# Patient Record
Sex: Female | Born: 1945
Health system: Southern US, Community
[De-identification: ages and names within clinical notes are randomized; demographics above are authoritative.]

## PROBLEM LIST (undated history)

## (undated) DIAGNOSIS — M1990 Unspecified osteoarthritis, unspecified site: Secondary | ICD-10-CM

## (undated) DIAGNOSIS — J439 Emphysema, unspecified: Secondary | ICD-10-CM

## (undated) DIAGNOSIS — T7840XA Allergy, unspecified, initial encounter: Secondary | ICD-10-CM

## (undated) DIAGNOSIS — K219 Gastro-esophageal reflux disease without esophagitis: Secondary | ICD-10-CM

## (undated) DIAGNOSIS — C801 Malignant (primary) neoplasm, unspecified: Secondary | ICD-10-CM

## (undated) DIAGNOSIS — D649 Anemia, unspecified: Secondary | ICD-10-CM

## (undated) DIAGNOSIS — E785 Hyperlipidemia, unspecified: Secondary | ICD-10-CM

## (undated) DIAGNOSIS — I10 Essential (primary) hypertension: Secondary | ICD-10-CM

## (undated) HISTORY — DX: Emphysema, unspecified: J43.9

## (undated) HISTORY — DX: Allergy, unspecified, initial encounter: T78.40XA

## (undated) HISTORY — DX: Essential (primary) hypertension: I10

## (undated) HISTORY — DX: Hyperlipidemia, unspecified: E78.5

## (undated) HISTORY — PX: COLONOSCOPY: SHX174

## (undated) HISTORY — DX: Gastro-esophageal reflux disease without esophagitis: K21.9

## (undated) HISTORY — PX: OTHER SURGICAL HISTORY: SHX169

---

## 2005-10-19 ENCOUNTER — Other Ambulatory Visit: Payer: Self-pay

## 2005-10-19 ENCOUNTER — Emergency Department: Payer: Self-pay | Admitting: Emergency Medicine

## 2005-10-30 ENCOUNTER — Ambulatory Visit: Payer: Self-pay | Admitting: Family Medicine

## 2005-11-25 ENCOUNTER — Ambulatory Visit: Payer: Self-pay

## 2005-12-10 ENCOUNTER — Ambulatory Visit: Payer: Self-pay | Admitting: Family Medicine

## 2005-12-26 ENCOUNTER — Ambulatory Visit: Payer: Self-pay | Admitting: Family Medicine

## 2006-01-24 ENCOUNTER — Ambulatory Visit: Payer: Self-pay | Admitting: Unknown Physician Specialty

## 2006-06-06 ENCOUNTER — Ambulatory Visit: Payer: Self-pay | Admitting: Unknown Physician Specialty

## 2006-06-26 ENCOUNTER — Ambulatory Visit: Payer: Self-pay | Admitting: Unknown Physician Specialty

## 2007-06-28 ENCOUNTER — Ambulatory Visit: Payer: Self-pay | Admitting: Family Medicine

## 2007-09-06 ENCOUNTER — Emergency Department: Payer: Self-pay | Admitting: Emergency Medicine

## 2007-09-06 ENCOUNTER — Other Ambulatory Visit: Payer: Self-pay

## 2011-12-22 LAB — HM COLONOSCOPY: HM COLON: NORMAL

## 2012-02-03 ENCOUNTER — Ambulatory Visit: Payer: Self-pay | Admitting: Family Medicine

## 2012-03-22 LAB — HM PAP SMEAR: HM PAP: NORMAL

## 2012-03-22 LAB — HM MAMMOGRAPHY: HM MAMMO: NORMAL

## 2012-03-24 ENCOUNTER — Ambulatory Visit: Payer: Self-pay | Admitting: Unknown Physician Specialty

## 2013-05-31 ENCOUNTER — Ambulatory Visit: Payer: Self-pay | Admitting: Otolaryngology

## 2013-12-13 ENCOUNTER — Ambulatory Visit: Payer: Self-pay | Admitting: Family Medicine

## 2014-10-18 ENCOUNTER — Other Ambulatory Visit: Payer: Self-pay | Admitting: Family Medicine

## 2014-11-16 ENCOUNTER — Other Ambulatory Visit: Payer: Self-pay | Admitting: Family Medicine

## 2014-11-21 ENCOUNTER — Other Ambulatory Visit: Payer: Self-pay | Admitting: Family Medicine

## 2015-03-08 ENCOUNTER — Telehealth: Payer: Self-pay | Admitting: Family Medicine

## 2015-03-08 NOTE — Telephone Encounter (Signed)
Pt called need a referral to  [facility]   Dr  Portfolio   Appt. Wednesday   23rd at  8:00

## 2015-03-08 NOTE — Telephone Encounter (Signed)
2 referral entered 1 for today [facility]) and 1 for [place] on 03/15/15 auth # [id number]

## 2015-03-08 NOTE — Telephone Encounter (Signed)
Pt states she need a referral for today for  [employer]  @ 1:45  Today. Pt call back # is  [phone]

## 2015-03-08 NOTE — Telephone Encounter (Signed)
Referral done

## 2015-04-28 ENCOUNTER — Encounter: Payer: Self-pay | Admitting: Family Medicine

## 2015-04-28 ENCOUNTER — Ambulatory Visit (INDEPENDENT_AMBULATORY_CARE_PROVIDER_SITE_OTHER): Payer: Self-pay | Admitting: Family Medicine

## 2015-04-28 VITALS — BP 138/74 | HR 80 | Temp 97.6°F | Resp 16 | Ht 64.0 in | Wt 132.0 lb

## 2015-04-28 DIAGNOSIS — K219 Gastro-esophageal reflux disease without esophagitis: Secondary | ICD-10-CM | POA: Insufficient documentation

## 2015-04-28 DIAGNOSIS — R1011 Right upper quadrant pain: Secondary | ICD-10-CM

## 2015-04-28 DIAGNOSIS — R1013 Epigastric pain: Secondary | ICD-10-CM

## 2015-04-28 MED ORDER — DICYCLOMINE HCL 20 MG PO TABS: 20.0000 mg | ORAL_TABLET | Freq: Three times a day (TID) | ORAL | Status: DC

## 2015-04-28 MED ORDER — RANITIDINE HCL 150 MG PO CAPS: 150.0000 mg | ORAL_CAPSULE | Freq: Two times a day (BID) | ORAL | Status: DC

## 2015-04-28 NOTE — Patient Instructions (Signed)
I think your stomach cramps might be IBS related. Let's try Bentyl for your pain. Start with 1 at bedtime. It might make you sleepy.   Let's also try increasing your acid reflux medication. Take Zantac twice daily.   If you have severe abdominal pain, nausea, vomiting, or chest pain.

## 2015-04-28 NOTE — Assessment & Plan Note (Signed)
Likely IBS. R/o Gallbladder or other issues. Bentyl PRN. CMET, CBC, amylase, lipase.  Alarm symptoms reviewed. RTC 3-4 weeks.
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
## 2015-04-28 NOTE — Assessment & Plan Note (Signed)
GERD vs. IBS related pain. R/o cardiac with EKG. Add Zantac BID for GERD coverage. Check CMET, CBC.

## 2015-04-29 LAB — CBC WITH DIFFERENTIAL/PLATELET
BASOS ABS: 0.1 10*3/uL (ref 0.0–0.2)
Basos: 1 %
EOS (ABSOLUTE): 0.1 10*3/uL (ref 0.0–0.4)
Eos: 1 %
Hematocrit: 42.8 % (ref 34.0–46.6)
Hemoglobin: 14.1 g/dL (ref 11.1–15.9)
IMMATURE GRANULOCYTES: 0 %
Immature Grans (Abs): 0 10*3/uL (ref 0.0–0.1)
Lymphocytes Absolute: 2.5 10*3/uL (ref 0.7–3.1)
Lymphs: 22 %
MCH: 30.3 pg (ref 26.6–33.0)
MCHC: 32.9 g/dL (ref 31.5–35.7)
MCV: 92 fL (ref 79–97)
MONOCYTES: 6 %
MONOS ABS: 0.7 10*3/uL (ref 0.1–0.9)
Neutrophils Absolute: 8.1 10*3/uL — ABNORMAL HIGH (ref 1.4–7.0)
Neutrophils: 70 %
Platelets: 649 10*3/uL — ABNORMAL HIGH (ref 150–379)
RBC: 4.65 x10E6/uL (ref 3.77–5.28)
RDW: 14.2 % (ref 12.3–15.4)
WBC: 11.4 10*3/uL — ABNORMAL HIGH (ref 3.4–10.8)

## 2015-04-29 LAB — COMPREHENSIVE METABOLIC PANEL
ALBUMIN: 4.4 g/dL (ref 3.6–4.8)
ALT: 15 IU/L (ref 0–32)
AST: 22 IU/L (ref 0–40)
Albumin/Globulin Ratio: 1.8 (ref 1.1–2.5)
Alkaline Phosphatase: 138 IU/L — ABNORMAL HIGH (ref 39–117)
BUN / CREAT RATIO: 11 (ref 11–26)
BUN: 9 mg/dL (ref 8–27)
Bilirubin Total: 0.6 mg/dL (ref 0.0–1.2)
CALCIUM: 9.3 mg/dL (ref 8.7–10.3)
CO2: 25 mmol/L (ref 18–29)
CREATININE: 0.82 mg/dL (ref 0.57–1.00)
Chloride: 102 mmol/L (ref 96–106)
GFR, EST AFRICAN AMERICAN: 85 mL/min/{1.73_m2} (ref >59)
GFR, EST NON AFRICAN AMERICAN: 74 mL/min/{1.73_m2} (ref >59)
GLUCOSE: 84 mg/dL (ref 65–99)
Globulin, Total: 2.4 g/dL (ref 1.5–4.5)
Potassium: 5.1 mmol/L (ref 3.5–5.2)
Sodium: 146 mmol/L — ABNORMAL HIGH (ref 134–144)
TOTAL PROTEIN: 6.8 g/dL (ref 6.0–8.5)

## 2015-04-29 LAB — AMYLASE: AMYLASE: 73 U/L (ref 31–124)

## 2015-04-29 LAB — LIPASE: Lipase: 25 U/L (ref 0–59)

## 2015-05-02 ENCOUNTER — Telehealth: Payer: Self-pay | Admitting: Family Medicine

## 2015-05-02 DIAGNOSIS — R748 Abnormal levels of other serum enzymes: Secondary | ICD-10-CM
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
## 2015-05-05 LAB — SPECIMEN STATUS REPORT

## 2015-05-05 LAB — GAMMA GT: GGT: 12 IU/L (ref 0–60)

## 2015-05-15 ENCOUNTER — Telehealth: Payer: Self-pay | Admitting: Family Medicine

## 2015-05-15 DIAGNOSIS — R109 Unspecified abdominal pain: Secondary | ICD-10-CM

## 2015-05-15 DIAGNOSIS — R197 Diarrhea, unspecified: Secondary | ICD-10-CM

## 2015-05-15 NOTE — Telephone Encounter (Signed)
Spoke with patient. We will refer her to [facility] gastro. Bentyl showed mild improvement in stomach pains but she has noticed an increase over past few days and stools have become loose. Due to husband's recent death of colon cancer, she would like to get it checked out.

## 2015-05-15 NOTE — Telephone Encounter (Signed)
Pt.called states that the medications that was given to her at her last appt. For stomach pain was not working, she wanted to know if you would do a referral to a GI Dr., pt also states that she have had an upset stomach for 3-4 days. Pt call back # is  [phone]

## 2015-05-23 ENCOUNTER — Ambulatory Visit (INDEPENDENT_AMBULATORY_CARE_PROVIDER_SITE_OTHER): Payer: Commercial Managed Care - HMO | Admitting: Family Medicine

## 2015-05-23 VITALS — BP 137/86 | HR 85 | Temp 98.7°F | Resp 16 | Ht 64.0 in | Wt 131.0 lb

## 2015-05-23 DIAGNOSIS — D72829 Elevated white blood cell count, unspecified: Secondary | ICD-10-CM

## 2015-05-23 DIAGNOSIS — J0110 Acute frontal sinusitis, unspecified: Secondary | ICD-10-CM

## 2015-05-23 DIAGNOSIS — J069 Acute upper respiratory infection, unspecified: Secondary | ICD-10-CM

## 2015-05-23 DIAGNOSIS — R7401 Elevation of levels of liver transaminase levels: Secondary | ICD-10-CM

## 2015-05-23 DIAGNOSIS — R740 Nonspecific elevation of levels of transaminase and lactic acid dehydrogenase [LDH]: Secondary | ICD-10-CM

## 2015-05-23 MED ORDER — DM-GUAIFENESIN ER 30-600 MG PO TB12: 1.0000 | ORAL_TABLET | Freq: Two times a day (BID) | ORAL | Status: DC

## 2015-05-23 MED ORDER — BENZONATATE 100 MG PO CAPS: 100.0000 mg | ORAL_CAPSULE | Freq: Three times a day (TID) | ORAL | Status: DC | PRN

## 2015-05-23 MED ORDER — OXYMETAZOLINE HCL 0.05 % NA SOLN: 1.0000 | Freq: Two times a day (BID) | NASAL | Status: DC

## 2015-05-23 MED ORDER — SALINE SPRAY 0.65 % NA SOLN: 1.0000 | NASAL | Status: DC | PRN

## 2015-05-23 MED ORDER — AMOXICILLIN-POT CLAVULANATE 875-125 MG PO TABS: 1.0000 | ORAL_TABLET | Freq: Two times a day (BID) | ORAL | Status: DC

## 2015-05-23 NOTE — Patient Instructions (Addendum)
Your symptoms are consistent with a viral upper respiratory infection. At this time there is no need for antibiotics.  If your symptoms persist for > 10 days or get better and than worsen please let me know. You may have a secondary bacterial infection. Please fill prescription for Augmentin on Saturday if your symptoms do not improve or if severe symptoms continue through Thursday.   You can use supportive care at home to help with your symptoms. I have sent Mucinex DM to your pharmacy to help break up the congestion and soothe your cough. You can takes this twice daily.  I have also sent tesslon perles to your pharmacy to help with the cough- you can take these 3 times daily as needed. Honey is a natural cough suppressant- so add it to your tea in the morning.  If you have a humidifer, set that up in your bedroom at night.
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
## 2015-05-29 ENCOUNTER — Ambulatory Visit: Payer: Self-pay | Admitting: Family Medicine

## 2015-05-30 LAB — HEPATIC FUNCTION PANEL
ALT: 13 IU/L (ref 0–32)
AST: 17 IU/L (ref 0–40)
Albumin: 3.7 g/dL (ref 3.6–4.8)
Alkaline Phosphatase: 133 IU/L — ABNORMAL HIGH (ref 39–117)
Bilirubin Total: <0.2 mg/dL (ref 0.0–1.2)
Bilirubin, Direct: 0.08 mg/dL (ref 0.00–0.40)
Total Protein: 6.4 g/dL (ref 6.0–8.5)

## 2015-05-30 LAB — CBC WITH DIFFERENTIAL/PLATELET
Basophils Absolute: 0.1 10*3/uL (ref 0.0–0.2)
Basos: 1 %
EOS (ABSOLUTE): 0.3 10*3/uL (ref 0.0–0.4)
EOS: 3 %
HEMATOCRIT: 39 % (ref 34.0–46.6)
Hemoglobin: 13 g/dL (ref 11.1–15.9)
IMMATURE GRANULOCYTES: 0 %
Immature Grans (Abs): 0 10*3/uL (ref 0.0–0.1)
LYMPHS: 37 %
Lymphocytes Absolute: 3.5 10*3/uL — ABNORMAL HIGH (ref 0.7–3.1)
MCH: 30 pg (ref 26.6–33.0)
MCHC: 33.3 g/dL (ref 31.5–35.7)
MCV: 90 fL (ref 79–97)
MONOS ABS: 0.7 10*3/uL (ref 0.1–0.9)
Monocytes: 7 %
NEUTROS PCT: 52 %
Neutrophils Absolute: 5.1 10*3/uL (ref 1.4–7.0)
Platelets: 684 10*3/uL — ABNORMAL HIGH (ref 150–379)
RBC: 4.34 x10E6/uL (ref 3.77–5.28)
RDW: 13.6 % (ref 12.3–15.4)
WBC: 9.6 10*3/uL (ref 3.4–10.8)

## 2015-06-01 ENCOUNTER — Telehealth: Payer: Self-pay | Admitting: Family Medicine

## 2015-06-01 NOTE — Telephone Encounter (Signed)
Add-on iron studies for elevated platelet count. Dx code D47.3 for thrombocytosis.

## 2015-06-07 LAB — IRON AND TIBC
Iron Saturation: 25 % (ref 15–55)
Iron: 60 ug/dL (ref 27–139)
TIBC: 237 ug/dL — AB (ref 250–450)
UIBC: 177 ug/dL (ref 118–369)

## 2015-06-07 LAB — FERRITIN: FERRITIN: 136 ng/mL (ref 15–150)

## 2015-06-07 LAB — RETICULOCYTES: Retic Ct Pct: 1.5 % (ref 0.6–2.6)

## 2015-06-07 LAB — SPECIMEN STATUS REPORT

## 2015-06-11 ENCOUNTER — Other Ambulatory Visit: Payer: Self-pay | Admitting: Family Medicine

## 2015-06-15 ENCOUNTER — Ambulatory Visit (INDEPENDENT_AMBULATORY_CARE_PROVIDER_SITE_OTHER): Payer: Commercial Managed Care - HMO | Admitting: Gastroenterology

## 2015-06-15 ENCOUNTER — Encounter: Payer: Self-pay | Admitting: Gastroenterology

## 2015-06-15 ENCOUNTER — Encounter (INDEPENDENT_AMBULATORY_CARE_PROVIDER_SITE_OTHER): Payer: Self-pay

## 2015-06-15 VITALS — BP 121/74 | HR 72 | Temp 98.3°F | Ht 64.0 in | Wt 136.0 lb

## 2015-06-15 DIAGNOSIS — K580 Irritable bowel syndrome with diarrhea: Secondary | ICD-10-CM | POA: Diagnosis not present

## 2015-06-15 NOTE — Progress Notes (Signed)
Gastroenterology Consultation  Referring Provider:     Luciana Axe, NP Primary Care Physician:  Leata Mouse, NP Primary Gastroenterologist:  Dr. Allen Norris     Reason for Consultation:     Abdominal pain        HPI:   Tiffany Richardson is a 70 y.o. y/o female referred for consultation & management of  Abdominal pain by Dr. Leata Mouse, NP.   This patient comes in today with a history of abdominal pain. She reports the pain to be on both sides and cramping pain. The patient also reports that she has diarrhea and excessive gas. She reports this is more common with certain foods she eats. The patient states that it has gotten much worse since she lost her husband to colon cancer. The patient readily cries in my office when discussing her stress level and her recent loss of her husband. She states that her abdominal pains usually occur when she is thinking about her husband or under stress. The pain typically goes away when she is preoccupied and not having thoughts about her husband and stress. She also reports that she has a daughter who is suffering from non-Hodgkin's lymphoma. There is no report of any black stools or bloody stools. The patient has had a history of colon polyps in the past but her most recent colonoscopy was three years ago and was normal. She denies any nausea or vomiting.  Past Medical History  Diagnosis Date  . Hypertension   . Emphysema of lung ([place])   . Allergy     Past Surgical History  Procedure Laterality Date  . Basal cell removed      Rt eye    Prior to Admission medications   Medication Sig Start Date End Date Taking? Authorizing Provider  dicyclomine (BENTYL) 20 MG tablet Take 1 tablet (20 mg total) by mouth 3 (three) times daily before meals. 04/28/15  Yes Amy Overton Mam, NP  fluticasone (FLONASE) 50 MCG/ACT nasal spray USE 2 SPRAYS IN EACH NOSTRIL EVERY DAY AS NEEDED (NEED A FOLLOW UP APPOINTMENT)  06/12/15  Yes Arlis Porta., MD  metoprolol succinate  (TOPROL-XL) 50 MG 24 hr tablet TAKE 1 TABLET EVERY DAY 06/12/15  Yes Arlis Porta., MD  omeprazole (PRILOSEC) 20 MG capsule TAKE 1 CAPSULE TWICE DAILY 10/18/14  Yes Arlis Porta., MD  oxymetazoline [facility] NASAL SPRAY) 0.05 % nasal spray Place 1 spray into both nostrils 2 (two) times daily. Use for 3 days only. 05/23/15  Yes Amy Overton Mam, NP  ranitidine (ZANTAC) 150 MG capsule Take 1 capsule (150 mg total) by mouth 2 (two) times daily. 04/28/15  Yes Amy Overton Mam, NP  sodium chloride (OCEAN) 0.65 % SOLN nasal spray Place 1 spray into both nostrils as needed for congestion. 05/23/15  Yes Amy Overton Mam, NP  amoxicillin-clavulanate (AUGMENTIN) 875-125 MG tablet Take 1 tablet by mouth 2 (two) times daily. Patient not taking: Reported on 06/15/2015 05/23/15   Amy Overton Mam, NP  benzonatate (TESSALON) 100 MG capsule Take 1 capsule (100 mg total) by mouth 3 (three) times daily as needed. Patient not taking: Reported on 06/15/2015 05/23/15   Amy Overton Mam, NP  dextromethorphan-guaiFENesin (MUCINEX DM) 30-600 MG 12hr tablet Take 1 tablet by mouth 2 (two) times daily. Patient not taking: Reported on 06/15/2015 05/23/15   Amy Overton Mam, NP    History reviewed. No pertinent family history.   Social History  Substance Use  Topics  . Smoking status: Never Smoker   . Smokeless tobacco: Never Used  . Alcohol Use: No    Allergies as of 06/15/2015 - Review Complete 06/15/2015  Allergen Reaction Noted  . Codeine  04/28/2015    Review of Systems:    All systems reviewed and negative except where noted in HPI.   Physical Exam:  BP 121/74 mmHg  Pulse 72  Temp(Src) 98.3 F (36.8 C) (Oral)  Ht 5\' 4"  (1.626 m)  Wt 136 lb (61.689 kg)  BMI 23.33 kg/m2 No LMP recorded. Psych:  Alert and cooperative. Normal mood and affect. General:   Alert,  Well-developed, well-nourished, pleasant and cooperative in NAD Head:  Normocephalic and atraumatic. Eyes:  Sclera clear, no icterus.    Conjunctiva pink. Ears:  Normal auditory acuity. Nose:  No deformity, discharge, or lesions. Mouth:  No deformity or lesions,oropharynx pink & moist. Neck:  Supple; no masses or thyromegaly. Lungs:  Respirations even and unlabored.  Clear throughout to auscultation.   No wheezes, crackles, or rhonchi. No acute distress. Heart:  Regular rate and rhythm; no murmurs, clicks, rubs, or gallops. Abdomen:  Normal bowel sounds.  No bruits.  Soft, non-tender and non-distended without masses, hepatosplenomegaly or hernias noted.  No guarding or rebound tenderness.  Negative Carnett sign.   Rectal:  Deferred.  Msk:  Symmetrical without gross deformities.  Good, equal movement & strength bilaterally. Pulses:  Normal pulses noted. Extremities:  No clubbing or edema.  No cyanosis. Neurologic:  Alert and oriented x3;  grossly normal neurologically. Skin:  Intact without significant lesions or rashes.  No jaundice. Lymph Nodes:  No significant cervical adenopathy. Psych:  Alert and cooperative. Normal mood and affect.  Imaging Studies: No results found.  Assessment and Plan:   Tiffany Richardson is a 70 y.o. y/o female who comes in today with symptoms of irritable bowel syndrome. The patient has no worry symptoms suggestive of any other pathology. The patient reports her symptoms to be related to her stress level and I believe the patient may benefit from and anxiolytic. The patient has been told to contact her primary care physician discussed this with them. The patient has also been told that she can take her dicyclomine up to four times a day. The patient will contact me if her symptoms do not improve or get worse. The patient has been explained the plan and agrees with it.   Note: This dictation was prepared with Dragon dictation along with smaller phrase technology. Any transcriptional errors that result from this process are unintentional.

## 2015-07-17 ENCOUNTER — Other Ambulatory Visit: Payer: Self-pay | Admitting: Family Medicine

## 2015-07-17 DIAGNOSIS — Z1231 Encounter for screening mammogram for malignant neoplasm of breast: Secondary | ICD-10-CM

## 2015-07-25 ENCOUNTER — Ambulatory Visit
Admission: RE | Admit: 2015-07-25 | Discharge: 2015-07-25 | Disposition: A | Discharge status: Home / Self Care | Payer: Commercial Managed Care - HMO | Source: Ambulatory Visit | Attending: Family Medicine | Admitting: Family Medicine

## 2015-07-25 DIAGNOSIS — Z1231 Encounter for screening mammogram for malignant neoplasm of breast: Secondary | ICD-10-CM

## 2015-07-25 HISTORY — DX: Malignant (primary) neoplasm, unspecified: C80.1

## 2015-08-24 ENCOUNTER — Telehealth: Payer: Self-pay | Admitting: Family Medicine

## 2015-08-24 NOTE — Telephone Encounter (Signed)
Tiffany Richardson with [facility] needs a [employer] referral to see Dr. Ishmael Holter today for ICD 10 code [phone].  Please fax it to [phone].  Her call back number is [phone]

## 2015-08-24 NOTE — Telephone Encounter (Signed)
Done and faxed

## 2015-08-28 ENCOUNTER — Other Ambulatory Visit: Payer: Self-pay | Admitting: Family Medicine

## 2015-10-30 ENCOUNTER — Other Ambulatory Visit: Payer: Self-pay | Admitting: Family Medicine

## 2015-10-30 NOTE — Telephone Encounter (Signed)
Send this note to Amy for her approval-jh

## 2015-10-30 NOTE — Telephone Encounter (Signed)
Pt called requesting a refill on Toprol 50mg -Xl  ----  [facility]  [employer]. Call back  # is  [phone]

## 2015-10-31 ENCOUNTER — Other Ambulatory Visit: Payer: Self-pay | Admitting: Family Medicine

## 2015-10-31 MED ORDER — METOPROLOL SUCCINATE ER 50 MG PO TB24: 50.0000 mg | ORAL_TABLET | Freq: Every day | ORAL | Status: DC

## 2015-10-31 NOTE — Telephone Encounter (Signed)
Patient aware.Tiffany Richardson 

## 2015-10-31 NOTE — Telephone Encounter (Signed)
Ms. Sholar is a Luan Pulling patient who I have seen for 2 acute visits only. I am not sure why this was sent to me. I am happy to approve the refill if [facility] is unable to do so. Thanks! AK

## 2015-10-31 NOTE — Telephone Encounter (Signed)
Done for 1 month.  Needs f/u appt.-jh

## 2015-12-06 ENCOUNTER — Telehealth: Payer: Self-pay | Admitting: Family Medicine

## 2015-12-06 NOTE — Telephone Encounter (Signed)
Pt needs a [employer] referral to be seen at [facility] to see Dr. Joana Reamer for follow up for basal cell removal.  Her call back number is [phone]

## 2015-12-07 NOTE — Telephone Encounter (Signed)
Referral will be entered. Appt 12/12/15 NPI [id number] DX: Z01.00

## 2015-12-08 ENCOUNTER — Telehealth: Payer: Self-pay | Admitting: *Deleted

## 2015-12-08 NOTE — Telephone Encounter (Signed)
[place] [id number] Valid 12/12/15-06/09/16 NPI: [id number] DX: Z01.00

## 2015-12-11 ENCOUNTER — Encounter: Payer: Self-pay | Admitting: Family Medicine

## 2015-12-11 ENCOUNTER — Other Ambulatory Visit: Payer: Self-pay | Admitting: Family Medicine

## 2015-12-11 ENCOUNTER — Ambulatory Visit (INDEPENDENT_AMBULATORY_CARE_PROVIDER_SITE_OTHER): Payer: Commercial Managed Care - HMO | Admitting: Family Medicine

## 2015-12-11 VITALS — BP 127/66 | HR 69 | Temp 98.1°F | Resp 16 | Ht 64.0 in | Wt 139.0 lb

## 2015-12-11 DIAGNOSIS — D473 Essential (hemorrhagic) thrombocythemia: Secondary | ICD-10-CM | POA: Diagnosis not present

## 2015-12-11 DIAGNOSIS — I10 Essential (primary) hypertension: Secondary | ICD-10-CM | POA: Diagnosis not present

## 2015-12-11 DIAGNOSIS — K589 Irritable bowel syndrome without diarrhea: Secondary | ICD-10-CM | POA: Insufficient documentation

## 2015-12-11 DIAGNOSIS — K219 Gastro-esophageal reflux disease without esophagitis: Secondary | ICD-10-CM | POA: Diagnosis not present

## 2015-12-11 DIAGNOSIS — R7989 Other specified abnormal findings of blood chemistry: Secondary | ICD-10-CM

## 2015-12-11 DIAGNOSIS — R1011 Right upper quadrant pain: Secondary | ICD-10-CM | POA: Diagnosis not present

## 2015-12-11 LAB — COMPLETE METABOLIC PANEL WITH GFR
ALT: 9 U/L (ref 6–29)
AST: 18 U/L (ref 10–35)
Albumin: 4 g/dL (ref 3.6–5.1)
Alkaline Phosphatase: 114 U/L (ref 33–130)
BUN: 13 mg/dL (ref 7–25)
CALCIUM: 9 mg/dL (ref 8.6–10.4)
CHLORIDE: 104 mmol/L (ref 98–110)
CO2: 28 mmol/L (ref 20–31)
Creat: 0.92 mg/dL (ref 0.50–0.99)
GFR, EST AFRICAN AMERICAN: 74 mL/min (ref >=60)
GFR, Est Non African American: 64 mL/min (ref >=60)
Glucose, Bld: 77 mg/dL (ref 65–99)
POTASSIUM: 5.1 mmol/L (ref 3.5–5.3)
Sodium: 140 mmol/L (ref 135–146)
Total Bilirubin: 0.3 mg/dL (ref 0.2–1.2)
Total Protein: 6.4 g/dL (ref 6.1–8.1)

## 2015-12-11 LAB — CBC WITH DIFFERENTIAL/PLATELET
BASOS PCT: 1 %
Basophils Absolute: 88 cells/uL (ref 0–200)
EOS ABS: 264 {cells}/uL (ref 15–500)
Eosinophils Relative: 3 %
HEMATOCRIT: 40.6 % (ref 35.0–45.0)
HEMOGLOBIN: 13.1 g/dL (ref 11.7–15.5)
LYMPHS ABS: 2992 {cells}/uL (ref 850–3900)
Lymphocytes Relative: 34 %
MCH: 29.2 pg (ref 27.0–33.0)
MCHC: 32.3 g/dL (ref 32.0–36.0)
MCV: 90.6 fL (ref 80.0–100.0)
MONO ABS: 704 {cells}/uL (ref 200–950)
MPV: 9.9 fL (ref 7.5–12.5)
Monocytes Relative: 8 %
Neutro Abs: 4752 cells/uL (ref 1500–7800)
Neutrophils Relative %: 54 %
Platelets: 560 10*3/uL — ABNORMAL HIGH (ref 140–400)
RBC: 4.48 MIL/uL (ref 3.80–5.10)
RDW: 14.6 % (ref 11.0–15.0)
WBC: 8.8 10*3/uL (ref 3.8–10.8)

## 2015-12-11 MED ORDER — METOPROLOL SUCCINATE ER 50 MG PO TB24: 50.0000 mg | ORAL_TABLET | Freq: Every day | ORAL | 3 refills | Status: DC

## 2015-12-11 MED ORDER — CULTURELLE DIGESTIVE HEALTH PO CAPS: 1.0000 | ORAL_CAPSULE | Freq: Every day | ORAL | Status: DC

## 2015-12-11 MED ORDER — DICYCLOMINE HCL 20 MG PO TABS: 20.0000 mg | ORAL_TABLET | Freq: Three times a day (TID) | ORAL | 3 refills | Status: DC

## 2015-12-11 MED ORDER — RANITIDINE HCL 150 MG PO CAPS: 150.0000 mg | ORAL_CAPSULE | Freq: Two times a day (BID) | ORAL | 3 refills | Status: DC
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
## 2015-12-11 NOTE — Assessment & Plan Note (Signed)
Trial of eliminating fodmaps, restarting bentyl, and adding pro-biotic to help with symptoms. Consider adding effexor to help control symptoms if not successful. Follow-up with GI PRN.

## 2015-12-11 NOTE — Assessment & Plan Note (Signed)
Renew metoprolol today. BP well controlled. Continue current regimen.
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
## 2015-12-14 LAB — FERRITIN: FERRITIN: 63 ng/mL (ref 20–288)

## 2015-12-15 LAB — IRON AND TIBC
%SAT: 24 % (ref 11–50)
IRON: 64 ug/dL (ref 45–160)
TIBC: 272 ug/dL (ref 250–450)
UIBC: 208 ug/dL (ref 125–400)

## 2016-01-08 ENCOUNTER — Ambulatory Visit (INDEPENDENT_AMBULATORY_CARE_PROVIDER_SITE_OTHER): Payer: Commercial Managed Care - HMO | Admitting: Family Medicine

## 2016-01-08 ENCOUNTER — Encounter: Payer: Self-pay | Admitting: Family Medicine

## 2016-01-08 VITALS — BP 126/64 | HR 73 | Temp 98.4°F | Resp 16 | Ht 64.0 in | Wt 138.0 lb

## 2016-01-08 DIAGNOSIS — K589 Irritable bowel syndrome without diarrhea: Secondary | ICD-10-CM

## 2016-01-08 DIAGNOSIS — D473 Essential (hemorrhagic) thrombocythemia: Secondary | ICD-10-CM | POA: Diagnosis not present

## 2016-01-08 DIAGNOSIS — H269 Unspecified cataract: Secondary | ICD-10-CM | POA: Diagnosis not present

## 2016-01-08 DIAGNOSIS — J0111 Acute recurrent frontal sinusitis: Secondary | ICD-10-CM | POA: Diagnosis not present

## 2016-01-08 DIAGNOSIS — R7989 Other specified abnormal findings of blood chemistry: Secondary | ICD-10-CM

## 2016-01-08 DIAGNOSIS — D75839 Thrombocytosis, unspecified: Secondary | ICD-10-CM | POA: Insufficient documentation

## 2016-01-08 MED ORDER — AMOXICILLIN-POT CLAVULANATE 875-125 MG PO TABS: 1.0000 | ORAL_TABLET | Freq: Two times a day (BID) | ORAL | 0 refills | Status: DC

## 2016-01-08 MED ORDER — DM-GUAIFENESIN ER 30-600 MG PO TB12: 1.0000 | ORAL_TABLET | Freq: Two times a day (BID) | ORAL | 0 refills | Status: DC

## 2016-01-08 NOTE — Progress Notes (Signed)
Subjective:    Patient ID: Tiffany Richardson, female    DOB: 14-Nov-1945, 70 y.o.   MRN: [id number]  HPI: Tiffany Richardson is a 70 y.o. female presenting on 01/08/2016 for Sinusitis (onset week HA runny nose no fever or chills no sore throat)   HPI  Pt presents for follow-up of IBS symptoms. They are doing better if she watches what she eats. Taking Bentyl as needed. Sinus symptoms started 1.5 weeks ago. Sinus headaches. Ear clogged R side. Frontal sinus pain. No trouble breathing. No chest tightness. Clear congestion with blood streaks. Mild relief with mucinex at home.  Declines flu shot today.   Past Medical History:  Diagnosis Date  . Allergy   . Cancer ([place])    basal cell  . Emphysema of lung ([place])   . Hypertension     Current Outpatient Prescriptions on File Prior to Visit  Medication Sig  . aspirin EC 81 MG tablet Take 81 mg by mouth.  . dicyclomine (BENTYL) 20 MG tablet Take 1 tablet (20 mg total) by mouth 3 (three) times daily before meals.  . fluticasone (FLONASE) 50 MCG/ACT nasal spray USE 2 SPRAYS IN EACH NOSTRIL EVERY DAY AS NEEDED (NEED A FOLLOW UP APPOINTMENT)   . Lactobacillus-Inulin ([place]) CAPS Take 1 capsule by mouth daily.  . metoprolol succinate (TOPROL-XL) 50 MG 24 hr tablet Take 1 tablet (50 mg total) by mouth daily. Take with or immediately following a meal.  . omeprazole (PRILOSEC) 20 MG capsule TAKE 1 CAPSULE TWICE DAILY  . ranitidine (ZANTAC) 150 MG capsule Take 1 capsule (150 mg total) by mouth 2 (two) times daily.   No current facility-administered medications on file prior to visit.     Review of Systems  Constitutional: Negative for chills and fever.  HENT: Positive for congestion, postnasal drip and sinus pressure. Negative for ear pain, sneezing and sore throat.   Respiratory: Negative for cough, chest tightness and wheezing.   Cardiovascular: Negative for chest pain and palpitations.  Gastrointestinal: Negative.  Negative for  diarrhea, nausea and vomiting.  Musculoskeletal: Positive for neck pain.  Neurological: Positive for headaches.   Per HPI unless specifically indicated above     Objective:    BP 126/64   Pulse 73   Temp 98.4 F (36.9 C) (Oral)   Resp 16   Ht 5\' 4"  (1.626 m)   Wt 138 lb (62.6 kg)   SpO2 100%   BMI 23.69 kg/m   Wt Readings from Last 3 Encounters:  01/08/16 138 lb (62.6 kg)  12/11/15 139 lb (63 kg)  06/15/15 136 lb (61.7 kg)    Physical Exam  Constitutional: She appears well-developed and well-nourished. No distress.  HENT:  Head: Normocephalic and atraumatic.  Right Ear: Hearing and tympanic membrane normal. Tympanic membrane is not erythematous and not bulging.  Left Ear: Hearing and tympanic membrane normal. Tympanic membrane is not erythematous and not bulging.  Nose: Mucosal edema and rhinorrhea present. No sinus tenderness. Right sinus exhibits frontal sinus tenderness. Right sinus exhibits no maxillary sinus tenderness. Left sinus exhibits no maxillary sinus tenderness and no frontal sinus tenderness.  Mouth/Throat: Uvula is midline and mucous membranes are normal. No uvula swelling. Posterior oropharyngeal erythema present. No posterior oropharyngeal edema.  Neck: Neck supple. No Brudzinski's sign and no Kernig's sign noted.  Cardiovascular: Normal rate, regular rhythm and normal heart sounds.   Pulmonary/Chest: Breath sounds normal. No accessory muscle usage. No tachypnea. No respiratory distress.  Abdominal: Soft.  Bowel sounds are normal. She exhibits no distension and no mass. There is no tenderness. There is no guarding.  Lymphadenopathy:    She has no cervical adenopathy.   Results for orders placed or performed in visit on 12/11/15  Iron and TIBC  Result Value Ref Range   Iron 64 45 - 160 ug/dL   UIBC 208 125 - 400 ug/dL   TIBC 272 250 - 450 ug/dL   %SAT 24 11 - 50 %  Ferritin  Result Value Ref Range   Ferritin 63 20 - 288 ng/mL      Assessment & Plan:     Problem List Items Addressed This Visit      Digestive   IBS (irritable bowel syndrome)    Pt symptoms are stable. She declined to try venlafaxine. Will continue to diet changes. Continue probiotic. PRN bentyl.         Hematopoietic and Hemostatic   Elevated platelet count [facility])    Discussed hematology referral for elevated platelets. Pt has declined at this time. Recommended pt take daily aspirin- she is amenable. Plan to check CBC at next visit- if still elevated >500- pt is amenable to seeing hematology at that time.        Other Visit Diagnoses    Acute recurrent frontal sinusitis    -  Primary   Treat for sinusitis. Augmentin BID. Supportive care at home. Alarm symptoms reviewed. Return if not improving.    Relevant Medications   dextromethorphan-guaiFENesin (MUCINEX DM) 30-600 MG 12hr tablet   amoxicillin-clavulanate (AUGMENTIN) 875-125 MG tablet   Cataract, right eye       Referral placed for [employer] portal.    Relevant Orders   Ambulatory referral to Ophthalmology      Meds ordered this encounter  Medications  . dextromethorphan-guaiFENesin (MUCINEX DM) 30-600 MG 12hr tablet    Sig: Take 1 tablet by mouth 2 (two) times daily.    Dispense:  20 tablet    Refill:  0    Order Specific Question:   Supervising Provider    Answer:   Arlis Porta [phone]  . amoxicillin-clavulanate (AUGMENTIN) 875-125 MG tablet    Sig: Take 1 tablet by mouth 2 (two) times daily.    Dispense:  14 tablet    Refill:  0    Order Specific Question:   Supervising Provider    Answer:   Arlis Porta [id number]      Follow up plan: Return in about 3 months (around 04/08/2016), or if symptoms worsen or fail to improve.

## 2016-01-08 NOTE — Patient Instructions (Addendum)

## 2016-01-08 NOTE — Assessment & Plan Note (Signed)
Pt symptoms are stable. She declined to try venlafaxine. Will continue to diet changes. Continue probiotic. PRN bentyl.

## 2016-01-08 NOTE — Assessment & Plan Note (Signed)
Discussed hematology referral for elevated platelets. Pt has declined at this time. Recommended pt take daily aspirin- she is amenable. Plan to check CBC at next visit- if still elevated >500- pt is amenable to seeing hematology at that time.

## 2016-09-23 DIAGNOSIS — R69 Illness, unspecified: Secondary | ICD-10-CM | POA: Diagnosis not present

## 2016-10-08 DIAGNOSIS — C44321 Squamous cell carcinoma of skin of nose: Secondary | ICD-10-CM | POA: Diagnosis not present

## 2016-10-08 DIAGNOSIS — L570 Actinic keratosis: Secondary | ICD-10-CM | POA: Diagnosis not present

## 2016-10-08 DIAGNOSIS — D485 Neoplasm of uncertain behavior of skin: Secondary | ICD-10-CM | POA: Diagnosis not present

## 2016-10-08 DIAGNOSIS — L821 Other seborrheic keratosis: Secondary | ICD-10-CM | POA: Diagnosis not present

## 2016-10-08 DIAGNOSIS — C44129 Squamous cell carcinoma of skin of left eyelid, including canthus: Secondary | ICD-10-CM | POA: Diagnosis not present

## 2016-10-10 ENCOUNTER — Ambulatory Visit (INDEPENDENT_AMBULATORY_CARE_PROVIDER_SITE_OTHER): Payer: Medicare HMO | Admitting: Nurse Practitioner

## 2016-10-10 ENCOUNTER — Encounter: Payer: Self-pay | Admitting: Nurse Practitioner

## 2016-10-10 VITALS — BP 122/74 | HR 72 | Temp 98.2°F | Resp 16 | Ht 64.0 in | Wt 145.0 lb

## 2016-10-10 DIAGNOSIS — K219 Gastro-esophageal reflux disease without esophagitis: Secondary | ICD-10-CM

## 2016-10-10 DIAGNOSIS — K582 Mixed irritable bowel syndrome: Secondary | ICD-10-CM | POA: Diagnosis not present

## 2016-10-10 DIAGNOSIS — I10 Essential (primary) hypertension: Secondary | ICD-10-CM

## 2016-10-10 MED ORDER — RIFAXIMIN 550 MG PO TABS: 550.0000 mg | ORAL_TABLET | Freq: Three times a day (TID) | ORAL | 0 refills | Status: AC

## 2016-10-10 NOTE — Progress Notes (Signed)
Subjective:    Patient ID: Tiffany Richardson, female    DOB: [birth year], 71 y.o.   MRN: [id number]  Tiffany Richardson is a 71 y.o. female presenting on 10/10/2016 for Hypertension  HPI Hypertension   Pt does check BP at home usually less than 130/80. Pt denies headache, lightheadedness, dizziness, changes in vision, chest tightness/pressure, palpitations, leg swelling, sudden loss of speech or loss of consciousness.  Current medications: Toprol - XL 50 mg once daily, tolerating well without side effects.  Skin Cancer History Squamous cell and basal cell in past> New squamous cell on face to be removed Monday.    Constipation Irritable bowel cramping before bm occasionally, bloating constant,  Intermittent pain, but not predominant.  Takes Citrucel about every other day and helps w/ cramping and constipation.   Social History  Substance Use Topics  . Smoking status: Never Smoker  . Smokeless tobacco: Never Used  . Alcohol use No    Review of Systems Per HPI unless specifically indicated above     Objective:    BP 122/74   Pulse 72   Temp 98.2 F (36.8 C) (Oral)   Resp 16   Ht 5\' 4"  (1.626 m)   Wt 145 lb (65.8 kg)   BMI 24.89 kg/m    Wt Readings from Last 3 Encounters:  10/10/16 145 lb (65.8 kg)  01/08/16 138 lb (62.6 kg)  12/11/15 139 lb (63 kg)    Physical Exam  General - healthy, well-appearing, NAD HEENT - Normocephalic, atraumatic, PERRL, EOMI, patent nares w/o congestion, oropharynx clear, MMM Neck - supple, non-tender, no LAD, no thyromegaly Heart - RRR, bradycardia, no murmurs heard Lungs - Clear throughout all lobes, no wheezing, crackles, or rhonchi. Normal work of breathing. Abdomen - soft, NTND, no masses, no hepatosplenomegaly, active bowel sounds Extremeties - non-tender, no edema, cap refill < 2 seconds, peripheral pulses intact +2 bilaterally Skin - warm, dry, no rashes, squamous cell lesion on left cheek near nose, multiple nevi Neuro - awake, alert,  oriented, CN II-X intact, intact muscle strength 5/5 bilaterally, intact distal sensation to light touch, normal coordination, normal gait Psych - Normal mood and affect, normal behavior   Results for orders placed or performed in visit on 12/11/15  Iron and TIBC  Result Value Ref Range   Iron 64 45 - 160 ug/dL   UIBC 208 125 - 400 ug/dL   TIBC 272 250 - 450 ug/dL   %SAT 24 11 - 50 %  Ferritin  Result Value Ref Range   Ferritin 63 20 - 288 ng/mL      Assessment & Plan:   Problem List Items Addressed This Visit      Cardiovascular and Mediastinum   Hypertension - Primary    Stable.  BP at goal.    Plan: 1. Continue Toprol XL 50 mg once daily.      Relevant Medications   metoprolol succinate (TOPROL-XL) 50 MG 24 hr tablet   Other Relevant Orders   Lipid panel (Completed)   Comprehensive metabolic panel (Completed)     Digestive   Gastroesophageal reflux disease without esophagitis    Pt taking prilosec 20 mg bid.  W/ adequate symptom control of acid reflux.  Plan: 1. Continue Prilosec 20 mg bid.  Side effects of long term use discussed.  Pt declined changing therapy. 2. Check CMP. 3. Follow up 1 year.      Relevant Medications   omeprazole (PRILOSEC) 20 MG capsule   IBS (  irritable bowel syndrome)    Pt has moderately controlled symptoms.  Pt content w/ current level.  States bentyl does not help and has not been taking it.  Plan: 1. Continue Citrucal for bowel regularity. 2. Pt willing to try xifaxan.  Take 1 tablet tid for 3 weeks.  If reduction of symptoms, and future return of symptoms, may repeat. 3. Consider linzess or amitiza in future. 4. Follow up as needed and in 1 year.      Relevant Medications   rifaximin (XIFAXAN) 550 MG TABS tablet   omeprazole (PRILOSEC) 20 MG capsule      Meds ordered this encounter  Medications  . rifaximin (XIFAXAN) 550 MG TABS tablet    Sig: Take 1 tablet (550 mg total) by mouth 3 (three) times daily.    Dispense:  63  tablet    Refill:  0  . metoprolol succinate (TOPROL-XL) 50 MG 24 hr tablet    Sig: Take 1 tablet (50 mg total) by mouth daily. Take with or immediately following a meal.    Dispense:  90 tablet    Refill:  3  . omeprazole (PRILOSEC) 20 MG capsule    Sig: Take 1 capsule (20 mg total) by mouth 2 (two) times daily.    Dispense:  180 capsule    Refill:  3  . fluticasone (FLONASE) 50 MCG/ACT nasal spray    Sig: USE 2 SPRAYS IN EACH NOSTRIL EVERY DAY AS NEEDED    Dispense:  16 g    Refill:  2     Follow up plan: Return in about 1 year (around 10/10/2017) for hypertension or sooner for Irritable Bowel.   [name], DNP, AGPCNP-BC Adult Gerontology Primary Care Nurse Practitioner [facility] 10/15/2016, 10:04 AM

## 2016-10-10 NOTE — Patient Instructions (Addendum)
Tiffany Richardson,  Thank you for coming in to clinic today.  1. Schedule a medicare wellness.  2. For your blood pressure: - CONTINUE your Toprol - XL 50 mg once daily. - Check your BP once weekly.  GOAL is less than 130/80.    3. For your irritable bowel: - Continue your Citrucel once daily to every other day. - Consider taking a probiotic.  Antibiotics kill good and bad bacteria.  A probiotic helps to replace your good bacteria. Probiotic pills can be found over the counter.  One brand is Florastor, but you can use any brand you prefer.  You can also get good bacteria from foods.  These foods are yogurt, kefir, kombucha (fermented tea), and fresh, refrigerated and uncooked sauerkraut or kimchi. Yogurt is usually preferred and is a great option. - START xifaxan 500 mg bid  Please schedule a follow-up appointment with [name], AGNP to Return in about 1 year (around 10/10/2017) for hypertension or sooner for Irritable Bowel.   If you have any other questions or concerns, please feel free to call the clinic or send a message through [place]. You may also schedule an earlier appointment if necessary.  [name], DNP, AGNP-BC Adult Gerontology Nurse Practitioner [place]

## 2016-10-11 ENCOUNTER — Other Ambulatory Visit: Payer: Self-pay

## 2016-10-11 DIAGNOSIS — I10 Essential (primary) hypertension: Secondary | ICD-10-CM

## 2016-10-11 DIAGNOSIS — R748 Abnormal levels of other serum enzymes: Secondary | ICD-10-CM

## 2016-10-11 DIAGNOSIS — E782 Mixed hyperlipidemia: Secondary | ICD-10-CM

## 2016-10-11 DIAGNOSIS — Z13820 Encounter for screening for osteoporosis: Secondary | ICD-10-CM

## 2016-10-11 MED ORDER — OMEPRAZOLE 20 MG PO CPDR: 20.0000 mg | DELAYED_RELEASE_CAPSULE | Freq: Two times a day (BID) | ORAL | 3 refills | Status: DC

## 2016-10-11 MED ORDER — METOPROLOL SUCCINATE ER 50 MG PO TB24: 50.0000 mg | ORAL_TABLET | Freq: Every day | ORAL | 3 refills | Status: DC

## 2016-10-11 MED ORDER — FLUTICASONE PROPIONATE 50 MCG/ACT NA SUSP: NASAL | 2 refills | Status: DC

## 2016-10-12 LAB — COMPREHENSIVE METABOLIC PANEL
ALT: 14 IU/L (ref 0–32)
AST: 23 IU/L (ref 0–40)
Albumin/Globulin Ratio: 1.5 (ref 1.2–2.2)
Albumin: 4.1 g/dL (ref 3.6–4.8)
Alkaline Phosphatase: 146 IU/L — ABNORMAL HIGH (ref 39–117)
BUN/Creatinine Ratio: 14 (ref 12–28)
BUN: 12 mg/dL (ref 8–27)
Bilirubin Total: 0.6 mg/dL (ref 0.0–1.2)
CO2: 25 mmol/L (ref 20–29)
Calcium: 9.2 mg/dL (ref 8.7–10.3)
Chloride: 101 mmol/L (ref 96–106)
Creatinine, Ser: 0.85 mg/dL (ref 0.57–1.00)
GFR calc Af Amer: 81 mL/min/{1.73_m2} (ref >59)
GFR calc non Af Amer: 70 mL/min/{1.73_m2} (ref >59)
Globulin, Total: 2.8 g/dL (ref 1.5–4.5)
Glucose: 74 mg/dL (ref 65–99)
Potassium: 5.3 mmol/L — ABNORMAL HIGH (ref 3.5–5.2)
Sodium: 142 mmol/L (ref 134–144)
Total Protein: 6.9 g/dL (ref 6.0–8.5)

## 2016-10-12 LAB — LIPID PANEL
Chol/HDL Ratio: 5.2 ratio — ABNORMAL HIGH (ref 0.0–4.4)
Cholesterol, Total: 220 mg/dL — ABNORMAL HIGH (ref 100–199)
HDL: 42 mg/dL (ref >39)
LDL Calculated: 142 mg/dL — ABNORMAL HIGH (ref 0–99)
Triglycerides: 179 mg/dL — ABNORMAL HIGH (ref 0–149)
VLDL Cholesterol Cal: 36 mg/dL (ref 5–40)

## 2016-10-13 MED ORDER — ATORVASTATIN CALCIUM 20 MG PO TABS: 20.0000 mg | ORAL_TABLET | Freq: Every day | ORAL | 3 refills | Status: DC

## 2016-10-14 DIAGNOSIS — L578 Other skin changes due to chronic exposure to nonionizing radiation: Secondary | ICD-10-CM | POA: Diagnosis not present

## 2016-10-14 DIAGNOSIS — L908 Other atrophic disorders of skin: Secondary | ICD-10-CM | POA: Diagnosis not present

## 2016-10-14 DIAGNOSIS — C44329 Squamous cell carcinoma of skin of other parts of face: Secondary | ICD-10-CM | POA: Diagnosis not present

## 2016-10-14 DIAGNOSIS — L814 Other melanin hyperpigmentation: Secondary | ICD-10-CM | POA: Diagnosis not present

## 2016-10-15 NOTE — Assessment & Plan Note (Addendum)
Pt has moderately controlled symptoms.  Pt content w/ current level.  States bentyl does not help and has not been taking it.  Plan: 1. Continue Citrucal for bowel regularity. 2. Pt willing to try xifaxan.  Take 1 tablet tid for 3 weeks.  If reduction of symptoms, and future return of symptoms, may repeat. 3. Consider linzess or amitiza in future. 4. Follow up as needed and in 1 year.

## 2016-10-15 NOTE — Assessment & Plan Note (Signed)
Pt taking prilosec 20 mg bid.  W/ adequate symptom control of acid reflux.  Plan: 1. Continue Prilosec 20 mg bid.  Side effects of long term use discussed.  Pt declined changing therapy. 2. Check CMP. 3. Follow up 1 year.

## 2016-10-15 NOTE — Assessment & Plan Note (Signed)
Stable.  BP at goal.    Plan: 1. Continue Toprol XL 50 mg once daily.

## 2016-10-15 NOTE — Progress Notes (Signed)
I have reviewed this encounter including the documentation in this note and/or discussed this patient with the provider, [name], AGPCNP-BC. I am certifying that I agree with the content of this note as supervising physician.  Nobie Putnam, [facility] 10/15/2016, 1:34 PM

## 2016-10-17 ENCOUNTER — Other Ambulatory Visit: Payer: Self-pay

## 2016-10-17 MED ORDER — OMEPRAZOLE 40 MG PO CPDR: 40.0000 mg | DELAYED_RELEASE_CAPSULE | Freq: Every day | ORAL | 3 refills | Status: DC

## 2016-10-28 DIAGNOSIS — L570 Actinic keratosis: Secondary | ICD-10-CM | POA: Diagnosis not present

## 2016-11-18 DIAGNOSIS — L570 Actinic keratosis: Secondary | ICD-10-CM | POA: Diagnosis not present

## 2016-12-25 ENCOUNTER — Other Ambulatory Visit: Payer: Self-pay

## 2016-12-27 MED ORDER — DICYCLOMINE HCL 20 MG PO TABS: 20.0000 mg | ORAL_TABLET | Freq: Four times a day (QID) | ORAL | 3 refills | Status: DC

## 2016-12-30 DIAGNOSIS — L905 Scar conditions and fibrosis of skin: Secondary | ICD-10-CM | POA: Diagnosis not present

## 2016-12-30 DIAGNOSIS — L570 Actinic keratosis: Secondary | ICD-10-CM | POA: Diagnosis not present

## 2017-01-12 ENCOUNTER — Other Ambulatory Visit: Payer: Self-pay | Admitting: Nurse Practitioner

## 2017-06-05 ENCOUNTER — Telehealth: Payer: Self-pay

## 2017-06-05 MED ORDER — FLUTICASONE PROPIONATE 50 MCG/ACT NA SUSP: NASAL | 0 refills | Status: DC

## 2017-06-11 NOTE — Telephone Encounter (Signed)
Medication sent to [facility]  

## 2017-06-12 DIAGNOSIS — J0140 Acute pansinusitis, unspecified: Secondary | ICD-10-CM | POA: Diagnosis not present

## 2017-07-01 DIAGNOSIS — L578 Other skin changes due to chronic exposure to nonionizing radiation: Secondary | ICD-10-CM | POA: Diagnosis not present

## 2017-07-01 DIAGNOSIS — L821 Other seborrheic keratosis: Secondary | ICD-10-CM | POA: Diagnosis not present

## 2017-07-01 DIAGNOSIS — Z859 Personal history of malignant neoplasm, unspecified: Secondary | ICD-10-CM | POA: Diagnosis not present

## 2017-07-01 DIAGNOSIS — L570 Actinic keratosis: Secondary | ICD-10-CM | POA: Diagnosis not present

## 2017-08-25 DIAGNOSIS — J0140 Acute pansinusitis, unspecified: Secondary | ICD-10-CM | POA: Diagnosis not present

## 2017-09-06 ENCOUNTER — Other Ambulatory Visit: Payer: Self-pay | Admitting: Nurse Practitioner

## 2017-09-08 ENCOUNTER — Other Ambulatory Visit: Payer: Self-pay | Admitting: Nurse Practitioner

## 2017-09-08 DIAGNOSIS — I10 Essential (primary) hypertension: Secondary | ICD-10-CM

## 2017-09-18 ENCOUNTER — Other Ambulatory Visit: Payer: Self-pay

## 2017-09-18 ENCOUNTER — Encounter: Payer: Self-pay | Admitting: Nurse Practitioner

## 2017-09-18 ENCOUNTER — Ambulatory Visit (INDEPENDENT_AMBULATORY_CARE_PROVIDER_SITE_OTHER): Payer: Medicare HMO | Admitting: Nurse Practitioner

## 2017-09-18 VITALS — BP 144/70 | HR 62 | Temp 97.6°F | Ht 64.0 in | Wt 150.4 lb

## 2017-09-18 DIAGNOSIS — R1084 Generalized abdominal pain: Secondary | ICD-10-CM

## 2017-09-18 DIAGNOSIS — R748 Abnormal levels of other serum enzymes: Secondary | ICD-10-CM

## 2017-09-18 DIAGNOSIS — K580 Irritable bowel syndrome with diarrhea: Secondary | ICD-10-CM

## 2017-09-18 DIAGNOSIS — Z1211 Encounter for screening for malignant neoplasm of colon: Secondary | ICD-10-CM | POA: Diagnosis not present

## 2017-09-18 MED ORDER — DICYCLOMINE HCL 10 MG PO CAPS: 10.0000 mg | ORAL_CAPSULE | Freq: Three times a day (TID) | ORAL | 1 refills | Status: DC

## 2017-09-18 NOTE — Patient Instructions (Addendum)
Tiffany Richardson,   Thank you for coming in to clinic today.  1. Restart your dicyclomine at 10 mg up to four times daily with meals and at bedtime as needed for abdominal cramping and diarrhea.  2. Labs today for evaluation of your medication.  Please schedule a follow-up appointment with [name], AGNP. Return in about 8 weeks (around 11/13/2017) for abdominal pain.  If you have any other questions or concerns, please feel free to call the clinic or send a message through [place]. You may also schedule an earlier appointment if necessary.  You will receive a survey after today's visit either digitally by e-mail or paper by [employer]. Your experiences and feedback matter to [place].  Please respond so we know how we are doing as we provide care for you.   [name], DNP, AGNP-BC Adult Gerontology Nurse Practitioner [place]

## 2017-09-18 NOTE — Progress Notes (Signed)
Subjective:    Patient ID: Tiffany Richardson, female    DOB: 1945/08/17, 72 y.o.   MRN: [id number]  Tiffany Richardson is a 72 y.o. female presenting on 09/18/2017 for Bloated (pt complains of abdominal bloating. she states it's not painful but very uncomfortable. Mostly on the left side, but radiates across the upper abdominal area x 2 weeks )   HPI Abdominal Pain/Bloating X 2 weeks Left and upper abdomen.  Is having BM daily to every 2-3 days, to 2-3 x daily.  This pattern is not unusual for patient.  Symptoms persist with and without food.  Discomfort is described as "feeling like something is going to pop" with mild discomfort.  Patient denies this as pain, pressure, cramping.  Patient continues to use the word bloating to describe the sensation.  Patient denies all GI bleeding symptoms, nausea, vomiting.  Patient has known history of irritable bowel syndrome not currently on medications.  Is taking Citrucel daily for IBS.   - Pt has immediate concern about possibility for colon cancer.  Anxiety about this diagnosis is increased 2/2 husband who died from colon cancer.  Pt has no immediate family with colon cancer, but pt cites possible environmental risk factors that may have increased her risk that are similar to husband's prior exposures.  Pt notes her diet was always healthier than her husband's with more fiber/vegetables.   Social History   Tobacco Use  . Smoking status: Never Smoker  . Smokeless tobacco: Never Used  Substance Use Topics  . Alcohol use: No    Alcohol/week: 0.0 oz  . Drug use: No    Review of Systems Per HPI unless specifically indicated above     Objective:    BP (!) 144/70 (BP Location: Right Arm, Patient Position: Sitting, Cuff Size: Normal)   Pulse 62   Temp 97.6 F (36.4 C) (Oral)   Ht 5\' 4"  (1.626 m)   Wt 150 lb 6.4 oz (68.2 kg)   BMI 25.82 kg/m   Wt Readings from Last 3 Encounters:  09/18/17 150 lb 6.4 oz (68.2 kg)  10/10/16 145 lb (65.8 kg)  01/08/16 138  lb (62.6 kg)    Physical Exam  Constitutional: She is oriented to person, place, and time. She appears well-developed and well-nourished. No distress.  HENT:  Head: Normocephalic and atraumatic.  Cardiovascular: Normal rate, regular rhythm, S1 normal, S2 normal, normal heart sounds and intact distal pulses.  Pulmonary/Chest: Effort normal and breath sounds normal. No respiratory distress.  Abdominal: Soft. Normal appearance. She exhibits no distension, no abdominal bruit and no ascites. Bowel sounds are increased. There is no hepatosplenomegaly. There is no tenderness. No hernia.  Neurological: She is alert and oriented to person, place, and time.  Skin: Skin is warm and dry.  Psychiatric: She has a normal mood and affect. Her behavior is normal.  Vitals reviewed.   Results for orders placed or performed in visit on 10/11/16  Lipid panel  Result Value Ref Range   Cholesterol, Total 220 (H) 100 - 199 mg/dL   Triglycerides 179 (H) 0 - 149 mg/dL   HDL 42 >39 mg/dL   VLDL Cholesterol Cal 36 5 - 40 mg/dL   LDL Calculated 142 (H) 0 - 99 mg/dL   Chol/HDL Ratio 5.2 (H) 0.0 - 4.4 ratio  Comprehensive metabolic panel  Result Value Ref Range   Glucose 74 65 - 99 mg/dL   BUN 12 8 - 27 mg/dL   Creatinine, Ser 0.85 0.57 -  1.00 mg/dL   GFR calc non Af Amer 70 >59 mL/min/1.73   GFR calc Af Amer 81 >59 mL/min/1.73   BUN/Creatinine Ratio 14 12 - 28   Sodium 142 134 - 144 mmol/L   Potassium 5.3 (H) 3.5 - 5.2 mmol/L   Chloride 101 96 - 106 mmol/L   CO2 25 20 - 29 mmol/L   Calcium 9.2 8.7 - 10.3 mg/dL   Total Protein 6.9 6.0 - 8.5 g/dL   Albumin 4.1 3.6 - 4.8 g/dL   Globulin, Total 2.8 1.5 - 4.5 g/dL   Albumin/Globulin Ratio 1.5 1.2 - 2.2   Bilirubin Total 0.6 0.0 - 1.2 mg/dL   Alkaline Phosphatase 146 (H) 39 - 117 IU/L   AST 23 0 - 40 IU/L   ALT 14 0 - 32 IU/L      Assessment & Plan:   Problem List Items Addressed This Visit      Digestive   IBS (irritable bowel syndrome)   Relevant  Medications   dicyclomine (BENTYL) 10 MG capsule    Other Visit Diagnoses    Generalized abdominal pain    -  Primary   Relevant Orders   CBC with Differential/Platelet (Completed)   Elevated alkaline phosphatase level       Relevant Orders   Alkaline Phosphatase Isoenzymes   COMPLETE METABOLIC PANEL WITH GFR (Completed)   CBC with Differential/Platelet (Completed)   Colon cancer screening       Relevant Orders   Ambulatory referral to Gastroenterology    #1 Abdominal pain and bloating: Pt with chronic IBS with diarrhea and constipation that has previously contributed to abdominal symptoms.  Pt with last colonsocopy with Dr. Vira Agar in 2013.  Pt states she was recommended for Q3-5 year followup.  Currently, symptoms and abdominal exam are consistent with IBS.  Pt is not currently using Bentyl.  Plan: 1. Refill bentyl at reduced dose of 10 mg.  Take tid and at bedtime prn. 2.  Labs today 3.  Referral to GI for repeat colonoscopy 4.  Follow-up in 8 weeks for abdominal pain reevaluation  #2 elevated alkaline phosphatase: Patient with history of elevated alkaline phosphatase.  Send alkaline phosphatase isoenzymes.  Follow-up after labs   Meds ordered this encounter  Medications  . dicyclomine (BENTYL) 10 MG capsule    Sig: Take 1 capsule (10 mg total) by mouth 4 (four) times daily -  before meals and at bedtime.    Dispense:  120 capsule    Refill:  1    Order Specific Question:   Supervising Provider    Answer:   Olin Hauser [[id number]]    Follow up plan: Return in about 8 weeks (around 11/13/2017) for abdominal pain.  [name], DNP, AGPCNP-BC Adult Gerontology Primary Care Nurse Practitioner [facility] 09/18/2017, 10:35 AM

## 2017-09-19 ENCOUNTER — Encounter: Payer: Self-pay | Admitting: Nurse Practitioner

## 2017-09-23 LAB — CBC WITH DIFFERENTIAL/PLATELET
Basophils Absolute: 103 cells/uL (ref 0–200)
Basophils Relative: 1.3 %
Eosinophils Absolute: 316 cells/uL (ref 15–500)
Eosinophils Relative: 4 %
HCT: 41.4 % (ref 35.0–45.0)
Hemoglobin: 13.4 g/dL (ref 11.7–15.5)
Lymphs Abs: 2686 cells/uL (ref 850–3900)
MCH: 29.2 pg (ref 27.0–33.0)
MCHC: 32.4 g/dL (ref 32.0–36.0)
MCV: 90.2 fL (ref 80.0–100.0)
MPV: 9.4 fL (ref 7.5–12.5)
Monocytes Relative: 7.6 %
Neutro Abs: 4195 cells/uL (ref 1500–7800)
Neutrophils Relative %: 53.1 %
Platelets: 590 10*3/uL — ABNORMAL HIGH (ref 140–400)
RBC: 4.59 10*6/uL (ref 3.80–5.10)
RDW: 14.5 % (ref 11.0–15.0)
Total Lymphocyte: 34 %
WBC mixed population: 600 cells/uL (ref 200–950)
WBC: 7.9 10*3/uL (ref 3.8–10.8)

## 2017-09-23 LAB — COMPLETE METABOLIC PANEL WITH GFR
AG Ratio: 1.4 (calc) (ref 1.0–2.5)
ALT: 10 U/L (ref 6–29)
AST: 17 U/L (ref 10–35)
Albumin: 3.9 g/dL (ref 3.6–5.1)
Alkaline phosphatase (APISO): 130 U/L (ref 33–130)
BUN: 13 mg/dL (ref 7–25)
CO2: 24 mmol/L (ref 20–32)
Calcium: 9.1 mg/dL (ref 8.6–10.4)
Chloride: 104 mmol/L (ref 98–110)
Creat: 0.87 mg/dL (ref 0.60–0.93)
GFR, Est African American: 78 mL/min/{1.73_m2} (ref >=60)
GFR, Est Non African American: 67 mL/min/{1.73_m2} (ref >=60)
Globulin: 2.7 g/dL (calc) (ref 1.9–3.7)
Glucose, Bld: 80 mg/dL (ref 65–99)
Potassium: 4.8 mmol/L (ref 3.5–5.3)
Sodium: 140 mmol/L (ref 135–146)
Total Bilirubin: 0.5 mg/dL (ref 0.2–1.2)
Total Protein: 6.6 g/dL (ref 6.1–8.1)

## 2017-09-23 LAB — ALKALINE PHOSPHATASE ISOENZYMES
Alkaline phosphatase (APISO): 126 U/L (ref 33–130)
Bone Isoenzymes: 42 % (ref 28–66)
Intestinal Isoenzymes: 4 % (ref 1–24)
Liver Isoenzymes: 54 % (ref 25–69)

## 2017-10-08 ENCOUNTER — Other Ambulatory Visit: Payer: Self-pay | Admitting: Family Medicine

## 2017-10-08 DIAGNOSIS — K219 Gastro-esophageal reflux disease without esophagitis: Secondary | ICD-10-CM

## 2017-10-21 ENCOUNTER — Ambulatory Visit: Payer: Medicare HMO

## 2017-10-21 DIAGNOSIS — Z860101 Personal history of adenomatous and serrated colon polyps: Secondary | ICD-10-CM | POA: Insufficient documentation

## 2017-10-21 DIAGNOSIS — Z86010 Personal history of colonic polyps: Secondary | ICD-10-CM | POA: Diagnosis not present

## 2017-10-21 DIAGNOSIS — K581 Irritable bowel syndrome with constipation: Secondary | ICD-10-CM | POA: Diagnosis not present

## 2017-10-27 ENCOUNTER — Encounter: Payer: Medicare HMO | Admitting: Nurse Practitioner

## 2017-10-31 ENCOUNTER — Telehealth: Payer: Self-pay | Admitting: Nurse Practitioner

## 2017-10-31 DIAGNOSIS — E782 Mixed hyperlipidemia: Secondary | ICD-10-CM

## 2017-10-31 MED ORDER — ATORVASTATIN CALCIUM 20 MG PO TABS: 20.0000 mg | ORAL_TABLET | Freq: Every day | ORAL | 1 refills | Status: DC

## 2017-10-31 NOTE — Addendum Note (Signed)
Addended by: Cleaster Corin on: 10/31/2017 05:28 PM   Modules accepted: Orders

## 2017-10-31 NOTE — Telephone Encounter (Addendum)
She declined an office visit and labs at this time. The pt states she currently got to much going on. Her mother just recently had a stroke at 71 yrs old. This is the reason she thought maybe she should take the cholesterol medication. She will call back and schedule an appointment at a later date.

## 2017-10-31 NOTE — Telephone Encounter (Signed)
Pt called requesting refill on Lipitor 20 mg   [facility]ed into  [facility]  [place]

## 2017-10-31 NOTE — Telephone Encounter (Signed)
Cholesterol was not discussed at last visit.  At that time patient was not taking her atorvastatin by report.  We will need fasting lab for lipid check and a cholesterol office visit to evaluate this problem.    Please schedule with labs prior to appointment.

## 2017-10-31 NOTE — Telephone Encounter (Signed)
Start atorvastatin 20 mg once daily.  Patient now ready and willing to take this as her mother has had a stroke.  Labs and appointment in 6 weeks to allow patient to be present for her mother's recovery.

## 2017-11-13 ENCOUNTER — Ambulatory Visit: Payer: Medicare HMO | Admitting: Nurse Practitioner

## 2017-11-18 ENCOUNTER — Ambulatory Visit: Payer: Medicare HMO | Admitting: Nurse Practitioner

## 2017-11-18 ENCOUNTER — Ambulatory Visit: Payer: Medicare HMO

## 2017-11-22 ENCOUNTER — Other Ambulatory Visit: Payer: Self-pay | Admitting: Nurse Practitioner

## 2017-11-22 DIAGNOSIS — K580 Irritable bowel syndrome with diarrhea: Secondary | ICD-10-CM

## 2017-11-22 DIAGNOSIS — I10 Essential (primary) hypertension: Secondary | ICD-10-CM

## 2017-12-23 DIAGNOSIS — K621 Rectal polyp: Secondary | ICD-10-CM | POA: Diagnosis not present

## 2017-12-23 DIAGNOSIS — D123 Benign neoplasm of transverse colon: Secondary | ICD-10-CM | POA: Diagnosis not present

## 2017-12-23 DIAGNOSIS — Z86010 Personal history of colonic polyps: Secondary | ICD-10-CM | POA: Diagnosis not present

## 2017-12-23 DIAGNOSIS — K640 First degree hemorrhoids: Secondary | ICD-10-CM | POA: Diagnosis not present

## 2017-12-23 DIAGNOSIS — K635 Polyp of colon: Secondary | ICD-10-CM | POA: Diagnosis not present

## 2017-12-23 DIAGNOSIS — Z1211 Encounter for screening for malignant neoplasm of colon: Secondary | ICD-10-CM | POA: Diagnosis not present

## 2018-01-11 ENCOUNTER — Other Ambulatory Visit: Payer: Self-pay | Admitting: Nurse Practitioner

## 2018-02-04 ENCOUNTER — Other Ambulatory Visit: Payer: Self-pay | Admitting: Nurse Practitioner

## 2018-02-23 ENCOUNTER — Other Ambulatory Visit: Payer: Self-pay | Admitting: Family Medicine

## 2018-02-23 DIAGNOSIS — K219 Gastro-esophageal reflux disease without esophagitis: Secondary | ICD-10-CM

## 2018-02-28 ENCOUNTER — Other Ambulatory Visit: Payer: Self-pay | Admitting: Nurse Practitioner

## 2018-03-26 ENCOUNTER — Other Ambulatory Visit: Payer: Self-pay | Admitting: Nurse Practitioner

## 2018-03-26 DIAGNOSIS — J0101 Acute recurrent maxillary sinusitis: Secondary | ICD-10-CM | POA: Diagnosis not present

## 2018-03-26 DIAGNOSIS — J3089 Other allergic rhinitis: Secondary | ICD-10-CM

## 2018-03-27 ENCOUNTER — Other Ambulatory Visit: Payer: Self-pay

## 2018-04-16 ENCOUNTER — Other Ambulatory Visit: Payer: Self-pay | Admitting: Nurse Practitioner

## 2018-04-16 DIAGNOSIS — J3089 Other allergic rhinitis: Secondary | ICD-10-CM

## 2018-05-11 ENCOUNTER — Other Ambulatory Visit: Payer: Self-pay | Admitting: Nurse Practitioner

## 2018-05-11 DIAGNOSIS — J3089 Other allergic rhinitis: Secondary | ICD-10-CM

## 2018-05-16 ENCOUNTER — Other Ambulatory Visit: Payer: Self-pay | Admitting: Nurse Practitioner

## 2018-05-16 DIAGNOSIS — I10 Essential (primary) hypertension: Secondary | ICD-10-CM

## 2018-05-22 ENCOUNTER — Other Ambulatory Visit: Payer: Self-pay | Admitting: Nurse Practitioner

## 2018-05-22 DIAGNOSIS — K219 Gastro-esophageal reflux disease without esophagitis: Secondary | ICD-10-CM

## 2018-06-10 ENCOUNTER — Other Ambulatory Visit: Payer: Self-pay | Admitting: Nurse Practitioner

## 2018-06-10 DIAGNOSIS — I10 Essential (primary) hypertension: Secondary | ICD-10-CM

## 2018-07-10 ENCOUNTER — Telehealth: Payer: Self-pay | Admitting: Family Medicine

## 2018-07-10 ENCOUNTER — Other Ambulatory Visit: Payer: Self-pay

## 2018-07-10 DIAGNOSIS — I10 Essential (primary) hypertension: Secondary | ICD-10-CM

## 2018-07-10 DIAGNOSIS — K219 Gastro-esophageal reflux disease without esophagitis: Secondary | ICD-10-CM

## 2018-07-10 NOTE — Telephone Encounter (Signed)
Patient MUST have an appointment before refills.  It was included on her last refill instructions.

## 2018-07-10 NOTE — Telephone Encounter (Signed)
Pt needs refill on metoprolol and omeprazole sent to [facility] [phone]

## 2018-07-11 ENCOUNTER — Other Ambulatory Visit: Payer: Self-pay | Admitting: Nurse Practitioner

## 2018-07-11 DIAGNOSIS — I10 Essential (primary) hypertension: Secondary | ICD-10-CM

## 2018-07-13 ENCOUNTER — Telehealth (INDEPENDENT_AMBULATORY_CARE_PROVIDER_SITE_OTHER): Payer: Medicare HMO | Admitting: Nurse Practitioner

## 2018-07-13 ENCOUNTER — Other Ambulatory Visit: Payer: Self-pay

## 2018-07-13 VITALS — BP 122/70

## 2018-07-13 DIAGNOSIS — E782 Mixed hyperlipidemia: Secondary | ICD-10-CM

## 2018-07-13 DIAGNOSIS — K219 Gastro-esophageal reflux disease without esophagitis: Secondary | ICD-10-CM | POA: Diagnosis not present

## 2018-07-13 DIAGNOSIS — I10 Essential (primary) hypertension: Secondary | ICD-10-CM

## 2018-07-13 MED ORDER — METOPROLOL SUCCINATE ER 50 MG PO TB24: 50.0000 mg | ORAL_TABLET | Freq: Every day | ORAL | 1 refills | Status: DC

## 2018-07-13 MED ORDER — ATORVASTATIN CALCIUM 20 MG PO TABS: 20.0000 mg | ORAL_TABLET | Freq: Every day | ORAL | 1 refills | Status: DC

## 2018-07-13 MED ORDER — OMEPRAZOLE 40 MG PO CPDR: 40.0000 mg | DELAYED_RELEASE_CAPSULE | Freq: Every day | ORAL | 1 refills | Status: DC

## 2018-07-13 NOTE — Telephone Encounter (Signed)
The pt was notified and like the idea of a virtual visit. She will call back later and schedule that appointment.

## 2018-07-13 NOTE — Telephone Encounter (Signed)
The pt was notified that she must be seen in the office prior to getting refill. She express concerns with coming in doing this pandemic. I informed the patient that we are now screening every patient before coming in. She was informed about our hours set aside for wellness only visit.  She was notified that we do not see the patient in our office that have symptoms of COVID-19. She express her concerns about running out of her blood pressure medication. I reminded the patient that she was notified that she needs an appt on her last few refills.  She stated that she doesn't read the bottles and will call me back after she think about.

## 2018-07-13 NOTE — Progress Notes (Signed)
CC: Hypertension and GERD  S: Reviewed CMA telephone note.  Confirmed with patient that we need to review Hypertension and GERD.   Hypertension  - She is checking BP at home or outside of clinic.  Readings 120/70s usually. - Current medications: metoprolol XL 50 mg daily, tolerating well without side effects.  She has not been out of medication. - She is not currently symptomatic. - Pt denies headache, lightheadedness, dizziness, changes in vision, chest tightness/pressure, palpitations, leg swelling, sudden loss of speech or loss of consciousness. - She  reports an exercise routine that includes walking/exercise occasionally, housework/childcare, 5 days per week. - Her diet is moderate in salt, moderate in fat, and moderate in carbohydrates. Tries to eat fruits/vegetables. Cooks low salt at home.  Meals out per week normally about 4-5 nights per week.    Hyperlipidemia - Patient never got cholesterol medication. Patient has not made any other change.  GERD   - She is not currently symptomatic.  - She is currently getting good control with omeprazole 40 mg once daily - Symptoms appear to be worsened by lying down after eating.  Notes no specific food triggers. Usually gets 2-3 hours between dinner and bedtime. - She denies melena, hematochezia, hematemesis, and coffee ground emesis.    O: Patient remotely monitored.  Verbal communication appropriate.  Cognition normal. Mood and affect are normal/appropriate.  A/P: 1. Essential hypertension Stable today on HPI and patient report.  Medications tolerated without side effects.  Continue at current doses.  Refills provided.  Labs 6 weeks.. Followup 3 months.  - metoprolol succinate (TOPROL-XL) 50 MG 24 hr tablet; Take 1 tablet (50 mg total) by mouth daily. Take with or immediately following a meal.  Dispense: 90 tablet; Refill: 1 - COMPLETE METABOLIC PANEL WITH GFR; Future  2. Mixed hyperlipidemia Likely no change as pt has not started  medication.  Medications no change. - START at current plan for atorvastatin 20 mg once daily.  Reviewed common side effects again with pt. Refills provided.  Labs in 6 weeks after starting medication.. Followup 3 months.  - atorvastatin (LIPITOR) 20 MG tablet; Take 1 tablet (20 mg total) by mouth daily.  Dispense: 90 tablet; Refill: 1 - COMPLETE METABOLIC PANEL WITH GFR; Future - Lipid panel; Future - TSH; Future  3. Gastroesophageal reflux disease without esophagitis Currently well controlled on PPI once daily. - Avoid diet triggers. Reviewed need to seek care if globus sensation, difficulty swallowing, s/sx of GI bleed. - omeprazole (PRILOSEC) 40 MG capsule; Take 1 capsule (40 mg total) by mouth daily.  Dispense: 90 capsule; Refill: 1 - CBC with Differential/Platelet; Future   Follow-up 6 weeks for labs and 3 months for OV for in office BP check due to virtual visit today 2/2 Covid-19 precautions.  Disclosed to patient at start of encounter that we will bill telephone services and she will receive bill of services provided.  Patient consents to be treated via phone prior to discussion. - Patient is at her home and is accessed via telephone. - Services are provided from [facility]. - Time spent in direct consultation with patient on phone: 18 minutes  [name], DNP, AGPCNP-BC Adult Gerontology Primary Care Nurse Practitioner [facility]

## 2018-07-13 NOTE — Telephone Encounter (Signed)
We could consider a virtual visit (telephone or MyChart). The most important part of monitoring is getting an accurate blood pressure currently and an in-person follow-up in about 6 weeks - 3 months. - Please offer this option if patient has the ability to check her BP at home.

## 2018-07-13 NOTE — Progress Notes (Signed)
cc: f/u hypertension, GERD . Pt currently not taking the Atorvastatin, because she state she never received the prescription. She is requesting refills on her Omeprazole 40MG  & Metoprolol 50MG .

## 2018-07-13 NOTE — Patient Instructions (Addendum)
Tiffany Richardson,   Thank you for coming in to clinic today.  1. Continue metoprolol XL 50 mg once daily.   - Great home BP readings!  No changes needed.  2. Continue omeprazole.  Continue staying upright 2-3 hours after meals.  Avoid alcohol to reduce this reflux further if you drink alcohol.  3. START your atorvastatin 20 mg once daily to improve cholesterol.  Continue work toward a healthy lifestyle.  This is eating healthy foods and  - You will be due for [place].  This means you should eat no food or drink after midnight.  Drink only water or coffee without cream/sugar on the morning of your lab visit. - Please go ahead and schedule a "Lab Only" visit in the morning at the clinic for lab draw in about 6 weeks after you start your atorvastatin. - Your results will be available about 2-3 days after blood draw.  If you have set up a MyChart account, you can can log in to MyChart online to view your results and a brief explanation. Also, we can discuss your results together at your next office visit if you would like.   Please schedule a follow-up appointment with [name], AGNP.  Follow-up for labs 6 weeks and in 3 months.  If you have any other questions or concerns, please feel free to call the clinic or send a message through [facility]. You may also schedule an earlier appointment if necessary.  You will receive a survey after today's visit either digitally by e-mail or paper by [employer]. Your experiences and feedback matter to [place].  Please respond so we know how we are doing as we provide care for you.   [name], DNP, AGNP-BC Adult Gerontology Nurse Practitioner [facility]

## 2018-07-14 ENCOUNTER — Other Ambulatory Visit: Payer: Self-pay | Admitting: Nurse Practitioner

## 2018-07-14 DIAGNOSIS — I10 Essential (primary) hypertension: Secondary | ICD-10-CM

## 2018-07-14 DIAGNOSIS — K219 Gastro-esophageal reflux disease without esophagitis: Secondary | ICD-10-CM

## 2018-07-14 MED ORDER — OMEPRAZOLE 40 MG PO CPDR: 40.0000 mg | DELAYED_RELEASE_CAPSULE | Freq: Every day | ORAL | 1 refills | Status: DC

## 2018-07-14 MED ORDER — METOPROLOL SUCCINATE ER 50 MG PO TB24: 50.0000 mg | ORAL_TABLET | Freq: Every day | ORAL | 1 refills | Status: DC

## 2018-07-14 NOTE — Addendum Note (Signed)
Addended by: [name] R on: 07/14/2018 05:00 PM   Modules accepted: Orders

## 2018-09-07 ENCOUNTER — Other Ambulatory Visit: Payer: Medicare HMO

## 2018-10-19 ENCOUNTER — Ambulatory Visit: Payer: Medicare HMO | Admitting: Nurse Practitioner

## 2018-11-29 ENCOUNTER — Other Ambulatory Visit: Payer: Self-pay | Admitting: Nurse Practitioner

## 2018-11-29 DIAGNOSIS — J3089 Other allergic rhinitis: Secondary | ICD-10-CM

## 2019-01-08 ENCOUNTER — Other Ambulatory Visit: Payer: Self-pay | Admitting: Nurse Practitioner

## 2019-01-08 DIAGNOSIS — E782 Mixed hyperlipidemia: Secondary | ICD-10-CM

## 2019-01-19 ENCOUNTER — Other Ambulatory Visit: Payer: Self-pay | Admitting: Nurse Practitioner

## 2019-01-19 DIAGNOSIS — J3089 Other allergic rhinitis: Secondary | ICD-10-CM

## 2019-03-22 ENCOUNTER — Other Ambulatory Visit: Payer: Self-pay | Admitting: Nurse Practitioner

## 2019-03-22 DIAGNOSIS — J3089 Other allergic rhinitis: Secondary | ICD-10-CM

## 2019-04-15 ENCOUNTER — Other Ambulatory Visit: Payer: Self-pay | Admitting: Family Medicine

## 2019-04-15 DIAGNOSIS — J3089 Other allergic rhinitis: Secondary | ICD-10-CM

## 2019-04-26 DIAGNOSIS — D044 Carcinoma in situ of skin of scalp and neck: Secondary | ICD-10-CM | POA: Diagnosis not present

## 2019-04-26 DIAGNOSIS — L821 Other seborrheic keratosis: Secondary | ICD-10-CM | POA: Diagnosis not present

## 2019-04-26 DIAGNOSIS — D485 Neoplasm of uncertain behavior of skin: Secondary | ICD-10-CM | POA: Diagnosis not present

## 2019-04-26 DIAGNOSIS — L570 Actinic keratosis: Secondary | ICD-10-CM | POA: Diagnosis not present

## 2019-05-10 DIAGNOSIS — C4441 Basal cell carcinoma of skin of scalp and neck: Secondary | ICD-10-CM | POA: Diagnosis not present

## 2019-05-10 DIAGNOSIS — C4442 Squamous cell carcinoma of skin of scalp and neck: Secondary | ICD-10-CM | POA: Diagnosis not present

## 2019-05-11 ENCOUNTER — Other Ambulatory Visit: Payer: Self-pay | Admitting: Family Medicine

## 2019-05-11 DIAGNOSIS — J3089 Other allergic rhinitis: Secondary | ICD-10-CM

## 2019-06-04 ENCOUNTER — Other Ambulatory Visit: Payer: Self-pay | Admitting: Family Medicine

## 2019-06-04 DIAGNOSIS — J3089 Other allergic rhinitis: Secondary | ICD-10-CM

## 2019-06-29 ENCOUNTER — Other Ambulatory Visit: Payer: Self-pay | Admitting: Nurse Practitioner

## 2019-06-29 ENCOUNTER — Other Ambulatory Visit: Payer: Self-pay | Admitting: Family Medicine

## 2019-06-29 DIAGNOSIS — E782 Mixed hyperlipidemia: Secondary | ICD-10-CM

## 2019-06-29 DIAGNOSIS — J3089 Other allergic rhinitis: Secondary | ICD-10-CM

## 2019-06-30 DIAGNOSIS — L570 Actinic keratosis: Secondary | ICD-10-CM | POA: Diagnosis not present

## 2019-07-01 ENCOUNTER — Other Ambulatory Visit: Payer: Self-pay | Admitting: Nurse Practitioner

## 2019-07-01 DIAGNOSIS — K219 Gastro-esophageal reflux disease without esophagitis: Secondary | ICD-10-CM

## 2019-07-01 DIAGNOSIS — I10 Essential (primary) hypertension: Secondary | ICD-10-CM

## 2019-07-05 ENCOUNTER — Ambulatory Visit (INDEPENDENT_AMBULATORY_CARE_PROVIDER_SITE_OTHER): Payer: Medicare HMO | Admitting: Family Medicine

## 2019-07-05 ENCOUNTER — Encounter: Payer: Self-pay | Admitting: Family Medicine

## 2019-07-05 ENCOUNTER — Other Ambulatory Visit: Payer: Self-pay

## 2019-07-05 VITALS — BP 143/67 | HR 66 | Temp 97.1°F | Ht 64.0 in | Wt 144.4 lb

## 2019-07-05 DIAGNOSIS — I10 Essential (primary) hypertension: Secondary | ICD-10-CM | POA: Diagnosis not present

## 2019-07-05 DIAGNOSIS — Z1231 Encounter for screening mammogram for malignant neoplasm of breast: Secondary | ICD-10-CM

## 2019-07-05 DIAGNOSIS — J3089 Other allergic rhinitis: Secondary | ICD-10-CM | POA: Diagnosis not present

## 2019-07-05 DIAGNOSIS — K580 Irritable bowel syndrome with diarrhea: Secondary | ICD-10-CM | POA: Diagnosis not present

## 2019-07-05 DIAGNOSIS — E782 Mixed hyperlipidemia: Secondary | ICD-10-CM | POA: Diagnosis not present

## 2019-07-05 DIAGNOSIS — K219 Gastro-esophageal reflux disease without esophagitis: Secondary | ICD-10-CM | POA: Diagnosis not present

## 2019-07-05 LAB — POCT URINALYSIS DIPSTICK
Bilirubin, UA: NEGATIVE
Blood, UA: NEGATIVE
Glucose, UA: NEGATIVE
Ketones, UA: NEGATIVE
Leukocytes, UA: NEGATIVE
Nitrite, UA: NEGATIVE
Protein, UA: NEGATIVE
Spec Grav, UA: 1.02 (ref 1.010–1.025)
Urobilinogen, UA: 0.2 E.U./dL
pH, UA: 5 (ref 5.0–8.0)

## 2019-07-05 MED ORDER — FLUTICASONE PROPIONATE 50 MCG/ACT NA SUSP: NASAL | 3 refills | Status: DC

## 2019-07-05 MED ORDER — METOPROLOL SUCCINATE ER 50 MG PO TB24: 50.0000 mg | ORAL_TABLET | Freq: Every day | ORAL | 1 refills | Status: DC

## 2019-07-05 MED ORDER — OMEPRAZOLE 40 MG PO CPDR: DELAYED_RELEASE_CAPSULE | ORAL | 1 refills | Status: DC

## 2019-07-05 MED ORDER — DICYCLOMINE HCL 10 MG PO CAPS: 10.0000 mg | ORAL_CAPSULE | Freq: Three times a day (TID) | ORAL | 3 refills | Status: DC

## 2019-07-05 MED ORDER — ATORVASTATIN CALCIUM 20 MG PO TABS: 20.0000 mg | ORAL_TABLET | Freq: Every day | ORAL | 1 refills | Status: DC

## 2019-07-05 MED ORDER — ATORVASTATIN CALCIUM 20 MG PO TABS: 20.0000 mg | ORAL_TABLET | Freq: Every day | ORAL | 0 refills | Status: DC

## 2019-07-05 NOTE — Assessment & Plan Note (Signed)
Unknown control, last labs drawn 09/18/2017.  Labs ordered today.  Currently taking atorvastatin 20mg  daily and  tolerating it well.   Plan: 1) Labs ordered and to be drawn today 2) Continue atorvastatin 20mg  daily  3) Heart healthy diet and to exercise every other day for 30 minutes per day, going no more than 2 days in a row without exercise. 4) We will see you back in 6 months

## 2019-07-05 NOTE — Patient Instructions (Addendum)
Have your labs drawn and we will contact you once we receive the results.  For Mammogram screening for breast cancer   Call the [facility] below anytime to schedule your own appointment now that order has been placed.  [facility] [address], [place] [address] Phone: [phone]  [facility] [address] [place], [place] [address] Phone: [phone]  Try to get exercise a minimum of 30 minutes per day at least 5 days per week as well as  adequate water intake all while measuring blood pressure a few times per week.  Keep a blood pressure log and bring back to clinic at your next visit.  If your readings are consistently over 140/90 to contact our office/send me a MyChart message and we will see you sooner.  Can try DASH and Mediterranean diet options, avoiding processed foods, lowering sodium intake, avoiding pork products, and eating a plant based diet for optimal health.  Having high HDL (good) cholesterol and low triglyceride levels can reduce your risk of heart attack and stroke.  The following steps can be taken to reduce triglyceride levels and increase HDL (good) cholesterol levels.  Avoid or limit alcohol Alcohol significantly raises triglyceride levels.  If your triglycerides are higher than 150, avoid or limit alcohol.  Limit fruit juice and dried fruits Even fresh fruit juice contains a large amount of fructose sugar and can increase triglyceride and blood sugar levels.  Fruit juice and dried fruit are also high in calories and can add unwanted pounds.  Replace fruit juice and dried fruits with fresh fruit that has more fiber and fewer calories.  Limit non-diet soft drinks and sports drinks Non-diet soft drinks and sport drinks contain as many as 12 teaspoons of sugar per serving.  This sugar can increase triglyceride and blood sugar levels.  Non-diet soft drinks and sport drinks also  contain calories that add unwanted pounds.  Replace non-diet soft drinks and sports drinks with water, unsweetened tea, diet colas or beverages sweetened with Nutra-Sweet.  Limit concentrated sweets Even fat free or low fat sweets can raise your triglyceride and/or blood sugar levels.  Concentrated sweets include sugar, honey, jelly, candy, cookies, cakes, pies, pastries, frozen desserts, puddings, and sugar sweetened cereals.  Replace concentrated sweets with high fiber foods such as fruit, low fat yogurt, sugar free gelatin and low fat puddings.  Limit refined carbohydrates Refined carbohydrates include white flour, white bread, white rice, and some pasta.  Limit portion sizes of these foods or replace them with whole wheat bread, lentils, whole grains, brown rice, and spinach or whole-wheat pastas.  Reduce your weight, if needed Even a 5-10 pound weight loss can cause your triglyceride level to decrease significantly.  You can lose 1-2 pounds per week by limiting your portion sizes and exercising 5-6 times per week.  Include monounsaturated fats in your diet.  Include Monounsaturated fats in your diet Moderate amounts of monounsaturated fat can raise your HDL (good) cholesterol.  Sources of monounsaturated fat include olive oil, canola oil, peanut oil, peanuts, peanut butter, cashews, olives, and avocados.  Choose peanut butter that does not have sugar in the ingredient list.  Since these foods are high in calories, serving sizes should be moderate.  Include fish in your diet Fish is low in saturated fat and rich in Omega-3 fatty acids.  Good choices include mackerel, salmon, herring, bluefish, lake trout, tuna, and sardines canned in oil.  If you  smoke, quit Smoking lowers HDL (good) cholesterol and raises triglycerides.  When you quit smoking your HDL (good) cholesterol increases and triglycerides decrease.  Stay active Exercise raises your HDL (good) cholesterol.  Walk, ride a bike, or  swim for at least 20 minutes, 3-5 times per week.  Ideally, you should try to exercise for 30-60 minutes most days of the week.  Check with your Healthcare Provider before beginning an exercise program.  We will plan to see you back in 6 months for hypertension & hyperlipidemia follow up  You will receive a survey after today's visit either digitally by e-mail or paper by [facility] mail. Your experiences and feedback matter to [place].  Please respond so we know how we are doing as we provide care for you.  Call us with any questions/concerns/needs.  It is my goal to be available to you for your health concerns.  Thanks for choosing me to be a partner in your healthcare needs!  Harlin Rain, FNP-C Family Nurse Practitioner [facility] Phone: [phone]

## 2019-07-05 NOTE — Assessment & Plan Note (Signed)
Reports stable and well controlled hypertension at home with SBP in the 120's daily.  Reports increased stress lately.  Uncontrolled in clinic today.  BP goal < 130/80.  Pt working on lifestyle modifications.  Taking medications tolerating well without side effects. No complications.  Plan: 1. Continue taking Metoprolol Succinate (Toprol XL) 50mg  daily 2. Obtain labs ordered today  3. Encouraged heart healthy diet and increasing exercise to 30 minutes most days of the week, going no more than 2 days in a row without exercise. 4. Check BP 1-2 x per week at home, keep log, and bring to clinic at next appointment.  If blood pressure readings are consistently over 130/80 to contact our office for a sooner appointment. 5. Follow up 6 months.

## 2019-07-05 NOTE — Progress Notes (Signed)
Subjective:    Patient ID: Tiffany Richardson, female    DOB: 1946-02-17, 74 y.o.   MRN: [id number]  Tiffany Richardson is a 74 y.o. female presenting on 07/05/2019 for Hypertension   HPI  Hypertension - She is checking BP at home or outside of clinic.  Readings 120's/70's at home - Current medications: Metoprolol Succinate (Toprol XL) 50mg  daily, tolerating well without side effects - She is not currently symptomatic. - Pt denies headache, lightheadedness, dizziness, changes in vision, chest tightness/pressure, palpitations, leg swelling, sudden loss of speech or loss of consciousness. - She  reports no regular exercise routine. - Her diet is moderate in salt, moderate in fat, and moderate in carbohydrates.  [facility] Mammo: DUE - Offered and accepted DEXA: DUE - Offered and declined HIV/Hep C Screening - NOT DONE - Offered and declined Influenza vaccine: Offered and declined PCV13 Vaccine - Offered and declined  Depression screen [facility] 2/9 07/05/2019 10/10/2016 04/28/2015  Decreased Interest 0 0 0  Down, Depressed, Hopeless 0 0 0  PHQ - 2 Score 0 0 0    Social History   Tobacco Use  . Smoking status: Never Smoker  . Smokeless tobacco: Never Used  Substance Use Topics  . Alcohol use: No    Alcohol/week: 0.0 standard drinks  . Drug use: No    Review of Systems  Constitutional: Negative.   HENT: Negative.   Eyes: Negative.   Respiratory: Negative.   Cardiovascular: Negative.   Gastrointestinal: Negative.   Endocrine: Negative.   Genitourinary: Negative.   Musculoskeletal: Negative.   Skin: Negative.   Allergic/Immunologic: Negative.   Neurological: Negative.   Hematological: Negative.   Psychiatric/Behavioral: Negative.    Per HPI unless specifically indicated above     Objective:    BP (!) 143/67 (BP Location: Right Arm, Patient Position: Sitting, Cuff Size: Normal)   Pulse 66   Temp (!) 97.1 F (36.2 C) (Temporal)   Ht 5\' 4"  (1.626 m)   Wt 144 lb 6.4 oz  (65.5 kg)   BMI 24.79 kg/m   Wt Readings from Last 3 Encounters:  07/05/19 144 lb 6.4 oz (65.5 kg)  09/18/17 150 lb 6.4 oz (68.2 kg)  10/10/16 145 lb (65.8 kg)    Physical Exam Vitals reviewed.  Constitutional:      General: She is not in acute distress.    Appearance: Normal appearance. She is obese. She is not ill-appearing or toxic-appearing.  HENT:     Head: Normocephalic.  Eyes:     General: Lids are normal. Vision grossly intact.        Right eye: No discharge.        Left eye: No discharge.     Extraocular Movements: Extraocular movements intact.     Conjunctiva/sclera: Conjunctivae normal.     Pupils: Pupils are equal, round, and reactive to light.  Cardiovascular:     Rate and Rhythm: Normal rate and regular rhythm.     Pulses: Normal pulses.          Dorsalis pedis pulses are 2+ on the right side and 2+ on the left side.       Posterior tibial pulses are 2+ on the right side and 2+ on the left side.     Heart sounds: Normal heart sounds. No murmur. No friction rub. No gallop.   Pulmonary:     Effort: Pulmonary effort is normal. No respiratory distress.     Breath sounds: Normal breath sounds.  Musculoskeletal:  Right lower leg: No edema.     Left lower leg: No edema.  Feet:     Right foot:     Skin integrity: Skin integrity normal.     Left foot:     Skin integrity: Skin integrity normal.  Skin:    General: Skin is warm and dry.     Capillary Refill: Capillary refill takes less than 2 seconds.  Neurological:     General: No focal deficit present.     Mental Status: She is alert and oriented to person, place, and time.     Cranial Nerves: No cranial nerve deficit.     Sensory: No sensory deficit.     Motor: No weakness.     Coordination: Coordination normal.     Gait: Gait normal.  Psychiatric:        Attention and Perception: Attention and perception normal.        Mood and Affect: Mood and affect normal.        Speech: Speech normal.         Behavior: Behavior normal. Behavior is cooperative.        Thought Content: Thought content normal.        Cognition and Memory: Cognition and memory normal.        Judgment: Judgment normal.     Results for orders placed or performed in visit on 07/05/19  POCT Urinalysis Dipstick  Result Value Ref Range   Color, UA Yellow    Clarity, UA clear    Glucose, UA Negative Negative   Bilirubin, UA negative    Ketones, UA negative    Spec Grav, UA 1.020 1.010 - 1.025   Blood, UA negative    pH, UA 5.0 5.0 - 8.0   Protein, UA Negative Negative   Urobilinogen, UA 0.2 0.2 or 1.0 E.U./dL   Nitrite, UA negative    Leukocytes, UA Negative Negative   Appearance     Odor        Assessment & Plan:   Problem List Items Addressed This Visit      Cardiovascular and Mediastinum   Hypertension - Primary    Reports stable and well controlled hypertension at home with SBP in the 120's daily.  Reports increased stress lately.  Uncontrolled in clinic today.  BP goal < 130/80.  Pt working on lifestyle modifications.  Taking medications tolerating well without side effects. No complications.  Plan: 1. Continue taking Metoprolol Succinate (Toprol XL) 50mg  daily 2. Obtain labs ordered today  3. Encouraged heart healthy diet and increasing exercise to 30 minutes most days of the week, going no more than 2 days in a row without exercise. 4. Check BP 1-2 x per week at home, keep log, and bring to clinic at next appointment.  If blood pressure readings are consistently over 130/80 to contact our office for a sooner appointment. 5. Follow up 6 months.        Relevant Medications   metoprolol succinate (TOPROL-XL) 50 MG 24 hr tablet   atorvastatin (LIPITOR) 20 MG tablet   Other Relevant Orders   POCT Urinalysis Dipstick (Completed)   CBC with Differential   COMPLETE METABOLIC PANEL WITH GFR     Digestive   Gastroesophageal reflux disease without esophagitis   Relevant Medications   dicyclomine  (BENTYL) 10 MG capsule   omeprazole (PRILOSEC) 40 MG capsule   Other Relevant Orders   CBC with Differential   COMPLETE METABOLIC PANEL WITH GFR   IBS (  irritable bowel syndrome)   Relevant Medications   dicyclomine (BENTYL) 10 MG capsule   omeprazole (PRILOSEC) 40 MG capsule     Other   Mixed hyperlipidemia    Unknown control, last labs drawn 09/18/2017.  Labs ordered today.  Currently taking atorvastatin 20mg  daily and  tolerating it well.   Plan: 1) Labs ordered and to be drawn today 2) Continue atorvastatin 20mg  daily  3) Heart healthy diet and to exercise every other day for 30 minutes per day, going no more than 2 days in a row without exercise. 4) We will see you back in 6 months      Relevant Medications   metoprolol succinate (TOPROL-XL) 50 MG 24 hr tablet   atorvastatin (LIPITOR) 20 MG tablet   Other Relevant Orders   CBC with Differential   COMPLETE METABOLIC PANEL WITH GFR   Lipid Profile    Other Visit Diagnoses    Seasonal allergic rhinitis due to other allergic trigger       Relevant Medications   fluticasone (FLONASE) 50 MCG/ACT nasal spray   Encounter for screening mammogram for malignant neoplasm of breast       Relevant Orders   MM Digital Screening      Meds ordered this encounter  Medications  . DISCONTD: atorvastatin (LIPITOR) 20 MG tablet    Sig: Take 1 tablet (20 mg total) by mouth daily.    Dispense:  30 tablet    Refill:  0    Needs a follow up appointment for any additional refills.  Please have patient call our office.  . dicyclomine (BENTYL) 10 MG capsule    Sig: Take 1 capsule (10 mg total) by mouth 4 (four) times daily -  before meals and at bedtime.    Dispense:  360 capsule    Refill:  3  . fluticasone (FLONASE) 50 MCG/ACT nasal spray    Sig: 2 sprays into each nostril daily    Dispense:  16 mL    Refill:  3  . metoprolol succinate (TOPROL-XL) 50 MG 24 hr tablet    Sig: Take 1 tablet (50 mg total) by mouth daily. Take with or  immediately following a meal.    Dispense:  90 tablet    Refill:  1  . omeprazole (PRILOSEC) 40 MG capsule    Sig: TAKE 1 CAPSULE BY MOUTH EVERY DAY    Dispense:  90 capsule    Refill:  1  . atorvastatin (LIPITOR) 20 MG tablet    Sig: Take 1 tablet (20 mg total) by mouth daily.    Dispense:  90 tablet    Refill:  1    Needs a follow up appointment for any additional refills.  Please have patient call our office.      Follow up plan: Return in about 6 months (around 01/05/2020) for HTN F/U.   Harlin Rain, [place] [facility] Nurse Practitioner [facility] 07/05/2019, 8:14 AM

## 2019-07-06 ENCOUNTER — Other Ambulatory Visit: Payer: Self-pay | Admitting: Family Medicine

## 2019-07-06 DIAGNOSIS — R899 Unspecified abnormal finding in specimens from other organs, systems and tissues: Secondary | ICD-10-CM

## 2019-07-06 LAB — LIPID PANEL
Cholesterol: 134 mg/dL (ref <200)
HDL: 35 mg/dL — ABNORMAL LOW (ref >=50)
LDL Cholesterol (Calc): 77 mg/dL (calc)
Non-HDL Cholesterol (Calc): 99 mg/dL (calc) (ref <130)
Total CHOL/HDL Ratio: 3.8 (calc) (ref <5.0)
Triglycerides: 138 mg/dL (ref <150)

## 2019-07-06 LAB — CBC WITH DIFFERENTIAL/PLATELET
Absolute Monocytes: 655 cells/uL (ref 200–950)
Basophils Absolute: 137 cells/uL (ref 0–200)
Basophils Relative: 1.5 %
Eosinophils Absolute: 291 cells/uL (ref 15–500)
Eosinophils Relative: 3.2 %
HCT: 44.2 % (ref 35.0–45.0)
Hemoglobin: 14.6 g/dL (ref 11.7–15.5)
Lymphs Abs: 2685 cells/uL (ref 850–3900)
MCH: 29.5 pg (ref 27.0–33.0)
MCHC: 33 g/dL (ref 32.0–36.0)
MCV: 89.3 fL (ref 80.0–100.0)
MPV: 10.1 fL (ref 7.5–12.5)
Monocytes Relative: 7.2 %
Neutro Abs: 5333 cells/uL (ref 1500–7800)
Neutrophils Relative %: 58.6 %
Platelets: 796 10*3/uL — ABNORMAL HIGH (ref 140–400)
RBC: 4.95 10*6/uL (ref 3.80–5.10)
RDW: 13.5 % (ref 11.0–15.0)
Total Lymphocyte: 29.5 %
WBC: 9.1 10*3/uL (ref 3.8–10.8)

## 2019-07-06 LAB — COMPLETE METABOLIC PANEL WITH GFR
AG Ratio: 1.7 (calc) (ref 1.0–2.5)
ALT: 14 U/L (ref 6–29)
AST: 17 U/L (ref 10–35)
Albumin: 3.9 g/dL (ref 3.6–5.1)
Alkaline phosphatase (APISO): 134 U/L (ref 37–153)
BUN/Creatinine Ratio: 12 (calc) (ref 6–22)
BUN: 12 mg/dL (ref 7–25)
CO2: 31 mmol/L (ref 20–32)
Calcium: 9.6 mg/dL (ref 8.6–10.4)
Chloride: 105 mmol/L (ref 98–110)
Creat: 0.98 mg/dL — ABNORMAL HIGH (ref 0.60–0.93)
GFR, Est African American: 67 mL/min/{1.73_m2} (ref >=60)
GFR, Est Non African American: 58 mL/min/{1.73_m2} — ABNORMAL LOW (ref >=60)
Globulin: 2.3 g/dL (calc) (ref 1.9–3.7)
Glucose, Bld: 96 mg/dL (ref 65–99)
Potassium: 5.7 mmol/L — ABNORMAL HIGH (ref 3.5–5.3)
Sodium: 141 mmol/L (ref 135–146)
Total Bilirubin: 0.8 mg/dL (ref 0.2–1.2)
Total Protein: 6.2 g/dL (ref 6.1–8.1)

## 2019-07-06 NOTE — Progress Notes (Signed)
Platelets and Potassium are elevated.  It can be falsely elevated if the blood had clotted or sat out too long after it was drawn before the lab could process it.  Can we have this redrawn sometime today through Friday to see if this is falsely elevated or if it is the actual result?  Thanks

## 2019-07-06 NOTE — Progress Notes (Signed)
c 

## 2019-07-07 ENCOUNTER — Other Ambulatory Visit: Payer: Medicare HMO

## 2019-07-07 ENCOUNTER — Other Ambulatory Visit: Payer: Self-pay

## 2019-07-07 DIAGNOSIS — R899 Unspecified abnormal finding in specimens from other organs, systems and tissues: Secondary | ICD-10-CM | POA: Diagnosis not present

## 2019-07-08 ENCOUNTER — Other Ambulatory Visit: Payer: Self-pay | Admitting: Family Medicine

## 2019-07-08 DIAGNOSIS — R7989 Other specified abnormal findings of blood chemistry: Secondary | ICD-10-CM

## 2019-07-08 LAB — CBC WITH DIFFERENTIAL/PLATELET
Absolute Monocytes: 614 cells/uL (ref 200–950)
Basophils Absolute: 134 cells/uL (ref 0–200)
Basophils Relative: 1.5 %
Eosinophils Absolute: 303 cells/uL (ref 15–500)
Eosinophils Relative: 3.4 %
HCT: 44.8 % (ref 35.0–45.0)
Hemoglobin: 14.6 g/dL (ref 11.7–15.5)
Lymphs Abs: 2875 cells/uL (ref 850–3900)
MCH: 29 pg (ref 27.0–33.0)
MCHC: 32.6 g/dL (ref 32.0–36.0)
MCV: 88.9 fL (ref 80.0–100.0)
MPV: 10 fL (ref 7.5–12.5)
Monocytes Relative: 6.9 %
Neutro Abs: 4975 cells/uL (ref 1500–7800)
Neutrophils Relative %: 55.9 %
Platelets: 819 10*3/uL — ABNORMAL HIGH (ref 140–400)
RBC: 5.04 10*6/uL (ref 3.80–5.10)
RDW: 13.4 % (ref 11.0–15.0)
Total Lymphocyte: 32.3 %
WBC: 8.9 10*3/uL (ref 3.8–10.8)

## 2019-07-08 LAB — COMPLETE METABOLIC PANEL WITH GFR
AG Ratio: 1.6 (calc) (ref 1.0–2.5)
ALT: 12 U/L (ref 6–29)
AST: 17 U/L (ref 10–35)
Albumin: 3.9 g/dL (ref 3.6–5.1)
Alkaline phosphatase (APISO): 137 U/L (ref 37–153)
BUN/Creatinine Ratio: 12 (calc) (ref 6–22)
BUN: 12 mg/dL (ref 7–25)
CO2: 31 mmol/L (ref 20–32)
Calcium: 9.6 mg/dL (ref 8.6–10.4)
Chloride: 105 mmol/L (ref 98–110)
Creat: 0.98 mg/dL — ABNORMAL HIGH (ref 0.60–0.93)
GFR, Est African American: 67 mL/min/{1.73_m2} (ref >=60)
GFR, Est Non African American: 58 mL/min/{1.73_m2} — ABNORMAL LOW (ref >=60)
Globulin: 2.5 g/dL (calc) (ref 1.9–3.7)
Glucose, Bld: 92 mg/dL (ref 65–99)
Potassium: 5.4 mmol/L — ABNORMAL HIGH (ref 3.5–5.3)
Sodium: 141 mmol/L (ref 135–146)
Total Bilirubin: 0.8 mg/dL (ref 0.2–1.2)
Total Protein: 6.4 g/dL (ref 6.1–8.1)

## 2019-07-08 NOTE — Progress Notes (Signed)
Discussed continued elevated platelets with patient.  No hematology evaluation for elevated platelets in the past.  Will refer to hematology for further work up and if any treatment is needed.  Patient verbalized understanding and denied any additional questions/concerns/needs.

## 2019-07-08 NOTE — Progress Notes (Signed)
Discussed with patient, elevated platelets for many years.  States she has never had been worked up for her elevated platelets or met with hematology.  Patient verbalized understanding and agreement with referral to hematology.

## 2019-07-20 ENCOUNTER — Inpatient Hospital Stay: Payer: 59 | Admitting: Oncology

## 2019-10-17 ENCOUNTER — Other Ambulatory Visit: Payer: Self-pay | Admitting: Family Medicine

## 2019-10-17 DIAGNOSIS — J3089 Other allergic rhinitis: Secondary | ICD-10-CM

## 2019-10-17 NOTE — Telephone Encounter (Signed)
Requested Prescriptions  Pending Prescriptions Disp Refills   fluticasone (FLONASE) 50 MCG/ACT nasal spray [Pharmacy Med Name: FLUTICASONE PROP 50 MCG SPRAY] 48 mL 1    Sig: SPRAY 2 SPRAYS INTO EACH NOSTRIL EVERY DAY     Ear, Nose, and Throat: Nasal Preparations - Corticosteroids Passed - 10/17/2019  9:25 AM      Passed - Valid encounter within last 12 months    Recent Outpatient Visits          3 months ago Essential hypertension   [facility], FNP   1 year ago Essential hypertension   [facility] [employer], NP   2 years ago Generalized abdominal pain   [facility] Merrilyn Puma, Jerrel Ivory, NP   3 years ago Essential hypertension   [facility] [employer], NP   3 years ago Acute recurrent frontal sinusitis   [facility] Vincenza Hews, Genevie Cheshire, NP

## 2020-03-22 ENCOUNTER — Other Ambulatory Visit: Payer: Self-pay | Admitting: Family Medicine

## 2020-03-22 DIAGNOSIS — I10 Essential (primary) hypertension: Secondary | ICD-10-CM

## 2020-03-22 DIAGNOSIS — K219 Gastro-esophageal reflux disease without esophagitis: Secondary | ICD-10-CM

## 2020-03-22 NOTE — Telephone Encounter (Signed)
Call to patient- needs 6 month medication follow up.Patient states she is not crazy about coming into the office at this time. Asked patient if she checks her BP at home and she states she is well looked after- granddaughter is EMT and also has firefighter in the home. Advised patient would see if this is an appointment the provider would do virtually- will call her back. Patient wants virtual visit for medication check.

## 2020-03-22 NOTE — Telephone Encounter (Signed)
Can schedule virtual OV?

## 2020-03-22 NOTE — Telephone Encounter (Signed)
Pt appt scheduled for Monday at 9:40am.

## 2020-03-27 ENCOUNTER — Other Ambulatory Visit: Payer: Self-pay

## 2020-03-27 ENCOUNTER — Encounter: Payer: Self-pay | Admitting: Family Medicine

## 2020-03-27 ENCOUNTER — Telehealth (INDEPENDENT_AMBULATORY_CARE_PROVIDER_SITE_OTHER): Payer: Medicare HMO | Admitting: Family Medicine

## 2020-03-27 VITALS — BP 122/70

## 2020-03-27 DIAGNOSIS — J3089 Other allergic rhinitis: Secondary | ICD-10-CM | POA: Diagnosis not present

## 2020-03-27 DIAGNOSIS — I10 Essential (primary) hypertension: Secondary | ICD-10-CM

## 2020-03-27 DIAGNOSIS — J302 Other seasonal allergic rhinitis: Secondary | ICD-10-CM | POA: Insufficient documentation

## 2020-03-27 MED ORDER — FLUTICASONE PROPIONATE 50 MCG/ACT NA SUSP: NASAL | 1 refills | Status: DC

## 2020-03-27 NOTE — Progress Notes (Signed)
Virtual Visit via Telephone  The purpose of this virtual visit is to provide medical care while limiting exposure to the novel coronavirus (COVID19) for both patient and office staff.  Consent was obtained for phone visit:  Yes.   Answered questions that patient had about telehealth interaction:  Yes.   I discussed the limitations, risks, security and privacy concerns of performing an evaluation and management service by telephone. I also discussed with the patient that there may be a patient responsible charge related to this service. The patient expressed understanding and agreed to proceed.  Patient is at home and is accessed via telephone Services are provided by Harlin Rain, FNP-C from [facility])  ---------------------------------------------------------------------- Chief Complaint  Patient presents with  . Hypertension    S: Reviewed CMA documentation. I have called patient and gathered additional HPI as follows:  Tiffany Richardson presents for telemedicine visit via phone for medication refill on her metoprolol and her flonase.  Reports that she has been taking her medications and tolerating well.  Has no acute concerns today.  Patient is currently home Denies any high risk travel to areas of current concern for COVID19. Denies any known or suspected exposure to person with or possibly with COVID19.  Past Medical History:  Diagnosis Date  . Allergy   . Cancer ([place])    basal cell  . Emphysema of lung ([place])   . Hypertension    Social History   Tobacco Use  . Smoking status: Never Smoker  . Smokeless tobacco: Never Used  Substance Use Topics  . Alcohol use: No    Alcohol/week: 0.0 standard drinks  . Drug use: No    Current Outpatient Medications:  .  aspirin EC 81 MG tablet, Take 81 mg by mouth., Disp: , Rfl:  .  atorvastatin (LIPITOR) 20 MG tablet, Take 1 tablet (20 mg total) by mouth daily., Disp: 90 tablet, Rfl: 1 .  metoprolol succinate  (TOPROL-XL) 50 MG 24 hr tablet, TAKE 1 TABLET (50 MG TOTAL) BY MOUTH DAILY. TAKE WITH OR IMMEDIATELY FOLLOWING A MEAL., Disp: 90 tablet, Rfl: 1 .  omeprazole (PRILOSEC) 40 MG capsule, TAKE 1 CAPSULE BY MOUTH EVERY DAY, Disp: 90 capsule, Rfl: 0 .  fluticasone (FLONASE) 50 MCG/ACT nasal spray, SPRAY 2 SPRAYS INTO EACH NOSTRIL EVERY DAY, Disp: 48 mL, Rfl: 1  Depression screen [facility] 2/9 07/05/2019 10/10/2016 04/28/2015  Decreased Interest 0 0 0  Down, Depressed, Hopeless 0 0 0  PHQ - 2 Score 0 0 0    No flowsheet data found.  -------------------------------------------------------------------------- O: No physical exam performed due to remote telephone encounter.  Physical Exam: Patient remotely monitored without video.  Verbal communication appropriate.  Cognition normal.  No results found for this or any previous visit (from the past 2160 hour(s)).  -------------------------------------------------------------------------- A&P:  Problem List Items Addressed This Visit      Cardiovascular and Mediastinum   Hypertension - Primary    Controlled hypertension.  BP is at goal < 130/80.  Pt reports working on lifestyle modifications.  Taking medications tolerating well without side effects.  Complications:  No complications  Plan: 1. Continue taking metoprolol succinate 50mg  daily 2. Obtain labs at next visit in 3 months  3. Encouraged heart healthy diet and increasing exercise to 30 minutes most days of the week, going no more than 2 days in a row without exercise. 4. Check BP 1-2 x per week at home, keep log, and bring to clinic at next appointment.  5. Follow up 3 months.         Other   Seasonal allergies    Reports stable and well controlled with flonase as needed.  Will send in refill to pharmacy on file.       Other Visit Diagnoses    Seasonal allergic rhinitis due to other allergic trigger       Relevant Medications   fluticasone (FLONASE) 50 MCG/ACT nasal spray      Meds  ordered this encounter  Medications  . fluticasone (FLONASE) 50 MCG/ACT nasal spray    Sig: SPRAY 2 SPRAYS INTO EACH NOSTRIL EVERY DAY    Dispense:  48 mL    Refill:  1    Follow-up: - Return in 3 months for hypertension follow up and lab work  Patient verbalizes understanding with the above medical recommendations including the limitation of remote medical advice.  Specific follow-up and call-back criteria were given for patient to follow-up or seek medical care more urgently if needed.  - Time spent in direct consultation with patient on phone: 6 minutes  Harlin Rain, [facility] 03/27/2020, 10:00 AM

## 2020-03-27 NOTE — Assessment & Plan Note (Signed)
Controlled hypertension.  BP is at goal < 130/80.  Pt reports working on lifestyle modifications.  Taking medications tolerating well without side effects.  Complications:  No complications  Plan: 1. Continue taking metoprolol succinate 50mg  daily 2. Obtain labs at next visit in 3 months  3. Encouraged heart healthy diet and increasing exercise to 30 minutes most days of the week, going no more than 2 days in a row without exercise. 4. Check BP 1-2 x per week at home, keep log, and bring to clinic at next appointment. 5. Follow up 3 months.

## 2020-03-27 NOTE — Assessment & Plan Note (Signed)
Reports stable and well controlled with flonase as needed.  Will send in refill to pharmacy on file.

## 2020-08-01 ENCOUNTER — Other Ambulatory Visit: Payer: Self-pay | Admitting: *Deleted

## 2020-08-01 ENCOUNTER — Ambulatory Visit: Payer: Self-pay | Admitting: *Deleted

## 2020-08-01 DIAGNOSIS — K219 Gastro-esophageal reflux disease without esophagitis: Secondary | ICD-10-CM

## 2020-08-01 MED ORDER — OMEPRAZOLE 40 MG PO CPDR: 40.0000 mg | DELAYED_RELEASE_CAPSULE | Freq: Every day | ORAL | 0 refills | Status: DC

## 2020-08-01 NOTE — Telephone Encounter (Signed)
Triages for abdominal pain- out of Omeprazole- request for RF.

## 2020-08-01 NOTE — Telephone Encounter (Signed)
Patient is calling to report she has had chronic upper abdominal pain for years without any definitive diagnosis- IBS was last discussed. Patient reports her pain has increased in the last 2-3 days. Patient has not been taking the omeprazole - she states she ran out and she states she has tried changing her diet more fruits/vegtables. Patient states the pain is more like a cramping pain- feels almost constant- mild/moderate. Patient reports no changes in bowel- although she states she does have bloating and gas increase.  Patient has been scheduled for next week- advised patient- UC/ED if her symptoms become worse. Will request RF of her omeprazole. Will send message for review to see if she can be seen sooner in the office as appointment is not within disposition.   Reason for Disposition . Age > 60 years  Answer Assessment - Initial Assessment Questions 1. LOCATION: "Where does it hurt?"      Upper R and L pain 2. RADIATION: "Does the pain shoot anywhere else?" (e.g., chest, back)     Feels like a big 3. ONSET: "When did the pain begin?" (e.g., minutes, hours or days ago)      Years- getting worse over 3-4 days 4. SUDDEN: "Gradual or sudden onset?"     Cramping- constant 5. PATTERN "Does the pain come and go, or is it constant?"    - If constant: "Is it getting better, staying the same, or worsening?"      (Note: Constant means the pain never goes away completely; most serious pain is constant and it progresses)     - If intermittent: "How long does it last?" "Do you have pain now?"     (Note: Intermittent means the pain goes away completely between bouts)     Hours at a time 6. SEVERITY: "How bad is the pain?"  (e.g., Scale 1-10; mild, moderate, or severe)   - MILD (1-3): doesn't interfere with normal activities, abdomen soft and not tender to touch    - MODERATE (4-7): interferes with normal activities or awakens from sleep, tender to touch    - SEVERE (8-10): excruciating pain, doubled  over, unable to do any normal activities      Mild/moderate 7. RECURRENT SYMPTOM: "Have you ever had this type of stomach pain before?" If Yes, ask: "When was the last time?" and "What happened that time?"      Present for years 8. CAUSE: "What do you think is causing the stomach pain?"     Unsure- patient was given medication- didn't help 9. RELIEVING/AGGRAVATING FACTORS: "What makes it better or worse?" (e.g., movement, antacids, bowel movement)     No changes 10. OTHER SYMPTOMS: "Has there been any vomiting, diarrhea, constipation, or urine problems?"      no 11. PREGNANCY: "Is there any chance you are pregnant?" "When was your last menstrual period?"       n/a  Protocols used: ABDOMINAL PAIN - [facility]

## 2020-08-07 ENCOUNTER — Encounter: Payer: Self-pay | Admitting: Family Medicine

## 2020-08-07 ENCOUNTER — Ambulatory Visit (INDEPENDENT_AMBULATORY_CARE_PROVIDER_SITE_OTHER): Payer: Medicare HMO | Admitting: Family Medicine

## 2020-08-07 ENCOUNTER — Other Ambulatory Visit: Payer: Self-pay

## 2020-08-07 VITALS — BP 140/78 | HR 72 | Ht 64.0 in | Wt 142.6 lb

## 2020-08-07 DIAGNOSIS — D75839 Thrombocytosis, unspecified: Secondary | ICD-10-CM | POA: Diagnosis not present

## 2020-08-07 DIAGNOSIS — K219 Gastro-esophageal reflux disease without esophagitis: Secondary | ICD-10-CM | POA: Diagnosis not present

## 2020-08-07 DIAGNOSIS — K580 Irritable bowel syndrome with diarrhea: Secondary | ICD-10-CM

## 2020-08-07 DIAGNOSIS — R69 Illness, unspecified: Secondary | ICD-10-CM | POA: Diagnosis not present

## 2020-08-07 DIAGNOSIS — F419 Anxiety disorder, unspecified: Secondary | ICD-10-CM | POA: Diagnosis not present

## 2020-08-07 DIAGNOSIS — R7989 Other specified abnormal findings of blood chemistry: Secondary | ICD-10-CM | POA: Diagnosis not present

## 2020-08-07 MED ORDER — PANTOPRAZOLE SODIUM 40 MG PO TBEC: 40.0000 mg | DELAYED_RELEASE_TABLET | Freq: Every day | ORAL | 1 refills | Status: DC

## 2020-08-07 MED ORDER — ESCITALOPRAM OXALATE 10 MG PO TABS: 10.0000 mg | ORAL_TABLET | Freq: Every day | ORAL | 3 refills | Status: DC

## 2020-08-07 NOTE — Patient Instructions (Addendum)
Thank you for coming to the office today.  Check iGuard bottle see if you can take more medication on this one, can be up to 3 months.  Labs today, platelet again, if need can refer to Hematology  Stop Omeprazole. Start Protonix generic 40mg  daily  Start Escitalopram (Lexapro) 10mg  daily with meal, can take few weeks for full effect  Consider GI consultation   Please schedule a Follow-up Appointment to: Return in about 6 weeks (around 09/18/2020) for 4-6 weeks follow-up Rollene Fare w/ new provider for IBS, Platelets.  If you have any other questions or concerns, please feel free to call the office or send a message through [place]. You may also schedule an earlier appointment if necessary.  Additionally, you may be receiving a survey about your experience at our office within a few days to 1 week by e-mail or mail. We value your feedback.  Nobie Putnam, DO [place]

## 2020-08-07 NOTE — Progress Notes (Signed)
Subjective:    Patient ID: Tiffany Richardson, female    DOB: 1945/12/25, 75 y.o.   MRN: [id number]  Tiffany Richardson is a 75 y.o. female presenting on 08/07/2020 for Abdominal Pain   HPI   IBS / Cramping Anxiety Recent flare up now in past few weeks. Question if fruits / nuts or sodas worsened it. She has reduced soda and these things recently. History of spastic colon. Previously treated with Dicyclomine PRN in past 2019 by prior PCP, without any improvement. Takes Citrucel daily PRN.  She used to take Protonix in past, seemed to help. Now on Omeprazole not as helpful. Husband passed from colon cancer in past. She has tried Amitriptyline in the past without success. Never tried SSRI  ELevated Platelets Prior labs for years high platelet 500-800. Last PCP ordered Hematology referral but patient was unable to schedule it. Not ready to go back she would like repeat lab   Depression screen [facility] 2/9 07/05/2019 10/10/2016 04/28/2015  Decreased Interest 0 0 0  Down, Depressed, Hopeless 0 0 0  PHQ - 2 Score 0 0 0    Social History   Tobacco Use  . Smoking status: Never Smoker  . Smokeless tobacco: Never Used  Substance Use Topics  . Alcohol use: No    Alcohol/week: 0.0 standard drinks  . Drug use: No    Review of Systems Per HPI unless specifically indicated above     Objective:    BP 140/78   Pulse 72   Ht 5\' 4"  (1.626 m)   Wt 142 lb 9.6 oz (64.7 kg)   SpO2 99%   BMI 24.48 kg/m   Wt Readings from Last 3 Encounters:  08/07/20 142 lb 9.6 oz (64.7 kg)  07/05/19 144 lb 6.4 oz (65.5 kg)  09/18/17 150 lb 6.4 oz (68.2 kg)    Physical Exam Vitals and nursing note reviewed.  Constitutional:      General: She is not in acute distress.    Appearance: She is well-developed. She is not diaphoretic.     Comments: Well-appearing, comfortable, cooperative  HENT:     Head: Normocephalic and atraumatic.  Eyes:     General:        Right eye: No discharge.        Left eye: No discharge.      Conjunctiva/sclera: Conjunctivae normal.  Neck:     Thyroid: No thyromegaly.  Cardiovascular:     Rate and Rhythm: Normal rate and regular rhythm.     Heart sounds: Normal heart sounds. No murmur heard.   Pulmonary:     Effort: Pulmonary effort is normal. No respiratory distress.     Breath sounds: Normal breath sounds. No wheezing or rales.  Abdominal:     General: Bowel sounds are normal.     Tenderness: There is no abdominal tenderness.  Musculoskeletal:        General: Normal range of motion.     Cervical back: Normal range of motion and neck supple.  Lymphadenopathy:     Cervical: No cervical adenopathy.  Skin:    General: Skin is warm and dry.     Findings: No erythema or rash.  Neurological:     Mental Status: She is alert and oriented to person, place, and time.  Psychiatric:        Behavior: Behavior normal.     Comments: Well groomed, good eye contact, normal speech and thoughts    Results for orders placed or performed  in visit on 07/06/19  CBC with Differential  Result Value Ref Range   WBC 8.9 3.8 - 10.8 Thousand/uL   RBC 5.04 3.80 - 5.10 Million/uL   Hemoglobin 14.6 11.7 - 15.5 g/dL   HCT 44.8 35.0 - 45.0 %   MCV 88.9 80.0 - 100.0 fL   MCH 29.0 27.0 - 33.0 pg   MCHC 32.6 32.0 - 36.0 g/dL   RDW 13.4 11.0 - 15.0 %   Platelets 819 (H) 140 - 400 Thousand/uL   MPV 10.0 7.5 - 12.5 fL   Neutro Abs 4,975 1,500 - 7,800 cells/uL   Lymphs Abs 2,875 850 - 3,900 cells/uL   Absolute Monocytes 614 200 - 950 cells/uL   Eosinophils Absolute 303 15 - 500 cells/uL   Basophils Absolute 134 0 - 200 cells/uL   Neutrophils Relative % 55.9 %   Total Lymphocyte 32.3 %   Monocytes Relative 6.9 %   Eosinophils Relative 3.4 %   Basophils Relative 1.5 %  COMPLETE METABOLIC PANEL WITH GFR  Result Value Ref Range   Glucose, Bld 92 65 - 99 mg/dL   BUN 12 7 - 25 mg/dL   Creat 0.98 (H) 0.60 - 0.93 mg/dL   GFR, Est Non African American 58 (L) > OR = 60 mL/min/1.63m2   GFR,  Est African American 67 > OR = 60 mL/min/1.67m2   BUN/Creatinine Ratio 12 6 - 22 (calc)   Sodium 141 135 - 146 mmol/L   Potassium 5.4 (H) 3.5 - 5.3 mmol/L   Chloride 105 98 - 110 mmol/L   CO2 31 20 - 32 mmol/L   Calcium 9.6 8.6 - 10.4 mg/dL   Total Protein 6.4 6.1 - 8.1 g/dL   Albumin 3.9 3.6 - 5.1 g/dL   Globulin 2.5 1.9 - 3.7 g/dL (calc)   AG Ratio 1.6 1.0 - 2.5 (calc)   Total Bilirubin 0.8 0.2 - 1.2 mg/dL   Alkaline phosphatase (APISO) 137 37 - 153 U/L   AST 17 10 - 35 U/L   ALT 12 6 - 29 U/L      Assessment & Plan:   Problem List Items Addressed This Visit    IBS (irritable bowel syndrome) - Primary   Relevant Medications   pantoprazole (PROTONIX) 40 MG tablet   escitalopram (LEXAPRO) 10 MG tablet   Other Relevant Orders   COMPLETE METABOLIC PANEL WITH GFR   Gastroesophageal reflux disease without esophagitis   Relevant Medications   pantoprazole (PROTONIX) 40 MG tablet   Other Relevant Orders   COMPLETE METABOLIC PANEL WITH GFR   Elevated platelet count   Relevant Orders   CBC with Differential/Platelet    Other Visit Diagnoses    Anxiety       Relevant Medications   escitalopram (LEXAPRO) 10 MG tablet      #IBS-D Anxiety GERD Chronic problems, limited success in past. Tried and failed Dicyclomine, Amitriptyline, has had GI consult, colonoscopy, PPIs  Improve don iGuard Peppermint Oil - advised inc dose to max if possible TID Continue fiber/supplements, dietary improvements Switch Omeprazole 40mg  to Pantoprazole 40mg  - had better results in past.  Trial on Escitalopram 10mg  daily SSRI for anxiety/IBS - counseling on dosing, and timeline to onset  #Elevated platelets Chronic problem for years, prior results 500-800 Previous referral Hematology was never scheduled, she could not attend Check lab again today CBC and refer if she is ready  Meds ordered this encounter  Medications  . pantoprazole (PROTONIX) 40 MG tablet    Sig:  Take 1 tablet (40 mg total)  by mouth daily before breakfast.    Dispense:  90 tablet    Refill:  1  . escitalopram (LEXAPRO) 10 MG tablet    Sig: Take 1 tablet (10 mg total) by mouth daily.    Dispense:  30 tablet    Refill:  3      Follow up plan: Return in about 6 weeks (around 09/18/2020) for 4-6 weeks follow-up Rollene Fare w/ new provider for IBS, Platelets.   Nobie Putnam, [facility] 08/07/2020, 9:35 AM

## 2020-08-08 LAB — CBC WITH DIFFERENTIAL/PLATELET
Absolute Monocytes: 614 cells/uL (ref 200–950)
Basophils Absolute: 149 cells/uL (ref 0–200)
Basophils Relative: 1.6 %
Eosinophils Absolute: 391 cells/uL (ref 15–500)
Eosinophils Relative: 4.2 %
HCT: 45.9 % — ABNORMAL HIGH (ref 35.0–45.0)
Hemoglobin: 14.3 g/dL (ref 11.7–15.5)
Lymphs Abs: 2567 cells/uL (ref 850–3900)
MCH: 26.7 pg — ABNORMAL LOW (ref 27.0–33.0)
MCHC: 31.2 g/dL — ABNORMAL LOW (ref 32.0–36.0)
MCV: 85.8 fL (ref 80.0–100.0)
MPV: 10.1 fL (ref 7.5–12.5)
Monocytes Relative: 6.6 %
Neutro Abs: 5580 cells/uL (ref 1500–7800)
Neutrophils Relative %: 60 %
Platelets: 904 10*3/uL — ABNORMAL HIGH (ref 140–400)
RBC: 5.35 10*6/uL — ABNORMAL HIGH (ref 3.80–5.10)
RDW: 13.7 % (ref 11.0–15.0)
Total Lymphocyte: 27.6 %
WBC: 9.3 10*3/uL (ref 3.8–10.8)

## 2020-08-08 LAB — COMPLETE METABOLIC PANEL WITH GFR
AG Ratio: 1.5 (calc) (ref 1.0–2.5)
ALT: 10 U/L (ref 6–29)
AST: 16 U/L (ref 10–35)
Albumin: 4 g/dL (ref 3.6–5.1)
Alkaline phosphatase (APISO): 165 U/L — ABNORMAL HIGH (ref 37–153)
BUN: 15 mg/dL (ref 7–25)
CO2: 29 mmol/L (ref 20–32)
Calcium: 9.2 mg/dL (ref 8.6–10.4)
Chloride: 105 mmol/L (ref 98–110)
Creat: 0.9 mg/dL (ref 0.60–0.93)
GFR, Est African American: 74 mL/min/{1.73_m2} (ref >=60)
GFR, Est Non African American: 63 mL/min/{1.73_m2} (ref >=60)
Globulin: 2.7 g/dL (calc) (ref 1.9–3.7)
Glucose, Bld: 88 mg/dL (ref 65–99)
Potassium: 5.3 mmol/L (ref 3.5–5.3)
Sodium: 142 mmol/L (ref 135–146)
Total Bilirubin: 0.7 mg/dL (ref 0.2–1.2)
Total Protein: 6.7 g/dL (ref 6.1–8.1)

## 2020-08-08 NOTE — Addendum Note (Signed)
Addended by: Olin Hauser on: 08/08/2020 05:51 PM   Modules accepted: Orders

## 2020-08-15 ENCOUNTER — Inpatient Hospital Stay: Payer: Medicare HMO

## 2020-08-15 ENCOUNTER — Telehealth: Payer: Self-pay

## 2020-08-15 ENCOUNTER — Inpatient Hospital Stay: Payer: Medicare HMO | Attending: Oncology | Admitting: Oncology

## 2020-08-15 ENCOUNTER — Encounter: Payer: Self-pay | Admitting: Oncology

## 2020-08-15 VITALS — BP 137/81 | HR 66 | Temp 98.1°F | Resp 18 | Ht 64.0 in | Wt 141.0 lb

## 2020-08-15 DIAGNOSIS — D696 Thrombocytopenia, unspecified: Secondary | ICD-10-CM

## 2020-08-15 DIAGNOSIS — Z8249 Family history of ischemic heart disease and other diseases of the circulatory system: Secondary | ICD-10-CM | POA: Diagnosis not present

## 2020-08-15 DIAGNOSIS — D75839 Thrombocytosis, unspecified: Secondary | ICD-10-CM

## 2020-08-15 DIAGNOSIS — I10 Essential (primary) hypertension: Secondary | ICD-10-CM | POA: Diagnosis not present

## 2020-08-15 DIAGNOSIS — Z79899 Other long term (current) drug therapy: Secondary | ICD-10-CM

## 2020-08-15 DIAGNOSIS — R140 Abdominal distension (gaseous): Secondary | ICD-10-CM | POA: Diagnosis not present

## 2020-08-15 DIAGNOSIS — Z7982 Long term (current) use of aspirin: Secondary | ICD-10-CM | POA: Diagnosis not present

## 2020-08-15 DIAGNOSIS — Z807 Family history of other malignant neoplasms of lymphoid, hematopoietic and related tissues: Secondary | ICD-10-CM

## 2020-08-15 DIAGNOSIS — K589 Irritable bowel syndrome without diarrhea: Secondary | ICD-10-CM | POA: Insufficient documentation

## 2020-08-15 DIAGNOSIS — Z806 Family history of leukemia: Secondary | ICD-10-CM

## 2020-08-15 DIAGNOSIS — Z832 Family history of diseases of the blood and blood-forming organs and certain disorders involving the immune mechanism: Secondary | ICD-10-CM | POA: Diagnosis not present

## 2020-08-15 DIAGNOSIS — D473 Essential (hemorrhagic) thrombocythemia: Secondary | ICD-10-CM | POA: Diagnosis not present

## 2020-08-15 LAB — CBC WITH DIFFERENTIAL/PLATELET
Abs Immature Granulocytes: 0.03 10*3/uL (ref 0.00–0.07)
Basophils Absolute: 0.1 10*3/uL (ref 0.0–0.1)
Basophils Relative: 1 %
Eosinophils Absolute: 0.3 10*3/uL (ref 0.0–0.5)
Eosinophils Relative: 3 %
HCT: 43.9 % (ref 36.0–46.0)
Hemoglobin: 14.3 g/dL (ref 12.0–15.0)
Immature Granulocytes: 0 %
Lymphocytes Relative: 26 %
Lymphs Abs: 2.6 10*3/uL (ref 0.7–4.0)
MCH: 28.3 pg (ref 26.0–34.0)
MCHC: 32.6 g/dL (ref 30.0–36.0)
MCV: 86.8 fL (ref 80.0–100.0)
Monocytes Absolute: 0.7 10*3/uL (ref 0.1–1.0)
Monocytes Relative: 7 %
Neutro Abs: 6 10*3/uL (ref 1.7–7.7)
Neutrophils Relative %: 63 %
Platelets: 815 10*3/uL — ABNORMAL HIGH (ref 150–400)
RBC: 5.06 MIL/uL (ref 3.87–5.11)
RDW: 14.9 % (ref 11.5–15.5)
WBC: 9.8 10*3/uL (ref 4.0–10.5)
nRBC: 0 % (ref 0.0–0.2)

## 2020-08-15 LAB — IRON AND TIBC
Iron: 85 ug/dL (ref 28–170)
Saturation Ratios: 25 % (ref 10.4–31.8)
TIBC: 337 ug/dL (ref 250–450)
UIBC: 252 ug/dL

## 2020-08-15 LAB — FERRITIN: Ferritin: 31 ng/mL (ref 11–307)

## 2020-08-15 LAB — LACTATE DEHYDROGENASE: LDH: 144 U/L (ref 98–192)

## 2020-08-15 LAB — PATHOLOGIST SMEAR REVIEW

## 2020-08-15 NOTE — Progress Notes (Signed)
Pt here to establish care. Pt reports fulness to abdomen.

## 2020-08-15 NOTE — Progress Notes (Signed)
Hematology/Oncology Consult note [facility] [phone] [phone]   Patient Care Team: Verl Bangs, FNP as PCP - General (Family Medicine)  REFERRING PROVIDER: Nobie Putnam *  CHIEF COMPLAINTS/REASON FOR VISIT:  Evaluation of thrombocytosis  HISTORY OF PRESENTING ILLNESS:  Tiffany Richardson is a 75 y.o. female who was seen in consultation at the request of Nobie Putnam * for evaluation of thromcbocytosis Reviewed patient's labs.  08/07/2020 labs showed elevated platelet counts at 904,000.  Normal wbc  and hemoglobin   Reviewed patient's previous labs. Thrombocytosis onset is chronic onset , duration since at least 2017 No aggravating or elevated factors. Associated symptoms or signs:  Denies weight loss, fever, chills, fatigue, night sweats.  Context:  Smoking history: Denies Family history of polycythemia.  Denies.  However his father had a history of needing frequent blood removal History of iron deficiency anemia: Denies History of DVT: Denies She also reports chronic abdominal bloating.  She has IBS.  Denies any unintentional weight loss, fever, nausea vomiting.  No recent colonoscopy in current EMR. She is widowed, husband had colon cancer. Review of Systems  Constitutional: Negative for appetite change, chills, fatigue and fever.  HENT:   Negative for hearing loss and voice change.   Eyes: Negative for eye problems.  Respiratory: Negative for chest tightness and cough.   Cardiovascular: Negative for chest pain.  Gastrointestinal: Negative for abdominal distention, abdominal pain and blood in stool.  Endocrine: Negative for hot flashes.  Genitourinary: Negative for difficulty urinating and frequency.   Musculoskeletal: Negative for arthralgias.  Skin: Negative for itching and rash.  Neurological: Negative for extremity weakness.  Hematological: Negative for adenopathy.  Psychiatric/Behavioral: Negative for  confusion.    MEDICAL HISTORY:  Past Medical History:  Diagnosis Date  . Allergy   . Cancer ([place])    basal cell  . Emphysema of lung ([place])   . Hypertension     SURGICAL HISTORY: Past Surgical History:  Procedure Laterality Date  . basal cell removed     Rt eye  . squamous cell removed       SOCIAL HISTORY: Social History   Socioeconomic History  . Marital status: Widowed    Spouse name: Not on file  . Number of children: Not on file  . Years of education: Not on file  . Highest education level: Not on file  Occupational History  . Not on file  Tobacco Use  . Smoking status: Never Smoker  . Smokeless tobacco: Never Used  Vaping Use  . Vaping Use: Never used  Substance and Sexual Activity  . Alcohol use: No    Alcohol/week: 0.0 standard drinks  . Drug use: No  . Sexual activity: Not on file  Other Topics Concern  . Not on file  Social History Narrative  . Not on file   Social Determinants of Health   Financial Resource Strain: Not on file  Food Insecurity: Not on file  Transportation Needs: Not on file  Physical Activity: Not on file  Stress: Not on file  Social Connections: Not on file  Intimate Partner Violence: Not on file    FAMILY HISTORY: Family History  Problem Relation Age of Onset  . Leukemia Mother   . Heart disease Father   . Non-Hodgkin's lymphoma Daughter     ALLERGIES:  is allergic to codeine.  MEDICATIONS:  Current Outpatient Medications  Medication Sig Dispense Refill  . aspirin EC 81 MG tablet Take 81 mg by mouth.    Marland Kitchen  atorvastatin (LIPITOR) 20 MG tablet Take 1 tablet (20 mg total) by mouth daily. 90 tablet 1  . fluticasone (FLONASE) 50 MCG/ACT nasal spray SPRAY 2 SPRAYS INTO EACH NOSTRIL EVERY DAY 48 mL 1  . metoprolol succinate (TOPROL-XL) 50 MG 24 hr tablet TAKE 1 TABLET (50 MG TOTAL) BY MOUTH DAILY. TAKE WITH OR IMMEDIATELY FOLLOWING A MEAL. 90 tablet 1  . omeprazole (PRILOSEC) 40 MG capsule Take 40 mg by mouth daily.    Marland Kitchen  escitalopram (LEXAPRO) 10 MG tablet Take 1 tablet (10 mg total) by mouth daily. (Patient not taking: Reported on 08/15/2020) 30 tablet 3  . pantoprazole (PROTONIX) 40 MG tablet Take 1 tablet (40 mg total) by mouth daily before breakfast. (Patient not taking: Reported on 08/15/2020) 90 tablet 1   No current facility-administered medications for this visit.     PHYSICAL EXAMINATION: ECOG PERFORMANCE STATUS: 0 - Asymptomatic Vitals:   08/15/20 0931  BP: 137/81  Pulse: 66  Resp: 18  Temp: 98.1 F (36.7 C)   Filed Weights   08/15/20 0931  Weight: 141 lb (64 kg)    Physical Exam Constitutional:      General: She is not in acute distress. HENT:     Head: Normocephalic and atraumatic.  Eyes:     General: No scleral icterus. Cardiovascular:     Rate and Rhythm: Normal rate and regular rhythm.     Heart sounds: Normal heart sounds.  Pulmonary:     Effort: Pulmonary effort is normal. No respiratory distress.     Breath sounds: No wheezing.  Abdominal:     General: Bowel sounds are normal. There is no distension.     Palpations: Abdomen is soft.  Musculoskeletal:        General: No deformity. Normal range of motion.     Cervical back: Normal range of motion and neck supple.  Skin:    General: Skin is warm and dry.     Findings: No erythema or rash.  Neurological:     Mental Status: She is alert and oriented to person, place, and time. Mental status is at baseline.     Cranial Nerves: No cranial nerve deficit.     Coordination: Coordination normal.  Psychiatric:        Mood and Affect: Mood normal.      LABORATORY DATA:  I have reviewed the data as listed Lab Results  Component Value Date   WBC 9.8 08/15/2020   HGB 14.3 08/15/2020   HCT 43.9 08/15/2020   MCV 86.8 08/15/2020   PLT 815 (H) 08/15/2020   Recent Labs    08/07/20 1001  NA 142  K 5.3  CL 105  CO2 29  GLUCOSE 88  BUN 15  CREATININE 0.90  CALCIUM 9.2  GFRNONAA 63  GFRAA 74  PROT 6.7  AST 16  ALT  10  BILITOT 0.7   Iron/TIBC/Ferritin/ %Sat    Component Value Date/Time   IRON 64 12/11/2015 1436   IRON 60 05/29/2015 1404   TIBC 272 12/11/2015 1436   TIBC 237 (L) 05/29/2015 1404   FERRITIN 63 12/11/2015 1436   FERRITIN 136 05/29/2015 1404   IRONPCTSAT 24 12/11/2015 1436      RADIOGRAPHIC STUDIES: I have personally reviewed the radiological images as listed and agreed with the findings in the report. No results found.    ASSESSMENT & PLAN:  1. Thrombocytosis   2. Bloating    I discussed with patient that the differential diagnosis of the  thrombocytosis is broad, including benign etiology such as reactive to surgery, trauma, infection, nutrition deficiency, etc, as well as malignant etiology including underlying bone marrow disorders.   For the work up of patient's thrombocytosis, I recommend checking CBC;CMP, LDH, pathology smear review, iron,TIBC, ferritin and JAK 2 mutation,  BCR ABL; MPL; CALR mutation.  I discussed the potential concerns for stroke with elevated platelets. Patient is on aspirin.  I recommended bone marrow biopsy for further evaluation.  Chronic bloating and abdominal discomfort, possible due to chronic IBS symptoms.  Last colonoscopy 2013. Patient was seen by Dr. Vira Agar in 2019 and was recommended to increase soluble fiber, MiraLAX, if not helpful trial of FODMAP diet and probiotics.  Recommend surveillance colonoscopy.  We will further discuss with patient at next visit.  Orders Placed This Encounter  Procedures  . CT BONE MARROW BIOPSY & ASPIRATION    Standing Status:   Future    Standing Expiration Date:   08/15/2021    Order Specific Question:   Reason for Exam (SYMPTOM  OR DIAGNOSIS REQUIRED)    Answer:   thrombocytopenia    Order Specific Question:   Preferred location?    Answer:   [facility]    Order Specific Question:   Radiology Contrast Protocol - do NOT remove file path    Answer:    [email or web address]  . CBC with Differential/Platelet    Standing Status:   Future    Number of Occurrences:   1    Standing Expiration Date:   08/15/2021  . JAK2 V617F, w Reflex to CALR/E12/MPL    Standing Status:   Future    Number of Occurrences:   1    Standing Expiration Date:   08/15/2021  . BCR-ABL1 FISH    Standing Status:   Future    Number of Occurrences:   1    Standing Expiration Date:   08/15/2021  . Pathologist smear review    Standing Status:   Future    Number of Occurrences:   1    Standing Expiration Date:   08/15/2021  . Lactate dehydrogenase    Standing Status:   Future    Number of Occurrences:   1    Standing Expiration Date:   08/15/2021  . Ferritin  . Iron and TIBC    All questions were answered. The patient knows to call the clinic with any problems questions or concerns.  Cc Karamalegos, [place] Co[place] *  Return of visit: 1 week after bone marrow biopsy Thank you for this kind referral and the opportunity to participate in the care of this patient. A copy of today's note is routed to referring provider     Earlie Server, MD, PhD 08/15/2020

## 2020-08-15 NOTE — Telephone Encounter (Signed)
Order for bone marrow biopsy entered in computer and faxed to specialty scheduling.  MD will see patient 1 week after BMB.

## 2020-08-16 NOTE — Telephone Encounter (Signed)
08/16/2020 Informed pt about f/u MD appt after BMB, scheduled for 08/28/20 @ 11. Pt confirmed appt SRW

## 2020-08-16 NOTE — Telephone Encounter (Signed)
BMB scheduled for 08/21/20 arrive at 7:30 am for 8:30 am appointment.    Please schedule patient 1 week after bx for MD to discuss results.

## 2020-08-17 ENCOUNTER — Other Ambulatory Visit: Payer: Self-pay | Admitting: Radiology

## 2020-08-18 ENCOUNTER — Other Ambulatory Visit: Payer: Self-pay | Admitting: Radiology

## 2020-08-18 LAB — BCR-ABL1 FISH
Cells Analyzed: 200
Cells Counted: 200

## 2020-08-18 NOTE — Progress Notes (Signed)
Patient on schedule for BMB 08/21/2020, unable to reach patient on phone, contacted son in law, with instructions given to be NPO after MN prior to procedure.be here @ 0730,and driver post procedure/discharge. Stated understanding and will relay this to patient. Encouraged him to have her call me if questions.

## 2020-08-21 ENCOUNTER — Other Ambulatory Visit: Payer: Self-pay

## 2020-08-21 ENCOUNTER — Ambulatory Visit
Admission: RE | Admit: 2020-08-21 | Discharge: 2020-08-21 | Disposition: A | Discharge status: Home / Self Care | Payer: Medicare HMO | Source: Ambulatory Visit | Attending: Oncology | Admitting: Oncology

## 2020-08-21 DIAGNOSIS — I10 Essential (primary) hypertension: Secondary | ICD-10-CM | POA: Diagnosis not present

## 2020-08-21 DIAGNOSIS — J439 Emphysema, unspecified: Secondary | ICD-10-CM | POA: Diagnosis not present

## 2020-08-21 DIAGNOSIS — D696 Thrombocytopenia, unspecified: Secondary | ICD-10-CM | POA: Diagnosis present

## 2020-08-21 DIAGNOSIS — Z7982 Long term (current) use of aspirin: Secondary | ICD-10-CM | POA: Diagnosis not present

## 2020-08-21 DIAGNOSIS — Z79899 Other long term (current) drug therapy: Secondary | ICD-10-CM | POA: Insufficient documentation

## 2020-08-21 DIAGNOSIS — D7589 Other specified diseases of blood and blood-forming organs: Secondary | ICD-10-CM | POA: Diagnosis not present

## 2020-08-21 DIAGNOSIS — D75839 Thrombocytosis, unspecified: Secondary | ICD-10-CM | POA: Diagnosis not present

## 2020-08-21 DIAGNOSIS — D751 Secondary polycythemia: Secondary | ICD-10-CM | POA: Diagnosis not present

## 2020-08-21 DIAGNOSIS — D72829 Elevated white blood cell count, unspecified: Secondary | ICD-10-CM | POA: Diagnosis not present

## 2020-08-21 DIAGNOSIS — Z885 Allergy status to narcotic agent status: Secondary | ICD-10-CM | POA: Diagnosis not present

## 2020-08-21 LAB — CBC WITH DIFFERENTIAL/PLATELET
Abs Immature Granulocytes: 0.06 10*3/uL (ref 0.00–0.07)
Basophils Absolute: 0.1 10*3/uL (ref 0.0–0.1)
Basophils Relative: 1 %
Eosinophils Absolute: 0.3 10*3/uL (ref 0.0–0.5)
Eosinophils Relative: 3 %
HCT: 44.7 % (ref 36.0–46.0)
Hemoglobin: 14.6 g/dL (ref 12.0–15.0)
Immature Granulocytes: 1 %
Lymphocytes Relative: 28 %
Lymphs Abs: 3.1 10*3/uL (ref 0.7–4.0)
MCH: 28.2 pg (ref 26.0–34.0)
MCHC: 32.7 g/dL (ref 30.0–36.0)
MCV: 86.3 fL (ref 80.0–100.0)
Monocytes Absolute: 0.8 10*3/uL (ref 0.1–1.0)
Monocytes Relative: 7 %
Neutro Abs: 6.9 10*3/uL (ref 1.7–7.7)
Neutrophils Relative %: 60 %
Platelets: 881 10*3/uL — ABNORMAL HIGH (ref 150–400)
RBC: 5.18 MIL/uL — ABNORMAL HIGH (ref 3.87–5.11)
RDW: 15 % (ref 11.5–15.5)
WBC: 11.2 10*3/uL — ABNORMAL HIGH (ref 4.0–10.5)
nRBC: 0 % (ref 0.0–0.2)

## 2020-08-21 MED ORDER — MIDAZOLAM HCL 2 MG/2ML IJ SOLN
INTRAMUSCULAR | Status: AC | PRN
  Administered 2020-08-21: 1 mg via INTRAVENOUS

## 2020-08-21 MED ORDER — SODIUM CHLORIDE 0.9 % IV SOLN: INTRAVENOUS | Status: DC

## 2020-08-21 MED ORDER — FENTANYL CITRATE (PF) 100 MCG/2ML IJ SOLN
INTRAMUSCULAR | Status: AC | PRN
  Administered 2020-08-21: 50 ug via INTRAVENOUS

## 2020-08-21 NOTE — Progress Notes (Signed)
Patient clinically stable post BMB per DR Pascal Lux, tolerated well. Awake/alert and oriented post procedure. Denies complaints at this time. Received Versed 1 mg along with Fentanyl 50 mcg IV for procedure. Report given to [facility] RN in specials post procedure.

## 2020-08-21 NOTE — H&P (Signed)
Chief Complaint: Chronic thymocytopenia. Request is for bone marrow biopsy  Referring Physician(s): Yu,Zhou  Supervising Physician: Sandi Mariscal  Patient Status: [facility] - Out-pt  History of Present Illness: Tiffany Richardson is a 75 y.o. female History of with emphysema, HTN, basal cell carcinoma. Found to have chronic thrombocytopenia since 2017. Team is requesting a bone marrow biopsy for further evaluation.   Currently without any significant complaints. Patient alert and laying in bed, calm and comfortable.Endorses abdominal bloating.  Denies any fevers, headache, chest pain, SOB, cough, nausea, vomiting or bleeding. Return precautions and treatment recommendations and follow-up discussed with the patient who is agreeable with the plan.   Past Medical History:  Diagnosis Date  . Allergy   . Cancer ([place])    basal cell  . Emphysema of lung ([place])   . Hypertension     Past Surgical History:  Procedure Laterality Date  . basal cell removed     Rt eye  . squamous cell removed       Allergies: Codeine  Medications: Prior to Admission medications   Medication Sig Start Date End Date Taking? Authorizing Provider  aspirin EC 81 MG tablet Take 81 mg by mouth.    [provider]  atorvastatin (LIPITOR) 20 MG tablet Take 1 tablet (20 mg total) by mouth daily. 07/05/19   Malfi, Lupita Raider, FNP  escitalopram (LEXAPRO) 10 MG tablet Take 1 tablet (10 mg total) by mouth daily. Patient not taking: Reported on 08/15/2020 08/07/20   Olin Hauser, DO  fluticasone [facility]) 50 MCG/ACT nasal spray SPRAY 2 SPRAYS INTO EACH NOSTRIL EVERY DAY 03/27/20   Malfi, Lupita Raider, FNP  metoprolol succinate (TOPROL-XL) 50 MG 24 hr tablet TAKE 1 TABLET (50 MG TOTAL) BY MOUTH DAILY. TAKE WITH OR IMMEDIATELY FOLLOWING A MEAL. 03/24/20   Malfi, Lupita Raider, FNP  omeprazole (PRILOSEC) 40 MG capsule Take 40 mg by mouth daily.    [provider]  pantoprazole (PROTONIX) 40 MG tablet Take 1  tablet (40 mg total) by mouth daily before breakfast. Patient not taking: Reported on 08/15/2020 08/07/20   Olin Hauser, DO     Family History  Problem Relation Age of Onset  . Leukemia Mother   . Heart disease Father   . Non-Hodgkin's lymphoma Daughter     Social History   Socioeconomic History  . Marital status: Widowed    Spouse name: Not on file  . Number of children: Not on file  . Years of education: Not on file  . Highest education level: Not on file  Occupational History  . Not on file  Tobacco Use  . Smoking status: Never Smoker  . Smokeless tobacco: Never Used  Vaping Use  . Vaping Use: Never used  Substance and Sexual Activity  . Alcohol use: No    Alcohol/week: 0.0 standard drinks  . Drug use: No  . Sexual activity: Not on file  Other Topics Concern  . Not on file  Social History Narrative  . Not on file   Social Determinants of Health   Financial Resource Strain: Not on file  Food Insecurity: Not on file  Transportation Needs: Not on file  Physical Activity: Not on file  Stress: Not on file  Social Connections: Not on file      Review of Systems: A 12 point ROS discussed and pertinent positives are indicated in the HPI above.  All other systems are negative.  Review of Systems  Constitutional: Negative for fatigue and  fever.  HENT: Negative for congestion.   Respiratory: Negative for cough and shortness of breath.   Gastrointestinal: Positive for abdominal pain ( described as bloating). Negative for diarrhea, nausea and vomiting.    Vital Signs: BP 129/71   Pulse 66   Temp 98.1 F (36.7 C) (Oral)   Resp (!) 23   Ht '5\' 4"'  (1.626 m)   Wt 141 lb (64 kg)   SpO2 97%   BMI 24.20 kg/m   Physical Exam Vitals and nursing note reviewed.  Constitutional:      Appearance: She is well-developed.  HENT:     Head: Normocephalic and atraumatic.  Eyes:     Conjunctiva/sclera: Conjunctivae normal.  Cardiovascular:     Rate and  Rhythm: Normal rate and regular rhythm.     Heart sounds: Normal heart sounds.  Pulmonary:     Effort: Pulmonary effort is normal.     Breath sounds: Normal breath sounds.  Musculoskeletal:        General: Normal range of motion.     Cervical back: Normal range of motion.  Skin:    General: Skin is warm.  Neurological:     Mental Status: She is alert and oriented to person, place, and time.     Imaging: No results found.  Labs:  CBC: Recent Labs    08/07/20 1001 08/15/20 1005 08/21/20 0744  WBC 9.3 9.8 11.2*  HGB 14.3 14.3 14.6  HCT 45.9* 43.9 44.7  PLT 904* 815* 881*    COAGS: No results for input(s): INR, APTT in the last 8760 hours.  BMP: Recent Labs    08/07/20 1001  NA 142  K 5.3  CL 105  CO2 29  GLUCOSE 88  BUN 15  CALCIUM 9.2  CREATININE 0.90  GFRNONAA 63  GFRAA 74    LIVER FUNCTION TESTS: Recent Labs    08/07/20 1001  BILITOT 0.7  AST 16  ALT 10  PROT 6.7    TUMOR MARKERS: No results for input(s): AFPTM, CEA, CA199, CHROMGRNA in the last 8760 hours.  Assessment and Plan:  75 y.o female outpatient. History of with emphysema, HTN, basal cell carcinoma. Found to have chronic thrombocytopenia since 2017. Team is requesting a bone marrow biopsy for further evaluation.  All labs are within acceptable parameters. On 81 mg of ASA. Allergies include Codeine. Patient has been NPO since midnight.  Risks and benefits of bone marrow biopsy was discussed with the patient and/or patient's family including, but not limited to bleeding, infection, damage to adjacent structures or low yield requiring additional tests.  All of the questions were answered and there is agreement to proceed.  Consent signed and in chart.   Thank you for this interesting consult.  I greatly enjoyed meeting KWEEN BACORN and look forward to participating in their care.  A copy of this report was sent to the requesting provider on this date.  Electronically  Signed: Jacqualine Mau, NP 08/21/2020, 8:08 AM   I spent a total of  30 Minutes   in face to face in clinical consultation, greater than 50% of which was counseling/coordinating care for bone marrow biopsy

## 2020-08-21 NOTE — Procedures (Signed)
Pre-procedure Diagnosis: Thrombocytopenia Post-procedure Diagnosis: Same  Technically successful CT guided bone marrow aspiration and biopsy of left iliac crest.   Complications: None Immediate EBL: None  Signed: Sandi Mariscal Pager: [phone] 08/21/2020, 9:47 AM

## 2020-08-21 NOTE — Discharge Instructions (Signed)
Moderate Conscious Sedation, Adult, Care After This sheet gives you information about how to care for yourself after your procedure. Your health care provider may also give you more specific instructions. If you have problems or questions, contact your health care provider. What can I expect after the procedure? After the procedure, it is common to have:  Sleepiness for several hours.  Impaired judgment for several hours.  Difficulty with balance.  Vomiting if you eat too soon. Follow these instructions at home: For the time period you were told by your health care provider:  Rest.  Do not participate in activities where you could fall or become injured.  Do not drive or use machinery.  Do not drink alcohol.  Do not take sleeping pills or medicines that cause drowsiness.  Do not make important decisions or sign legal documents.  Do not take care of children on your own.      Eating and drinking  Follow the diet recommended by your health care provider.  Drink enough fluid to keep your urine pale yellow.  If you vomit: ? Drink water, juice, or soup when you can drink without vomiting. ? Make sure you have little or no nausea before eating solid foods.   General instructions  Take over-the-counter and prescription medicines only as told by your health care provider.  Have a responsible adult stay with you for the time you are told. It is important to have someone help care for you until you are awake and alert.  Do not smoke.  Keep all follow-up visits as told by your health care provider. This is important. Contact a health care provider if:  You are still sleepy or having trouble with balance after 24 hours.  You feel light-headed.  You keep feeling nauseous or you keep vomiting.  You develop a rash.  You have a fever.  You have redness or swelling around the IV site. Get help right away if:  You have trouble breathing.  You have new-onset confusion at  home. Summary  After the procedure, it is common to feel sleepy, have impaired judgment, or feel nauseous if you eat too soon.  Rest after you get home. Know the things you should not do after the procedure.  Follow the diet recommended by your health care provider and drink enough fluid to keep your urine pale yellow.  Get help right away if you have trouble breathing or new-onset confusion at home. This information is not intended to replace advice given to you by your health care provider. Make sure you discuss any questions you have with your health care provider. Document Revised: 08/06/2019 Document Reviewed: 03/04/2019 Elsevier Patient Education  2021 Elsevier Inc. Bone Marrow Aspiration and Bone Marrow Biopsy, Adult, Care After This sheet gives you information about how to care for yourself after your procedure. Your health care provider may also give you more specific instructions. If you have problems or questions, contact your health care provider. What can I expect after the procedure? After the procedure, it is common to have:  Mild pain and tenderness.  Swelling.  Bruising. Follow these instructions at home: Puncture site care  Follow instructions from your health care provider about how to take care of the puncture site. Make sure you: ? Wash your hands with soap and water before and after you change your bandage (dressing). If soap and water are not available, use hand sanitizer. ? Change your dressing as told by your health care provider.  Check   your puncture site every day for signs of infection. Check for: ? More redness, swelling, or pain. ? Fluid or blood. ? Warmth. ? Pus or a bad smell.   Activity  Return to your normal activities as told by your health care provider. Ask your health care provider what activities are safe for you.  Do not lift anything that is heavier than 10 lb (4.5 kg), or the limit that you are told, until your health care provider  says that it is safe.  Do not drive for 24 hours if you were given a sedative during your procedure. General instructions  Take over-the-counter and prescription medicines only as told by your health care provider.  Do not take baths, swim, or use a hot tub until your health care provider approves. Ask your health care provider if you may take showers. You may only be allowed to take sponge baths.  If directed, put ice on the affected area. To do this: ? Put ice in a plastic bag. ? Place a towel between your skin and the bag. ? Leave the ice on for 20 minutes, 2-3 times a day.  Keep all follow-up visits as told by your health care provider. This is important.   Contact a health care provider if:  Your pain is not controlled with medicine.  You have a fever.  You have more redness, swelling, or pain around the puncture site.  You have fluid or blood coming from the puncture site.  Your puncture site feels warm to the touch.  You have pus or a bad smell coming from the puncture site. Summary  After the procedure, it is common to have mild pain, tenderness, swelling, and bruising.  Follow instructions from your health care provider about how to take care of the puncture site and what activities are safe for you.  Take over-the-counter and prescription medicines only as told by your health care provider.  Contact a health care provider if you have any signs of infection, such as fluid or blood coming from the puncture site. This information is not intended to replace advice given to you by your health care provider. Make sure you discuss any questions you have with your health care provider. Document Revised: 08/25/2018 Document Reviewed: 08/25/2018 Elsevier Patient Education  2021 Elsevier Inc.  

## 2020-08-23 LAB — JAK2 V617F, W REFLEX TO CALR/E12/MPL

## 2020-08-28 ENCOUNTER — Other Ambulatory Visit: Payer: Self-pay

## 2020-08-28 ENCOUNTER — Inpatient Hospital Stay: Payer: Medicare HMO | Attending: Oncology | Admitting: Oncology

## 2020-08-28 ENCOUNTER — Encounter: Payer: Self-pay | Admitting: Oncology

## 2020-08-28 VITALS — BP 149/83 | HR 68 | Temp 96.9°F | Resp 16 | Wt 141.6 lb

## 2020-08-28 DIAGNOSIS — D75839 Thrombocytosis, unspecified: Secondary | ICD-10-CM | POA: Diagnosis not present

## 2020-08-28 DIAGNOSIS — R140 Abdominal distension (gaseous): Secondary | ICD-10-CM

## 2020-08-28 DIAGNOSIS — Z7189 Other specified counseling: Secondary | ICD-10-CM

## 2020-08-28 DIAGNOSIS — Z79899 Other long term (current) drug therapy: Secondary | ICD-10-CM | POA: Diagnosis not present

## 2020-08-28 DIAGNOSIS — D471 Chronic myeloproliferative disease: Secondary | ICD-10-CM | POA: Diagnosis not present

## 2020-08-28 DIAGNOSIS — D473 Essential (hemorrhagic) thrombocythemia: Secondary | ICD-10-CM | POA: Diagnosis not present

## 2020-08-28 DIAGNOSIS — Z7982 Long term (current) use of aspirin: Secondary | ICD-10-CM | POA: Insufficient documentation

## 2020-08-28 MED ORDER — HYDROXYUREA 500 MG PO CAPS: 500.0000 mg | ORAL_CAPSULE | Freq: Every day | ORAL | 0 refills | Status: DC

## 2020-08-28 NOTE — Progress Notes (Signed)
Hematology/Oncology Consult note [facility] [phone] [phone]   Patient Care Team: Malfi, Lupita Raider, FNP as PCP - General (Family Medicine)  REFERRING PROVIDER: Lorine Bears Lupita Raider, FNP  CHIEF COMPLAINTS/REASON FOR VISIT:  Evaluation of thrombocytosis  HISTORY OF PRESENTING ILLNESS:  Tiffany Richardson is a 75 y.o. female who was seen in consultation at the request of Malfi, Lupita Raider, FNP for evaluation of thromcbocytosis Reviewed patient's labs.  08/07/2020 labs showed elevated platelet counts at 904,000.  Normal wbc  and hemoglobin   Reviewed patient's previous labs. Thrombocytosis onset is chronic onset , duration since at least 2017 No aggravating or elevated factors. Associated symptoms or signs:  Denies weight loss, fever, chills, fatigue, night sweats.  Context:  Smoking history: Denies Family history of polycythemia.  Denies.  However his father had a history of needing frequent blood removal History of iron deficiency anemia: Denies History of DVT: Denies She also reports chronic abdominal bloating.  She has IBS.  Denies any unintentional weight loss, fever, nausea vomiting.  No recent colonoscopy in current EMR. She is widowed, husband had colon cancer.  INTERVAL HISTORY Tiffany Richardson is a 75 y.o. female who has above history reviewed by me today presents for follow up visit for management of thrombocytosis Problems and complaints are listed below: During interval patient has had a bone marrow biopsy done.  She present to discuss results and management plan.  Patient is on aspirin 81 mg daily.  Review of Systems  Constitutional: Negative for appetite change, chills, fatigue and fever.  HENT:   Negative for hearing loss and voice change.   Eyes: Negative for eye problems.  Respiratory: Negative for chest tightness and cough.   Cardiovascular: Negative for chest pain.  Gastrointestinal: Negative for abdominal distention, abdominal  pain and blood in stool.  Endocrine: Negative for hot flashes.  Genitourinary: Negative for difficulty urinating and frequency.   Musculoskeletal: Negative for arthralgias.  Skin: Negative for itching and rash.  Neurological: Negative for extremity weakness.  Hematological: Negative for adenopathy.  Psychiatric/Behavioral: Negative for confusion.    MEDICAL HISTORY:  Past Medical History:  Diagnosis Date  . Allergy   . Cancer ([place])    basal cell  . Emphysema of lung ([place])   . Hypertension     SURGICAL HISTORY: Past Surgical History:  Procedure Laterality Date  . basal cell removed     Rt eye  . squamous cell removed       SOCIAL HISTORY: Social History   Socioeconomic History  . Marital status: Widowed    Spouse name: Not on file  . Number of children: Not on file  . Years of education: Not on file  . Highest education level: Not on file  Occupational History  . Not on file  Tobacco Use  . Smoking status: Never Smoker  . Smokeless tobacco: Never Used  Vaping Use  . Vaping Use: Never used  Substance and Sexual Activity  . Alcohol use: No    Alcohol/week: 0.0 standard drinks  . Drug use: No  . Sexual activity: Not on file  Other Topics Concern  . Not on file  Social History Narrative  . Not on file   Social Determinants of Health   Financial Resource Strain: Not on file  Food Insecurity: Not on file  Transportation Needs: Not on file  Physical Activity: Not on file  Stress: Not on file  Social Connections: Not on file  Intimate Partner Violence: Not on  file    FAMILY HISTORY: Family History  Problem Relation Age of Onset  . Leukemia Mother   . Heart disease Father   . Non-Hodgkin's lymphoma Daughter     ALLERGIES:  is allergic to codeine.  MEDICATIONS:  Current Outpatient Medications  Medication Sig Dispense Refill  . aspirin EC 81 MG tablet Take 81 mg by mouth.    Marland Kitchen atorvastatin (LIPITOR) 20 MG tablet Take 1 tablet (20 mg total) by mouth  daily. 90 tablet 1  . fluticasone (FLONASE) 50 MCG/ACT nasal spray SPRAY 2 SPRAYS INTO EACH NOSTRIL EVERY DAY 48 mL 1  . hydroxyurea (HYDREA) 500 MG capsule Take 1 capsule (500 mg total) by mouth daily. May take with food to minimize GI side effects. 30 capsule 0  . metoprolol succinate (TOPROL-XL) 50 MG 24 hr tablet TAKE 1 TABLET (50 MG TOTAL) BY MOUTH DAILY. TAKE WITH OR IMMEDIATELY FOLLOWING A MEAL. 90 tablet 1  . omeprazole (PRILOSEC) 40 MG capsule Take 40 mg by mouth daily.    Marland Kitchen escitalopram (LEXAPRO) 10 MG tablet Take 1 tablet (10 mg total) by mouth daily. (Patient not taking: Reported on 08/15/2020) 30 tablet 3  . pantoprazole (PROTONIX) 40 MG tablet Take 1 tablet (40 mg total) by mouth daily before breakfast. (Patient not taking: Reported on 08/15/2020) 90 tablet 1   No current facility-administered medications for this visit.     PHYSICAL EXAMINATION: ECOG PERFORMANCE STATUS: 0 - Asymptomatic Vitals:   08/28/20 1044  BP: (!) 149/83  Pulse: 68  Resp: 16  Temp: (!) 96.9 F (36.1 C)   Filed Weights   08/28/20 1044  Weight: 141 lb 9.6 oz (64.2 kg)    Physical Exam Constitutional:      General: She is not in acute distress. HENT:     Head: Normocephalic and atraumatic.  Eyes:     General: No scleral icterus. Cardiovascular:     Rate and Rhythm: Normal rate and regular rhythm.     Heart sounds: Normal heart sounds.  Pulmonary:     Effort: Pulmonary effort is normal. No respiratory distress.     Breath sounds: No wheezing.  Abdominal:     General: Bowel sounds are normal. There is no distension.     Palpations: Abdomen is soft.  Musculoskeletal:        General: No deformity. Normal range of motion.     Cervical back: Normal range of motion and neck supple.  Skin:    General: Skin is warm and dry.     Findings: No erythema or rash.  Neurological:     Mental Status: She is alert and oriented to person, place, and time. Mental status is at baseline.     Cranial Nerves:  No cranial nerve deficit.     Coordination: Coordination normal.  Psychiatric:        Mood and Affect: Mood normal.      LABORATORY DATA:  I have reviewed the data as listed Lab Results  Component Value Date   WBC 11.2 (H) 08/21/2020   HGB 14.6 08/21/2020   HCT 44.7 08/21/2020   MCV 86.3 08/21/2020   PLT 881 (H) 08/21/2020   Recent Labs    08/07/20 1001  NA 142  K 5.3  CL 105  CO2 29  GLUCOSE 88  BUN 15  CREATININE 0.90  CALCIUM 9.2  GFRNONAA 63  GFRAA 74  PROT 6.7  AST 16  ALT 10  BILITOT 0.7   Iron/TIBC/Ferritin/ %Sat  Component Value Date/Time   IRON 85 08/15/2020 1003   IRON 60 05/29/2015 1404   TIBC 337 08/15/2020 1003   TIBC 237 (L) 05/29/2015 1404   FERRITIN 31 08/15/2020 1003   FERRITIN 136 05/29/2015 1404   IRONPCTSAT 25 08/15/2020 1003   IRONPCTSAT 24 12/11/2015 1436      RADIOGRAPHIC STUDIES: I have personally reviewed the radiological images as listed and agreed with the findings in the report. CT BONE MARROW BIOPSY & ASPIRATION  Result Date: 08/21/2020 INDICATION: Thrombocytopenia of uncertain etiology. Please perform CT-guided bone marrow biopsy for tissue diagnostic purposes. EXAM: CT-GUIDED BONE MARROW BIOPSY AND ASPIRATION MEDICATIONS: None ANESTHESIA/SEDATION: Fentanyl 50 mcg IV; Versed 1 mg IV Sedation Time: 10 Minutes; The patient was continuously monitored during the procedure by the interventional radiology nurse under my direct supervision. COMPLICATIONS: None immediate. PROCEDURE: Informed consent was obtained from the patient following an explanation of the procedure, risks, benefits and alternatives. The patient understands, agrees and consents for the procedure. All questions were addressed. A time out was performed prior to the initiation of the procedure. The patient was positioned prone and non-contrast localization CT was performed of the pelvis to demonstrate the iliac marrow spaces. The operative site was prepped and draped in  the usual sterile fashion. Under sterile conditions and local anesthesia, a 22 gauge spinal needle was utilized for procedural planning. Next, an 11 gauge coaxial bone biopsy needle was advanced into the left iliac marrow space. Needle position was confirmed with CT imaging. Initially, a bone marrow aspiration was performed. Next, a bone marrow biopsy was obtained with the 11 gauge outer bone marrow device. The needle was removed and superficial hemostasis was obtained with manual compression. A dressing was applied. The patient tolerated the procedure well without immediate post procedural complication. IMPRESSION: Successful CT guided left iliac bone marrow aspiration and core biopsy. Electronically Signed   By: Sandi Mariscal M.D.   On: 08/21/2020 10:34      ASSESSMENT & PLAN:  1. Essential thrombocythemia ([place])   2. Bloating    #Myeloproliferative disorder, likely essential thrombocythemia Jak 2 V61F mutation positive 08/21/2020 bone marrow biopsy results showed hypercellular bone marrow for age with features of myeloproliferative neoplasm.  Consistent with essential thrombocythemia, iron deficient polycythemia vera, pretty fibrotic/early primary myelofibrosis. Patient has no erythrocytosis.  No chronic leukocytosis.  Clinically most consistent with essential thrombocythemia. Continue aspirin 81 mg. Recommend patient to start hydroxyurea for cytoreductive therapy Rationale and potential side effects were discussed.  She agrees. I sent patient a prescription of hydroxyurea 500 mg daily.  We will continue to titrate according to response and side effects.   Chronic bloating and abdominal discomfort, possible due to chronic IBS symptoms.  Last colonoscopy 2013. Patient was seen by Dr. Vira Agar in 2019 and was recommended to increase soluble fiber, MiraLAX, if not helpful trial of FODMAP diet and probiotics.  Recommend surveillance colonoscopy.    Orders Placed This Encounter  Procedures  . CBC with  Differential/Platelet    Standing Status:   Future    Standing Expiration Date:   08/28/2021  . Comprehensive metabolic panel    Standing Status:   Future    Standing Expiration Date:   08/28/2021    All questions were answered. The patient knows to call the clinic with any problems questions or concerns.  Cc Malfi, Lupita Raider, FNP  Return of visit: 2 weeks Earlie Server, MD, PhD 08/28/2020

## 2020-08-28 NOTE — Progress Notes (Signed)
Patient denies new problems/concerns today.   °

## 2020-08-29 ENCOUNTER — Other Ambulatory Visit: Payer: Self-pay | Admitting: Family Medicine

## 2020-08-29 DIAGNOSIS — K580 Irritable bowel syndrome with diarrhea: Secondary | ICD-10-CM

## 2020-08-29 DIAGNOSIS — F419 Anxiety disorder, unspecified: Secondary | ICD-10-CM

## 2020-08-29 NOTE — Telephone Encounter (Signed)
   Notes to clinic:  requesting a 90 day supply with 2 refills   Requested Prescriptions  Pending Prescriptions Disp Refills   escitalopram (LEXAPRO) 10 MG tablet [Pharmacy Med Name: ESCITALOPRAM 10 MG TABLET] 90 tablet 2    Sig: TAKE 1 TABLET BY MOUTH EVERY DAY      Psychiatry:  Antidepressants - SSRI Passed - 08/29/2020  8:32 AM      Passed - Valid encounter within last 6 months    Recent Outpatient Visits           3 weeks ago Irritable bowel syndrome with diarrhea   [place], DO   5 months ago Primary hypertension   [place], FNP   1 year ago Essential hypertension   [place], [place]   2 years ago Essential hypertension   [facility] [employer], NP   2 years ago Generalized abdominal pain   [facility] Merrilyn Puma, Jerrel Ivory, NP       Future Appointments             In 3 weeks Garnette Gunner, Coralie Keens, NP [facility], [facility]

## 2020-08-30 ENCOUNTER — Encounter (HOSPITAL_COMMUNITY): Payer: Self-pay | Admitting: Oncology

## 2020-09-11 ENCOUNTER — Other Ambulatory Visit: Payer: Self-pay

## 2020-09-11 ENCOUNTER — Encounter: Payer: Self-pay | Admitting: Oncology

## 2020-09-11 ENCOUNTER — Inpatient Hospital Stay: Payer: Medicare HMO

## 2020-09-11 ENCOUNTER — Inpatient Hospital Stay (HOSPITAL_BASED_OUTPATIENT_CLINIC_OR_DEPARTMENT_OTHER): Payer: Medicare HMO | Admitting: Oncology

## 2020-09-11 VITALS — BP 130/80 | HR 71 | Temp 97.5°F | Resp 16 | Wt 139.5 lb

## 2020-09-11 DIAGNOSIS — Z5111 Encounter for antineoplastic chemotherapy: Secondary | ICD-10-CM

## 2020-09-11 DIAGNOSIS — Z7982 Long term (current) use of aspirin: Secondary | ICD-10-CM | POA: Diagnosis not present

## 2020-09-11 DIAGNOSIS — D75839 Thrombocytosis, unspecified: Secondary | ICD-10-CM | POA: Diagnosis not present

## 2020-09-11 DIAGNOSIS — D473 Essential (hemorrhagic) thrombocythemia: Secondary | ICD-10-CM

## 2020-09-11 DIAGNOSIS — Z79899 Other long term (current) drug therapy: Secondary | ICD-10-CM | POA: Diagnosis not present

## 2020-09-11 LAB — CBC WITH DIFFERENTIAL/PLATELET
Abs Immature Granulocytes: 0.02 10*3/uL (ref 0.00–0.07)
Basophils Absolute: 0.1 10*3/uL (ref 0.0–0.1)
Basophils Relative: 2 %
Eosinophils Absolute: 0.2 10*3/uL (ref 0.0–0.5)
Eosinophils Relative: 3 %
HCT: 43.7 % (ref 36.0–46.0)
Hemoglobin: 14.3 g/dL (ref 12.0–15.0)
Immature Granulocytes: 0 %
Lymphocytes Relative: 35 %
Lymphs Abs: 2.6 10*3/uL (ref 0.7–4.0)
MCH: 28.7 pg (ref 26.0–34.0)
MCHC: 32.7 g/dL (ref 30.0–36.0)
MCV: 87.8 fL (ref 80.0–100.0)
Monocytes Absolute: 0.6 10*3/uL (ref 0.1–1.0)
Monocytes Relative: 8 %
Neutro Abs: 3.9 10*3/uL (ref 1.7–7.7)
Neutrophils Relative %: 52 %
Platelets: 684 10*3/uL — ABNORMAL HIGH (ref 150–400)
RBC: 4.98 MIL/uL (ref 3.87–5.11)
RDW: 15.9 % — ABNORMAL HIGH (ref 11.5–15.5)
WBC: 7.4 10*3/uL (ref 4.0–10.5)
nRBC: 0 % (ref 0.0–0.2)

## 2020-09-11 LAB — COMPREHENSIVE METABOLIC PANEL
ALT: 15 U/L (ref 0–44)
AST: 20 U/L (ref 15–41)
Albumin: 3.7 g/dL (ref 3.5–5.0)
Alkaline Phosphatase: 119 U/L (ref 38–126)
Anion gap: 9 (ref 5–15)
BUN: 14 mg/dL (ref 8–23)
CO2: 26 mmol/L (ref 22–32)
Calcium: 8.7 mg/dL — ABNORMAL LOW (ref 8.9–10.3)
Chloride: 102 mmol/L (ref 98–111)
Creatinine, Ser: 0.95 mg/dL (ref 0.44–1.00)
GFR, Estimated: >60 mL/min (ref >60)
Glucose, Bld: 63 mg/dL — ABNORMAL LOW (ref 70–99)
Potassium: 4.5 mmol/L (ref 3.5–5.1)
Sodium: 137 mmol/L (ref 135–145)
Total Bilirubin: 0.8 mg/dL (ref 0.3–1.2)
Total Protein: 6.6 g/dL (ref 6.5–8.1)

## 2020-09-11 NOTE — Progress Notes (Signed)
Hematology/Oncology Consult note [facility] [phone] [phone]   Patient Care Team: Malfi, Lupita Raider, FNP as PCP - General (Family Medicine)  REFERRING PROVIDER: Lorine Bears Lupita Raider, FNP  CHIEF COMPLAINTS/REASON FOR VISIT:  Evaluation of thrombocytosis  HISTORY OF PRESENTING ILLNESS:  Tiffany Richardson is a 75 y.o. female who was seen in consultation at the request of Malfi, Lupita Raider, FNP for evaluation of thromcbocytosis Reviewed patient's labs.  08/07/2020 labs showed elevated platelet counts at 904,000.  Normal wbc  and hemoglobin   Reviewed patient's previous labs. Thrombocytosis onset is chronic onset , duration since at least 2017 No aggravating or elevated factors. Associated symptoms or signs:  Denies weight loss, fever, chills, fatigue, night sweats.  Context:  Smoking history: Denies Family history of polycythemia.  Denies.  However his father had a history of needing frequent blood removal History of iron deficiency anemia: Denies History of DVT: Denies She also reports chronic abdominal bloating.  She has IBS.  Denies any unintentional weight loss, fever, nausea vomiting.  No recent colonoscopy in current EMR. She is widowed, husband had colon cancer.  # Jak 2 V61F mutation positive 08/21/2020 bone marrow biopsy results showed hypercellular bone marrow for age with features of myeloproliferative neoplasm.  Consistent with essential thrombocythemia, iron deficient polycythemia vera, pretty fibrotic/early primary myelofibrosis. Patient has no erythrocytosis.  No chronic leukocytosis.  Clinically most consistent with essential thrombocythemia.   # Chronic bloating and abdominal discomfort, possible due to chronic IBS symptoms.  Last colonoscopy 2013. Patient was seen by Dr. Vira Agar in 2019 and was recommended to increase soluble fiber, MiraLAX, if not helpful trial of FODMAP diet and probiotics.  Discussed with patient that I recommend  surveillance colonoscopy.   INTERVAL HISTORY Tiffany Richardson is a 75 y.o. female who has above history reviewed by me today presents for follow up visit for management of essential thrombocythemia Problems and complaints are listed below: Patient has been on hydroxyurea 500 mg daily.  She tolerates well.  Denies any nausea vomiting diarrhea. No new concerns. Review of Systems  Constitutional: Negative for appetite change, chills, fatigue and fever.  HENT:   Negative for hearing loss and voice change.   Eyes: Negative for eye problems.  Respiratory: Negative for chest tightness and cough.   Cardiovascular: Negative for chest pain.  Gastrointestinal: Negative for abdominal distention, abdominal pain and blood in stool.  Endocrine: Negative for hot flashes.  Genitourinary: Negative for difficulty urinating and frequency.   Musculoskeletal: Negative for arthralgias.  Skin: Negative for itching and rash.  Neurological: Negative for extremity weakness.  Hematological: Negative for adenopathy.  Psychiatric/Behavioral: Negative for confusion.    MEDICAL HISTORY:  Past Medical History:  Diagnosis Date  . Allergy   . Cancer ([place])    basal cell  . Emphysema of lung ([place])   . Hypertension     SURGICAL HISTORY: Past Surgical History:  Procedure Laterality Date  . basal cell removed     Rt eye  . squamous cell removed       SOCIAL HISTORY: Social History   Socioeconomic History  . Marital status: Widowed    Spouse name: Not on file  . Number of children: Not on file  . Years of education: Not on file  . Highest education level: Not on file  Occupational History  . Not on file  Tobacco Use  . Smoking status: Never Smoker  . Smokeless tobacco: Never Used  Vaping Use  . Vaping Use:  Never used  Substance and Sexual Activity  . Alcohol use: No    Alcohol/week: 0.0 standard drinks  . Drug use: No  . Sexual activity: Not on file  Other Topics Concern  . Not on file  Social  History Narrative  . Not on file   Social Determinants of Health   Financial Resource Strain: Not on file  Food Insecurity: Not on file  Transportation Needs: Not on file  Physical Activity: Not on file  Stress: Not on file  Social Connections: Not on file  Intimate Partner Violence: Not on file    FAMILY HISTORY: Family History  Problem Relation Age of Onset  . Leukemia Mother   . Heart disease Father   . Non-Hodgkin's lymphoma Daughter     ALLERGIES:  is allergic to codeine.  MEDICATIONS:  Current Outpatient Medications  Medication Sig Dispense Refill  . aspirin EC 81 MG tablet Take 81 mg by mouth.    Marland Kitchen atorvastatin (LIPITOR) 20 MG tablet Take 1 tablet (20 mg total) by mouth daily. 90 tablet 1  . escitalopram (LEXAPRO) 10 MG tablet TAKE 1 TABLET BY MOUTH EVERY DAY 90 tablet 2  . fluticasone (FLONASE) 50 MCG/ACT nasal spray SPRAY 2 SPRAYS INTO EACH NOSTRIL EVERY DAY 48 mL 1  . hydroxyurea (HYDREA) 500 MG capsule Take 1 capsule (500 mg total) by mouth daily. May take with food to minimize GI side effects. 30 capsule 0  . metoprolol succinate (TOPROL-XL) 50 MG 24 hr tablet TAKE 1 TABLET (50 MG TOTAL) BY MOUTH DAILY. TAKE WITH OR IMMEDIATELY FOLLOWING A MEAL. 90 tablet 1  . omeprazole (PRILOSEC) 40 MG capsule Take 40 mg by mouth daily.     No current facility-administered medications for this visit.     PHYSICAL EXAMINATION: ECOG PERFORMANCE STATUS: 0 - Asymptomatic There were no vitals filed for this visit. There were no vitals filed for this visit.  Physical Exam Constitutional:      General: She is not in acute distress. HENT:     Head: Normocephalic and atraumatic.  Eyes:     General: No scleral icterus. Cardiovascular:     Rate and Rhythm: Normal rate and regular rhythm.     Heart sounds: Normal heart sounds.  Pulmonary:     Effort: Pulmonary effort is normal. No respiratory distress.     Breath sounds: No wheezing.  Abdominal:     General: Bowel sounds  are normal. There is no distension.     Palpations: Abdomen is soft.  Musculoskeletal:        General: No deformity. Normal range of motion.     Cervical back: Normal range of motion and neck supple.  Skin:    General: Skin is warm and dry.     Findings: No erythema or rash.  Neurological:     Mental Status: She is alert and oriented to person, place, and time. Mental status is at baseline.     Cranial Nerves: No cranial nerve deficit.     Coordination: Coordination normal.  Psychiatric:        Mood and Affect: Mood normal.      LABORATORY DATA:  I have reviewed the data as listed Lab Results  Component Value Date   WBC 11.2 (H) 08/21/2020   HGB 14.6 08/21/2020   HCT 44.7 08/21/2020   MCV 86.3 08/21/2020   PLT 881 (H) 08/21/2020   Recent Labs    08/07/20 1001  NA 142  K 5.3  CL 105  CO2 29  GLUCOSE 88  BUN 15  CREATININE 0.90  CALCIUM 9.2  GFRNONAA 63  GFRAA 74  PROT 6.7  AST 16  ALT 10  BILITOT 0.7   Iron/TIBC/Ferritin/ %Sat    Component Value Date/Time   IRON 85 08/15/2020 1003   IRON 60 05/29/2015 1404   TIBC 337 08/15/2020 1003   TIBC 237 (L) 05/29/2015 1404   FERRITIN 31 08/15/2020 1003   FERRITIN 136 05/29/2015 1404   IRONPCTSAT 25 08/15/2020 1003   IRONPCTSAT 24 12/11/2015 1436      RADIOGRAPHIC STUDIES: I have personally reviewed the radiological images as listed and agreed with the findings in the report. CT BONE MARROW BIOPSY & ASPIRATION  Result Date: 08/21/2020 INDICATION: Thrombocytopenia of uncertain etiology. Please perform CT-guided bone marrow biopsy for tissue diagnostic purposes. EXAM: CT-GUIDED BONE MARROW BIOPSY AND ASPIRATION MEDICATIONS: None ANESTHESIA/SEDATION: Fentanyl 50 mcg IV; Versed 1 mg IV Sedation Time: 10 Minutes; The patient was continuously monitored during the procedure by the interventional radiology nurse under my direct supervision. COMPLICATIONS: None immediate. PROCEDURE: Informed consent was obtained from the  patient following an explanation of the procedure, risks, benefits and alternatives. The patient understands, agrees and consents for the procedure. All questions were addressed. A time out was performed prior to the initiation of the procedure. The patient was positioned prone and non-contrast localization CT was performed of the pelvis to demonstrate the iliac marrow spaces. The operative site was prepped and draped in the usual sterile fashion. Under sterile conditions and local anesthesia, a 22 gauge spinal needle was utilized for procedural planning. Next, an 11 gauge coaxial bone biopsy needle was advanced into the left iliac marrow space. Needle position was confirmed with CT imaging. Initially, a bone marrow aspiration was performed. Next, a bone marrow biopsy was obtained with the 11 gauge outer bone marrow device. The needle was removed and superficial hemostasis was obtained with manual compression. A dressing was applied. The patient tolerated the procedure well without immediate post procedural complication. IMPRESSION: Successful CT guided left iliac bone marrow aspiration and core biopsy. Electronically Signed   By: Sandi Mariscal M.D.   On: 08/21/2020 10:34      ASSESSMENT & PLAN:  1. Essential thrombocythemia ([place])   2. Encounter for antineoplastic chemotherapy    # essential thrombocythemia Continue aspirin 81 mg. Labs reviewed and discussed with patient Platelet count has responded nicely to the hydroxyurea.  Currently at 684. Continue hydroxyurea at 500 mg daily. Repeat blood work and follow-up in 4 weeks for titration of hydroxyurea dosage.   All questions were answered. The patient knows to call the clinic with any problems questions or concerns.  Cc Malfi, Lupita Raider, FNP  Return of visit: 4 weeks Earlie Server, MD, PhD 09/11/2020

## 2020-09-11 NOTE — Progress Notes (Signed)
Patient denies new problems/concerns today.   °

## 2020-09-13 DIAGNOSIS — D0439 Carcinoma in situ of skin of other parts of face: Secondary | ICD-10-CM | POA: Diagnosis not present

## 2020-09-13 DIAGNOSIS — L820 Inflamed seborrheic keratosis: Secondary | ICD-10-CM | POA: Diagnosis not present

## 2020-09-13 DIAGNOSIS — Z7189 Other specified counseling: Secondary | ICD-10-CM | POA: Diagnosis not present

## 2020-09-13 DIAGNOSIS — D2272 Melanocytic nevi of left lower limb, including hip: Secondary | ICD-10-CM | POA: Diagnosis not present

## 2020-09-13 DIAGNOSIS — D0461 Carcinoma in situ of skin of right upper limb, including shoulder: Secondary | ICD-10-CM | POA: Diagnosis not present

## 2020-09-13 DIAGNOSIS — L821 Other seborrheic keratosis: Secondary | ICD-10-CM | POA: Diagnosis not present

## 2020-09-13 DIAGNOSIS — D485 Neoplasm of uncertain behavior of skin: Secondary | ICD-10-CM | POA: Diagnosis not present

## 2020-09-13 DIAGNOSIS — X32XXXA Exposure to sunlight, initial encounter: Secondary | ICD-10-CM | POA: Diagnosis not present

## 2020-09-13 DIAGNOSIS — D2271 Melanocytic nevi of right lower limb, including hip: Secondary | ICD-10-CM | POA: Diagnosis not present

## 2020-09-13 DIAGNOSIS — L570 Actinic keratosis: Secondary | ICD-10-CM | POA: Diagnosis not present

## 2020-09-13 DIAGNOSIS — C44519 Basal cell carcinoma of skin of other part of trunk: Secondary | ICD-10-CM | POA: Diagnosis not present

## 2020-09-19 ENCOUNTER — Other Ambulatory Visit: Payer: Self-pay | Admitting: Oncology

## 2020-09-21 LAB — SURGICAL PATHOLOGY

## 2020-09-22 ENCOUNTER — Other Ambulatory Visit: Payer: Self-pay

## 2020-09-22 DIAGNOSIS — I10 Essential (primary) hypertension: Secondary | ICD-10-CM

## 2020-09-22 MED ORDER — METOPROLOL SUCCINATE ER 50 MG PO TB24: 50.0000 mg | ORAL_TABLET | Freq: Every day | ORAL | 0 refills | Status: DC

## 2020-09-25 ENCOUNTER — Ambulatory Visit (INDEPENDENT_AMBULATORY_CARE_PROVIDER_SITE_OTHER): Payer: Medicare HMO | Admitting: Internal Medicine

## 2020-09-25 ENCOUNTER — Encounter: Payer: Self-pay | Admitting: Internal Medicine

## 2020-09-25 ENCOUNTER — Other Ambulatory Visit: Payer: Self-pay

## 2020-09-25 DIAGNOSIS — I10 Essential (primary) hypertension: Secondary | ICD-10-CM

## 2020-09-25 DIAGNOSIS — K219 Gastro-esophageal reflux disease without esophagitis: Secondary | ICD-10-CM

## 2020-09-25 DIAGNOSIS — E782 Mixed hyperlipidemia: Secondary | ICD-10-CM

## 2020-09-25 DIAGNOSIS — D473 Essential (hemorrhagic) thrombocythemia: Secondary | ICD-10-CM | POA: Diagnosis not present

## 2020-09-25 DIAGNOSIS — K582 Mixed irritable bowel syndrome: Secondary | ICD-10-CM

## 2020-09-25 MED ORDER — PANTOPRAZOLE SODIUM 40 MG PO TBEC: 40.0000 mg | DELAYED_RELEASE_TABLET | Freq: Every day | ORAL | 0 refills | Status: DC

## 2020-09-25 NOTE — Assessment & Plan Note (Signed)
Encouraged her to consume a low fat diet Continue Atorvastatin 

## 2020-09-25 NOTE — Assessment & Plan Note (Signed)
Continue Hydroxyurea prescribed by hematology Will follow

## 2020-09-25 NOTE — Assessment & Plan Note (Signed)
Discussed stress reducing techniques Rx for Pantoprazole 40 mg daily sent to pharmacy

## 2020-09-25 NOTE — Assessment & Plan Note (Signed)
Controlled with diet and peppermint oil Has failed multiple prescription medications Declines referral to GI at this time

## 2020-09-25 NOTE — Assessment & Plan Note (Signed)
Controlled on Metoprolol Reinforced DASH diet and exercise for weight loss Will monitor

## 2020-09-25 NOTE — Patient Instructions (Signed)
Diet for Irritable Bowel Syndrome When you have irritable bowel syndrome (IBS), it is very important to eat the foods and follow the eating habits that are best for your condition. IBS may cause various symptoms such as pain in the abdomen, constipation, or diarrhea. Choosing the right foods can help to ease the discomfort from these symptoms. Work with your health care provider and diet and nutrition specialist (dietitian) to find the eating plan that will help to control your symptoms. What are tips for following this plan?  Keep a food diary. This will help you identify foods that cause symptoms. Write down: ? What you eat and when you eat it. ? What symptoms you have. ? When symptoms occur in relation to your meals, such as "pain in abdomen 2 hours after dinner."  Eat your meals slowly and in a relaxed setting.  Aim to eat 5-6 small meals per day. Do not skip meals.  Drink enough fluid to keep your urine pale yellow.  Ask your health care provider if you should take an over-the-counter probiotic to help restore healthy bacteria in your gut (digestive tract). ? Probiotics are foods that contain good bacteria and yeasts.  Your dietitian may have specific dietary recommendations for you based on your symptoms. He or she may recommend that you: ? Avoid foods that cause symptoms. Talk with your dietitian about other ways to get the same nutrients that are in those problem foods. ? Avoid foods with gluten. Gluten is a protein that is found in rye, wheat, and barley. ? Eat more foods that contain soluble fiber. Examples of foods with high soluble fiber include oats, seeds, and certain fruits and vegetables. Take a fiber supplement if directed by your dietitian. ? Reduce or avoid certain foods called FODMAPs. These are foods that contain carbohydrates that are hard to digest. Ask your doctor which foods contain these carbohydrates.      What foods are not recommended? The following are some  foods and drinks that may make your symptoms worse:  Fatty foods, such as french fries.  Foods that contain gluten, such as pasta and cereal.  Dairy products, such as milk, cheese, and ice cream.  Chocolate.  Alcohol.  Products with caffeine, such as coffee.  Carbonated drinks, such as soda.  Foods that are high in FODMAPs. These include certain fruits and vegetables.  Products with sweeteners such as honey, high fructose corn syrup, sorbitol, and mannitol. The items listed above may not be a complete list of foods and beverages you should avoid. Contact a dietitian for more information.   What foods are good sources of fiber? Your health care provider or dietitian may recommend that you eat more foods that contain fiber. Fiber can help to reduce constipation and other IBS symptoms. Add foods with fiber to your diet a little at a time so your body can get used to them. Too much fiber at one time might cause gas and swelling of your abdomen. The following are some foods that are good sources of fiber:  Berries, such as raspberries, strawberries, and blueberries.  Tomatoes.  Carrots.  Brown rice.  Oats.  Seeds, such as chia and pumpkin seeds. The items listed above may not be a complete list of recommended sources of fiber. Contact your dietitian for more options. Where to find more information  International Foundation for Functional Gastrointestinal Disorders: www.iffgd.[employer] of Diabetes and Digestive and Kidney Diseases: [employer] Summary  When you have irritable bowel syndrome (  IBS), it is very important to eat the foods and follow the eating habits that are best for your condition.  IBS may cause various symptoms such as pain in the abdomen, constipation, or diarrhea.  Choosing the right foods can help to ease the discomfort that comes from symptoms.  Keep a food diary. This will help you identify foods that cause symptoms.  Your health  care provider or diet and nutrition specialist (dietitian) may recommend that you eat more foods that contain fiber. This information is not intended to replace advice given to you by your health care provider. Make sure you discuss any questions you have with your health care provider. Document Revised: 12/09/2019 Document Reviewed: 12/09/2019 Elsevier Patient Education  2021 [employer].

## 2020-09-25 NOTE — Progress Notes (Signed)
Subjective:    Patient ID: Tiffany Richardson, female    DOB: [birth year], 75 y.o.   MRN: [id number]  HPI  Patient presents to clinic today for follow-up of chronic conditions.  She is establishing care with me today, transferring care from [facility], NP.  HTN: Her BP today is 127/70.  She is taking Metoprolol as prescribed.  ECG from 04/2015 reviewed.  GERD: Triggered by stress.  She was recently switched to Pantoprazole which she is taking with better relief of her symptoms.  There is no upper GI on file.  IBS: She reports alternating constipation and diarrhea. Her biggest issues is bloating. Recently started on Escitalopram for symptom management but she stopped taking it because she did not like the way it made her feel.  She has failed Dicyclomine, Amitriptyline in the past.  Colonoscopy from 12/2011 reviewed.  Essential Thrombocytopenia: Recent platelet count 684.  She was recently evaluated by hematology for the same.  She was started on Hydroxyurea for management.  HLD: Her last LDL was 77, triglycerides 138, 06/2019.  She denies myalgias on Atorvastatin.  She tries to consume a low-fat diet.  Review of Systems      Past Medical History:  Diagnosis Date  . Allergy   . Cancer ([place])    basal cell  . Emphysema of lung ([place])   . Hypertension     Current Outpatient Medications  Medication Sig Dispense Refill  . aspirin EC 81 MG tablet Take 81 mg by mouth.    Marland Kitchen atorvastatin (LIPITOR) 20 MG tablet Take 1 tablet (20 mg total) by mouth daily. 90 tablet 1  . fluticasone (FLONASE) 50 MCG/ACT nasal spray SPRAY 2 SPRAYS INTO EACH NOSTRIL EVERY DAY 48 mL 1  . hydroxyurea (HYDREA) 500 MG capsule Take 1 capsule (500 mg total) by mouth daily. 30 capsule 0  . metoprolol succinate (TOPROL-XL) 50 MG 24 hr tablet Take 1 tablet (50 mg total) by mouth daily. Take with or immediately following a meal. 90 tablet 0  . omeprazole (PRILOSEC) 40 MG capsule Take 40 mg by mouth daily.     No current  facility-administered medications for this visit.    Allergies  Allergen Reactions  . Codeine Swelling    Family History  Problem Relation Age of Onset  . Leukemia Mother   . Heart disease Father   . Non-Hodgkin's lymphoma Daughter     Social History   Socioeconomic History  . Marital status: Widowed    Spouse name: Not on file  . Number of children: Not on file  . Years of education: Not on file  . Highest education level: Not on file  Occupational History  . Not on file  Tobacco Use  . Smoking status: Never Smoker  . Smokeless tobacco: Never Used  Vaping Use  . Vaping Use: Never used  Substance and Sexual Activity  . Alcohol use: No    Alcohol/week: 0.0 standard drinks  . Drug use: No  . Sexual activity: Not on file  Other Topics Concern  . Not on file  Social History Narrative  . Not on file   Social Determinants of Health   Financial Resource Strain: Not on file  Food Insecurity: Not on file  Transportation Needs: Not on file  Physical Activity: Not on file  Stress: Not on file  Social Connections: Not on file  Intimate Partner Violence: Not on file     Constitutional: Denies fever, malaise, fatigue, headache or abrupt weight changes.  HEENT: Denies eye pain, eye redness, ear pain, ringing in the ears, wax buildup, runny nose, nasal congestion, bloody nose, or sore throat. Respiratory: Denies difficulty breathing, shortness of breath, cough or sputum production.   Cardiovascular: Denies chest pain, chest tightness, palpitations or swelling in the hands or feet.  Gastrointestinal: Pt reports abdominal bloating, alternating constipation and diarrhea. Denies abdominal pain, diarrhea or blood in the stool.  GU: Denies urgency, frequency, pain with urination, burning sensation, blood in urine, odor or discharge. Musculoskeletal: Denies decrease in range of motion, difficulty with gait, muscle pain or joint pain and swelling.  Skin: Denies redness, rashes,  lesions or ulcercations.  Neurological: Denies dizziness, difficulty with memory, difficulty with speech or problems with balance and coordination.  Psych: Denies anxiety, depression, SI/HI.  No other specific complaints in a complete review of systems (except as listed in HPI above).  Objective:   Physical Exam  BP 127/70 (BP Location: Right Arm, Patient Position: Sitting, Cuff Size: Normal)   Pulse 68   Temp 97.7 F (36.5 C) (Temporal)   Resp 17   Ht 5\' 4"  (1.626 m)   Wt 141 lb (64 kg)   SpO2 99%   BMI 24.20 kg/m   Wt Readings from Last 3 Encounters:  09/11/20 139 lb 8 oz (63.3 kg)  08/28/20 141 lb 9.6 oz (64.2 kg)  08/21/20 141 lb (64 kg)    General: Appears her stated age, well developed, well nourished in NAD. Skin: Warm, dry and intact.  HEENT: Head: normal shape and size; Eyes: sclera white and EOMs intact;  Cardiovascular: Normal rate and rhythm. S1,S2 noted.  No murmur, rubs or gallops noted. No JVD or BLE edema. No carotid bruits noted. Pulmonary/Chest: Normal effort and positive vesicular breath sounds. No respiratory distress. No wheezes, rales or ronchi noted.  Abdomen: Soft and nontender. Normal bowel sounds. No distention or masses noted. Liver, spleen and kidneys non palpable. Musculoskeletal: No difficulty with gait.  Neurological: Alert and oriented. Psychiatric: Mood and affect normal. Behavior is normal. Judgment and thought content normal.     BMET    Component Value Date/Time   NA 137 09/11/2020 0840   NA 142 10/11/2016 0932   K 4.5 09/11/2020 0840   CL 102 09/11/2020 0840   CO2 26 09/11/2020 0840   GLUCOSE 63 (L) 09/11/2020 0840   BUN 14 09/11/2020 0840   BUN 12 10/11/2016 0932   CREATININE 0.95 09/11/2020 0840   CREATININE 0.90 08/07/2020 1001   CALCIUM 8.7 (L) 09/11/2020 0840   GFRNONAA >60 09/11/2020 0840   GFRNONAA 63 08/07/2020 1001   GFRAA 74 08/07/2020 1001    Lipid Panel     Component Value Date/Time   CHOL 134 07/05/2019 0836    CHOL 220 (H) 10/11/2016 0932   TRIG 138 07/05/2019 0836   HDL 35 (L) 07/05/2019 0836   HDL 42 10/11/2016 0932   CHOLHDL 3.8 07/05/2019 0836   LDLCALC 77 07/05/2019 0836    CBC    Component Value Date/Time   WBC 7.4 09/11/2020 0840   RBC 4.98 09/11/2020 0840   HGB 14.3 09/11/2020 0840   HGB 13.0 05/29/2015 1404   HCT 43.7 09/11/2020 0840   HCT 39.0 05/29/2015 1404   PLT 684 (H) 09/11/2020 0840   PLT 684 (H) 05/29/2015 1404   MCV 87.8 09/11/2020 0840   MCV 90 05/29/2015 1404   MCH 28.7 09/11/2020 0840   MCHC 32.7 09/11/2020 0840   RDW 15.9 (H) 09/11/2020 0840  RDW 13.6 05/29/2015 1404   LYMPHSABS 2.6 09/11/2020 0840   LYMPHSABS 3.5 (H) 05/29/2015 1404   MONOABS 0.6 09/11/2020 0840   EOSABS 0.2 09/11/2020 0840   EOSABS 0.3 05/29/2015 1404   BASOSABS 0.1 09/11/2020 0840   BASOSABS 0.1 05/29/2015 1404    Hgb A1C No results found for: HGBA1C        Assessment & Plan:   Webb Silversmith, NP This visit occurred during the SARS-CoV-2 public health emergency.  Safety protocols were in place, including screening questions prior to the visit, additional usage of staff PPE, and extensive cleaning of exam room while observing appropriate contact time as indicated for disinfecting solutions.

## 2020-10-09 ENCOUNTER — Other Ambulatory Visit: Payer: Self-pay

## 2020-10-09 ENCOUNTER — Inpatient Hospital Stay: Payer: Medicare HMO | Attending: Oncology

## 2020-10-09 ENCOUNTER — Inpatient Hospital Stay (HOSPITAL_BASED_OUTPATIENT_CLINIC_OR_DEPARTMENT_OTHER): Payer: Medicare HMO | Admitting: Oncology

## 2020-10-09 ENCOUNTER — Encounter: Payer: Self-pay | Admitting: Oncology

## 2020-10-09 VITALS — BP 138/87 | HR 68 | Temp 97.2°F | Resp 16 | Wt 140.8 lb

## 2020-10-09 DIAGNOSIS — D471 Chronic myeloproliferative disease: Secondary | ICD-10-CM | POA: Diagnosis not present

## 2020-10-09 DIAGNOSIS — Z5111 Encounter for antineoplastic chemotherapy: Secondary | ICD-10-CM

## 2020-10-09 DIAGNOSIS — D75839 Thrombocytosis, unspecified: Secondary | ICD-10-CM | POA: Diagnosis not present

## 2020-10-09 DIAGNOSIS — D473 Essential (hemorrhagic) thrombocythemia: Secondary | ICD-10-CM | POA: Diagnosis not present

## 2020-10-09 DIAGNOSIS — Z7982 Long term (current) use of aspirin: Secondary | ICD-10-CM | POA: Insufficient documentation

## 2020-10-09 DIAGNOSIS — Z79899 Other long term (current) drug therapy: Secondary | ICD-10-CM | POA: Diagnosis not present

## 2020-10-09 LAB — COMPREHENSIVE METABOLIC PANEL
ALT: 13 U/L (ref 0–44)
AST: 17 U/L (ref 15–41)
Albumin: 3.6 g/dL (ref 3.5–5.0)
Alkaline Phosphatase: 136 U/L — ABNORMAL HIGH (ref 38–126)
Anion gap: 8 (ref 5–15)
BUN: 16 mg/dL (ref 8–23)
CO2: 30 mmol/L (ref 22–32)
Calcium: 8.9 mg/dL (ref 8.9–10.3)
Chloride: 100 mmol/L (ref 98–111)
Creatinine, Ser: 0.91 mg/dL (ref 0.44–1.00)
GFR, Estimated: >60 mL/min (ref >60)
Glucose, Bld: 89 mg/dL (ref 70–99)
Potassium: 4.8 mmol/L (ref 3.5–5.1)
Sodium: 138 mmol/L (ref 135–145)
Total Bilirubin: 0.6 mg/dL (ref 0.3–1.2)
Total Protein: 6.9 g/dL (ref 6.5–8.1)

## 2020-10-09 LAB — CBC WITH DIFFERENTIAL/PLATELET
Abs Immature Granulocytes: 0.03 10*3/uL (ref 0.00–0.07)
Basophils Absolute: 0.1 10*3/uL (ref 0.0–0.1)
Basophils Relative: 2 %
Eosinophils Absolute: 0.2 10*3/uL (ref 0.0–0.5)
Eosinophils Relative: 3 %
HCT: 42.4 % (ref 36.0–46.0)
Hemoglobin: 14 g/dL (ref 12.0–15.0)
Immature Granulocytes: 0 %
Lymphocytes Relative: 30 %
Lymphs Abs: 2.3 10*3/uL (ref 0.7–4.0)
MCH: 30.2 pg (ref 26.0–34.0)
MCHC: 33 g/dL (ref 30.0–36.0)
MCV: 91.4 fL (ref 80.0–100.0)
Monocytes Absolute: 0.5 10*3/uL (ref 0.1–1.0)
Monocytes Relative: 7 %
Neutro Abs: 4.6 10*3/uL (ref 1.7–7.7)
Neutrophils Relative %: 58 %
Platelets: 541 10*3/uL — ABNORMAL HIGH (ref 150–400)
RBC: 4.64 MIL/uL (ref 3.87–5.11)
RDW: 19.5 % — ABNORMAL HIGH (ref 11.5–15.5)
WBC: 7.9 10*3/uL (ref 4.0–10.5)
nRBC: 0 % (ref 0.0–0.2)

## 2020-10-09 MED ORDER — HYDROXYUREA 500 MG PO CAPS
500.0000 mg | ORAL_CAPSULE | Freq: Every day | ORAL | 1 refills | Status: DC
Start: 2020-10-09 — End: 2021-04-11

## 2020-10-09 NOTE — Progress Notes (Signed)
Hematology/Oncology progress note [facility] [phone] [phone]   Patient Care Team: Jearld Fenton, NP as PCP - General (Internal Medicine)  REFERRING PROVIDER: Verl Bangs, FNP  CHIEF COMPLAINTS/REASON FOR VISIT:  Essential thrombocythemia  HISTORY OF PRESENTING ILLNESS:  Tiffany Richardson is a 75 y.o. female who was seen in consultation at the request of Malfi, Lupita Raider, FNP for evaluation of thromcbocytosis Reviewed patient's labs.  08/07/2020 labs showed elevated platelet counts at 904,000.  Normal wbc  and hemoglobin   Reviewed patient's previous labs. Thrombocytosis onset is chronic onset , duration since at least 2017 No aggravating or elevated factors. Associated symptoms or signs:  Denies weight loss, fever, chills, fatigue, night sweats.  Context:  Smoking history: Denies Family history of polycythemia.  Denies.  However his father had a history of needing frequent blood removal History of iron deficiency anemia: Denies History of DVT: Denies She also reports chronic abdominal bloating.  She has IBS.  Denies any unintentional weight loss, fever, nausea vomiting.  No recent colonoscopy in current EMR. She is widowed, husband had colon cancer.  # Jak 2 V61F mutation positive 08/21/2020 bone marrow biopsy results showed hypercellular bone marrow for age with features of myeloproliferative neoplasm.  Consistent with essential thrombocythemia, iron deficient polycythemia vera, pretty fibrotic/early primary myelofibrosis. Patient has no erythrocytosis.  No chronic leukocytosis.  Clinically most consistent with essential thrombocythemia.  # Chronic bloating and abdominal discomfort, possible due to chronic IBS symptoms.  Last colonoscopy 2013. Patient was seen by Dr. Vira Agar in 2019 and was recommended to increase soluble fiber, MiraLAX, if not helpful trial of FODMAP diet and probiotics.  Discussed with patient that I recommend  surveillance colonoscopy.   INTERVAL HISTORY Tiffany Richardson is a 75 y.o. female who has above history reviewed by me today presents for follow up visit for management of essential thrombocythemia Problems and complaints are listed below: Patient has been on hydroxyurea 500 mg daily.  She reports eating well and tolerating the medication.  No new complaints.     Review of Systems  Constitutional:  Negative for appetite change, chills, fatigue and fever.  HENT:   Negative for hearing loss and voice change.   Eyes:  Negative for eye problems.  Respiratory:  Negative for chest tightness and cough.   Cardiovascular:  Negative for chest pain.  Gastrointestinal:  Negative for abdominal distention, abdominal pain and blood in stool.  Endocrine: Negative for hot flashes.  Genitourinary:  Negative for difficulty urinating and frequency.   Musculoskeletal:  Negative for arthralgias.  Skin:  Negative for itching and rash.  Neurological:  Negative for extremity weakness.  Hematological:  Negative for adenopathy.  Psychiatric/Behavioral:  Negative for confusion.    MEDICAL HISTORY:  Past Medical History:  Diagnosis Date   Allergy    Cancer ([place])    basal cell   Emphysema of lung ([place])    Hypertension     SURGICAL HISTORY: Past Surgical History:  Procedure Laterality Date   basal cell removed     Rt eye   squamous cell removed       SOCIAL HISTORY: Social History   Socioeconomic History   Marital status: Widowed    Spouse name: Not on file   Number of children: Not on file   Years of education: Not on file   Highest education level: Not on file  Occupational History   Not on file  Tobacco Use   Smoking status: Never  Smokeless tobacco: Never  Vaping Use   Vaping Use: Never used  Substance and Sexual Activity   Alcohol use: No    Alcohol/week: 0.0 standard drinks   Drug use: No   Sexual activity: Not on file  Other Topics Concern   Not on file  Social History  Narrative   Not on file   Social Determinants of Health   Financial Resource Strain: Not on file  Food Insecurity: Not on file  Transportation Needs: Not on file  Physical Activity: Not on file  Stress: Not on file  Social Connections: Not on file  Intimate Partner Violence: Not on file    FAMILY HISTORY: Family History  Problem Relation Age of Onset   Leukemia Mother    Heart disease Father    Non-Hodgkin's lymphoma Daughter     ALLERGIES:  is allergic to codeine.  MEDICATIONS:  Current Outpatient Medications  Medication Sig Dispense Refill   aspirin EC 81 MG tablet Take 81 mg by mouth.     atorvastatin (LIPITOR) 20 MG tablet Take 1 tablet (20 mg total) by mouth daily. 90 tablet 1   fluticasone (FLONASE) 50 MCG/ACT nasal spray SPRAY 2 SPRAYS INTO EACH NOSTRIL EVERY DAY 48 mL 1   metoprolol succinate (TOPROL-XL) 50 MG 24 hr tablet Take 1 tablet (50 mg total) by mouth daily. Take with or immediately following a meal. 90 tablet 0   pantoprazole (PROTONIX) 40 MG tablet Take 1 tablet (40 mg total) by mouth daily. 90 tablet 0   hydroxyurea (HYDREA) 500 MG capsule Take 1 capsule (500 mg total) by mouth daily. 90 capsule 1   No current facility-administered medications for this visit.     PHYSICAL EXAMINATION: ECOG PERFORMANCE STATUS: 0 - Asymptomatic Vitals:   10/09/20 0909  BP: 138/87  Pulse: 68  Resp: 16  Temp: (!) 97.2 F (36.2 C)   Filed Weights   10/09/20 0909  Weight: 140 lb 12.8 oz (63.9 kg)    Physical Exam Constitutional:      General: She is not in acute distress. HENT:     Head: Normocephalic and atraumatic.  Eyes:     General: No scleral icterus. Cardiovascular:     Rate and Rhythm: Normal rate and regular rhythm.     Heart sounds: Normal heart sounds.  Pulmonary:     Effort: Pulmonary effort is normal. No respiratory distress.     Breath sounds: No wheezing.  Abdominal:     General: Bowel sounds are normal. There is no distension.      Palpations: Abdomen is soft.  Musculoskeletal:        General: No deformity. Normal range of motion.     Cervical back: Normal range of motion and neck supple.  Skin:    General: Skin is warm and dry.     Findings: No erythema or rash.  Neurological:     Mental Status: She is alert and oriented to person, place, and time. Mental status is at baseline.     Cranial Nerves: No cranial nerve deficit.     Coordination: Coordination normal.  Psychiatric:        Mood and Affect: Mood normal.     LABORATORY DATA:  I have reviewed the data as listed Lab Results  Component Value Date   WBC 7.9 10/09/2020   HGB 14.0 10/09/2020   HCT 42.4 10/09/2020   MCV 91.4 10/09/2020   PLT 541 (H) 10/09/2020   Recent Labs    08/07/20 1001  09/11/20 0840 10/09/20 0853  NA 142 137 138  K 5.3 4.5 4.8  CL 105 102 100  CO2 '29 26 30  ' GLUCOSE 88 63* 89  BUN '15 14 16  ' CREATININE 0.90 0.95 0.91  CALCIUM 9.2 8.7* 8.9  GFRNONAA 63 >60 >60  GFRAA 74  --   --   PROT 6.7 6.6 6.9  ALBUMIN  --  3.7 3.6  AST '16 20 17  ' ALT '10 15 13  ' ALKPHOS  --  119 136*  BILITOT 0.7 0.8 0.6    Iron/TIBC/Ferritin/ %Sat    Component Value Date/Time   IRON 85 08/15/2020 1003   IRON 60 05/29/2015 1404   TIBC 337 08/15/2020 1003   TIBC 237 (L) 05/29/2015 1404   FERRITIN 31 08/15/2020 1003   FERRITIN 136 05/29/2015 1404   IRONPCTSAT 25 08/15/2020 1003   IRONPCTSAT 24 12/11/2015 1436      RADIOGRAPHIC STUDIES: I have personally reviewed the radiological images as listed and agreed with the findings in the report. No results found.     ASSESSMENT & PLAN:  1. Essential thrombocythemia ([place])   2. Encounter for antineoplastic chemotherapy    # essential thrombocythemia Labs are reviewed and discussed with patient Her platelet counts are responding to.  Today and a 564,000 Continue hydroxyurea 500 mg daily Continue aspirin 81 mg Repeat blood work and follow-up in 8 weeks for titration of hydroxyurea  dosage.   All questions were answered. The patient knows to call the clinic with any problems questions or concerns.  Cc Malfi, Lupita Raider, FNP  Return of visit: 8 weeks Earlie Server, MD, PhD 10/09/2020

## 2020-10-09 NOTE — Progress Notes (Signed)
Patient denies new problems/concerns today.   °

## 2020-10-18 ENCOUNTER — Other Ambulatory Visit: Payer: Self-pay

## 2020-10-18 DIAGNOSIS — E782 Mixed hyperlipidemia: Secondary | ICD-10-CM

## 2020-10-18 MED ORDER — ATORVASTATIN CALCIUM 20 MG PO TABS: 20.0000 mg | ORAL_TABLET | Freq: Every day | ORAL | 3 refills | Status: DC

## 2020-10-26 ENCOUNTER — Other Ambulatory Visit: Payer: Self-pay

## 2020-10-30 DIAGNOSIS — C44519 Basal cell carcinoma of skin of other part of trunk: Secondary | ICD-10-CM | POA: Diagnosis not present

## 2020-10-30 DIAGNOSIS — D0461 Carcinoma in situ of skin of right upper limb, including shoulder: Secondary | ICD-10-CM | POA: Diagnosis not present

## 2020-11-06 DIAGNOSIS — D0439 Carcinoma in situ of skin of other parts of face: Secondary | ICD-10-CM | POA: Diagnosis not present

## 2020-12-04 DIAGNOSIS — Z48817 Encounter for surgical aftercare following surgery on the skin and subcutaneous tissue: Secondary | ICD-10-CM | POA: Diagnosis not present

## 2020-12-05 ENCOUNTER — Inpatient Hospital Stay: Payer: Medicare HMO | Attending: Oncology

## 2020-12-05 ENCOUNTER — Inpatient Hospital Stay (HOSPITAL_BASED_OUTPATIENT_CLINIC_OR_DEPARTMENT_OTHER): Payer: Medicare HMO | Admitting: Oncology

## 2020-12-05 ENCOUNTER — Encounter: Payer: Self-pay | Admitting: Oncology

## 2020-12-05 VITALS — BP 153/75 | HR 69 | Temp 97.9°F | Resp 16 | Ht 64.0 in | Wt 143.0 lb

## 2020-12-05 DIAGNOSIS — D471 Chronic myeloproliferative disease: Secondary | ICD-10-CM | POA: Diagnosis not present

## 2020-12-05 DIAGNOSIS — Z5111 Encounter for antineoplastic chemotherapy: Secondary | ICD-10-CM

## 2020-12-05 DIAGNOSIS — D473 Essential (hemorrhagic) thrombocythemia: Secondary | ICD-10-CM

## 2020-12-05 DIAGNOSIS — Z79899 Other long term (current) drug therapy: Secondary | ICD-10-CM | POA: Diagnosis not present

## 2020-12-05 DIAGNOSIS — C946 Myelodysplastic disease, not classified: Secondary | ICD-10-CM | POA: Insufficient documentation

## 2020-12-05 LAB — COMPREHENSIVE METABOLIC PANEL
ALT: 12 U/L (ref 0–44)
AST: 20 U/L (ref 15–41)
Albumin: 3.8 g/dL (ref 3.5–5.0)
Alkaline Phosphatase: 132 U/L — ABNORMAL HIGH (ref 38–126)
Anion gap: 7 (ref 5–15)
BUN: 11 mg/dL (ref 8–23)
CO2: 30 mmol/L (ref 22–32)
Calcium: 8.9 mg/dL (ref 8.9–10.3)
Chloride: 103 mmol/L (ref 98–111)
Creatinine, Ser: 0.9 mg/dL (ref 0.44–1.00)
GFR, Estimated: >60 mL/min (ref >60)
Glucose, Bld: 92 mg/dL (ref 70–99)
Potassium: 4.3 mmol/L (ref 3.5–5.1)
Sodium: 140 mmol/L (ref 135–145)
Total Bilirubin: 0.7 mg/dL (ref 0.3–1.2)
Total Protein: 6.8 g/dL (ref 6.5–8.1)

## 2020-12-05 LAB — CBC WITH DIFFERENTIAL/PLATELET
Abs Immature Granulocytes: 0.02 10*3/uL (ref 0.00–0.07)
Basophils Absolute: 0.1 10*3/uL (ref 0.0–0.1)
Basophils Relative: 2 %
Eosinophils Absolute: 0.2 10*3/uL (ref 0.0–0.5)
Eosinophils Relative: 3 %
HCT: 41.1 % (ref 36.0–46.0)
Hemoglobin: 13.8 g/dL (ref 12.0–15.0)
Immature Granulocytes: 0 %
Lymphocytes Relative: 33 %
Lymphs Abs: 2.2 10*3/uL (ref 0.7–4.0)
MCH: 34.6 pg — ABNORMAL HIGH (ref 26.0–34.0)
MCHC: 33.6 g/dL (ref 30.0–36.0)
MCV: 103 fL — ABNORMAL HIGH (ref 80.0–100.0)
Monocytes Absolute: 0.5 10*3/uL (ref 0.1–1.0)
Monocytes Relative: 7 %
Neutro Abs: 3.7 10*3/uL (ref 1.7–7.7)
Neutrophils Relative %: 55 %
Platelets: 510 10*3/uL — ABNORMAL HIGH (ref 150–400)
RBC: 3.99 MIL/uL (ref 3.87–5.11)
RDW: 18.3 % — ABNORMAL HIGH (ref 11.5–15.5)
WBC: 6.7 10*3/uL (ref 4.0–10.5)
nRBC: 0 % (ref 0.0–0.2)

## 2020-12-05 NOTE — Progress Notes (Signed)
Hematology/Oncology progress note [facility] [phone] [phone]   Patient Care Team: Jearld Fenton, NP as PCP - General (Internal Medicine)  REFERRING PROVIDER: Jearld Fenton, NP  CHIEF COMPLAINTS/REASON FOR VISIT:  Essential thrombocythemia  HISTORY OF PRESENTING ILLNESS:  Tiffany Richardson is a 75 y.o. female who was seen in consultation at the request of Baity, Coralie Keens, NP for evaluation of thromcbocytosis Reviewed patient's labs.  08/07/2020 labs showed elevated platelet counts at 904,000.  Normal wbc  and hemoglobin   Reviewed patient's previous labs. Thrombocytosis onset is chronic onset , duration since at least 2017 No aggravating or elevated factors. Associated symptoms or signs:  Denies weight loss, fever, chills, fatigue, night sweats.  Context:  Smoking history: Denies Family history of polycythemia.  Denies.  However his father had a history of needing frequent blood removal History of iron deficiency anemia: Denies History of DVT: Denies She also reports chronic abdominal bloating.  She has IBS.  Denies any unintentional weight loss, fever, nausea vomiting.  No recent colonoscopy in current EMR. She is widowed, husband had colon cancer.  # Jak 2 V61F mutation positive 08/21/2020 bone marrow biopsy results showed hypercellular bone marrow for age with features of myeloproliferative neoplasm.  Consistent with essential thrombocythemia, iron deficient polycythemia vera, pretty fibrotic/early primary myelofibrosis. Patient has no erythrocytosis.  No chronic leukocytosis.  Clinically most consistent with essential thrombocythemia.  # Chronic bloating and abdominal discomfort, possible due to chronic IBS symptoms.  Last colonoscopy 2013. Patient was seen by Dr. Vira Agar in 2019 and was recommended to increase soluble fiber, MiraLAX, if not helpful trial of FODMAP diet and probiotics.  Discussed with patient that I recommend  surveillance colonoscopy.   INTERVAL HISTORY Tiffany Richardson is a 75 y.o. female who has above history reviewed by me today presents for follow up visit for management of essential thrombocythemia Problems and complaints are listed below: Patient has been on hydroxyurea 500 mg daily. Tolerates well.  No new complaints.    Review of Systems  Constitutional:  Negative for appetite change, chills, fatigue and fever.  HENT:   Negative for hearing loss and voice change.   Eyes:  Negative for eye problems.  Respiratory:  Negative for chest tightness and cough.   Cardiovascular:  Negative for chest pain.  Gastrointestinal:  Negative for abdominal distention, abdominal pain and blood in stool.  Endocrine: Negative for hot flashes.  Genitourinary:  Negative for difficulty urinating and frequency.   Musculoskeletal:  Negative for arthralgias.  Skin:  Negative for itching and rash.  Neurological:  Negative for extremity weakness.  Hematological:  Negative for adenopathy.  Psychiatric/Behavioral:  Negative for confusion.    MEDICAL HISTORY:  Past Medical History:  Diagnosis Date   Allergy    Cancer ([place])    basal cell   Emphysema of lung ([place])    Hypertension     SURGICAL HISTORY: Past Surgical History:  Procedure Laterality Date   basal cell removed     Rt eye   squamous cell removed       SOCIAL HISTORY: Social History   Socioeconomic History   Marital status: Widowed    Spouse name: Not on file   Number of children: Not on file   Years of education: Not on file   Highest education level: Not on file  Occupational History   Not on file  Tobacco Use   Smoking status: Never   Smokeless tobacco: Never  Vaping Use  Vaping Use: Never used  Substance and Sexual Activity   Alcohol use: No    Alcohol/week: 0.0 standard drinks   Drug use: No   Sexual activity: Not on file  Other Topics Concern   Not on file  Social History Narrative   Not on file   Social Determinants  of Health   Financial Resource Strain: Not on file  Food Insecurity: Not on file  Transportation Needs: Not on file  Physical Activity: Not on file  Stress: Not on file  Social Connections: Not on file  Intimate Partner Violence: Not on file    FAMILY HISTORY: Family History  Problem Relation Age of Onset   Leukemia Mother    Heart disease Father    Non-Hodgkin's lymphoma Daughter     ALLERGIES:  is allergic to codeine.  MEDICATIONS:  Current Outpatient Medications  Medication Sig Dispense Refill   aspirin EC 81 MG tablet Take 81 mg by mouth.     atorvastatin (LIPITOR) 20 MG tablet Take 1 tablet (20 mg total) by mouth daily. 90 tablet 3   fluticasone (FLONASE) 50 MCG/ACT nasal spray SPRAY 2 SPRAYS INTO EACH NOSTRIL EVERY DAY 48 mL 1   hydroxyurea (HYDREA) 500 MG capsule Take 1 capsule (500 mg total) by mouth daily. 90 capsule 1   metoprolol succinate (TOPROL-XL) 50 MG 24 hr tablet Take 1 tablet (50 mg total) by mouth daily. Take with or immediately following a meal. 90 tablet 0   pantoprazole (PROTONIX) 40 MG tablet Take 1 tablet (40 mg total) by mouth daily. 90 tablet 0   No current facility-administered medications for this visit.     PHYSICAL EXAMINATION: ECOG PERFORMANCE STATUS: 0 - Asymptomatic Vitals:   12/05/20 0939  BP: (!) 153/75  Pulse: 69  Resp: 16  Temp: 97.9 F (36.6 C)  SpO2: 100%   Filed Weights   12/05/20 0939  Weight: 143 lb (64.9 kg)    Physical Exam Constitutional:      General: She is not in acute distress. HENT:     Head: Normocephalic and atraumatic.  Eyes:     General: No scleral icterus. Cardiovascular:     Rate and Rhythm: Normal rate and regular rhythm.     Heart sounds: Normal heart sounds.  Pulmonary:     Effort: Pulmonary effort is normal. No respiratory distress.     Breath sounds: No wheezing.  Abdominal:     General: Bowel sounds are normal. There is no distension.     Palpations: Abdomen is soft.  Musculoskeletal:         General: No deformity. Normal range of motion.     Cervical back: Normal range of motion and neck supple.  Skin:    General: Skin is warm and dry.     Findings: No erythema or rash.  Neurological:     Mental Status: She is alert and oriented to person, place, and time. Mental status is at baseline.     Cranial Nerves: No cranial nerve deficit.     Coordination: Coordination normal.  Psychiatric:        Mood and Affect: Mood normal.     LABORATORY DATA:  I have reviewed the data as listed Lab Results  Component Value Date   WBC 6.7 12/05/2020   HGB 13.8 12/05/2020   HCT 41.1 12/05/2020   MCV 103.0 (H) 12/05/2020   PLT 510 (H) 12/05/2020   Recent Labs    08/07/20 1001 09/11/20 0840 10/09/20 8786 12/05/20 7672  NA 142 137 138 140  K 5.3 4.5 4.8 4.3  CL 105 102 100 103  CO2 '29 26 30 30  ' GLUCOSE 88 63* 89 92  BUN '15 14 16 11  ' CREATININE 0.90 0.95 0.91 0.90  CALCIUM 9.2 8.7* 8.9 8.9  GFRNONAA 63 >60 >60 >60  GFRAA 74  --   --   --   PROT 6.7 6.6 6.9 6.8  ALBUMIN  --  3.7 3.6 3.8  AST '16 20 17 20  ' ALT '10 15 13 12  ' ALKPHOS  --  119 136* 132*  BILITOT 0.7 0.8 0.6 0.7    Iron/TIBC/Ferritin/ %Sat    Component Value Date/Time   IRON 85 08/15/2020 1003   IRON 60 05/29/2015 1404   TIBC 337 08/15/2020 1003   TIBC 237 (L) 05/29/2015 1404   FERRITIN 31 08/15/2020 1003   FERRITIN 136 05/29/2015 1404   IRONPCTSAT 25 08/15/2020 1003   IRONPCTSAT 24 12/11/2015 1436      RADIOGRAPHIC STUDIES: I have personally reviewed the radiological images as listed and agreed with the findings in the report. No results found.     ASSESSMENT & PLAN:  1. Essential thrombocythemia ([place])   2. Encounter for antineoplastic chemotherapy   3. Myeloproliferative disease [facility])    # essential thrombocythemia Labs are reviewed and discussed with patient. Platelet count further decreased to 510,000.  Continue hydrea 574m daily. Continue Aspirin. Repeat blood work and follow-up in 8  weeks for titration of hydroxyurea dosage.   All questions were answered. The patient knows to call the clinic with any problems questions or concerns.  Cc BJearld Fenton NP  Return of visit: 8 weeks ZEarlie Server MD, PhD 12/05/2020

## 2020-12-12 ENCOUNTER — Other Ambulatory Visit: Payer: Self-pay | Admitting: Internal Medicine

## 2020-12-12 DIAGNOSIS — I10 Essential (primary) hypertension: Secondary | ICD-10-CM

## 2020-12-12 NOTE — Telephone Encounter (Signed)
Requested Prescriptions  Pending Prescriptions Disp Refills  . metoprolol succinate (TOPROL-XL) 50 MG 24 hr tablet [Pharmacy Med Name: METOPROLOL SUCC ER 50 MG TAB] 90 tablet 0    Sig: TAKE 1 TABLET BY MOUTH DAILY. TAKE WITH OR IMMEDIATELY FOLLOWING A MEAL.     Cardiovascular:  Beta Blockers Failed - 12/12/2020  8:17 PM      Failed - Last BP in normal range    BP Readings from Last 1 Encounters:  12/05/20 (!) 153/75         Passed - Last Heart Rate in normal range    Pulse Readings from Last 1 Encounters:  12/05/20 69         Passed - Valid encounter within last 6 months    Recent Outpatient Visits          2 months ago Primary hypertension   [place], Coralie Keens, NP   4 months ago Irritable bowel syndrome with diarrhea   [place], DO   8 months ago Primary hypertension   [facility], Lupita Raider, FNP   1 year ago Essential hypertension   [place], [place]   2 years ago Essential hypertension   [facility] [employer], NP      Future Appointments            In 3 months Baity, Coralie Keens, NP [facility], [facility]

## 2020-12-21 ENCOUNTER — Other Ambulatory Visit: Payer: Self-pay

## 2020-12-21 DIAGNOSIS — J3089 Other allergic rhinitis: Secondary | ICD-10-CM

## 2020-12-21 MED ORDER — FLUTICASONE PROPIONATE 50 MCG/ACT NA SUSP: NASAL | 1 refills | Status: DC

## 2021-02-05 ENCOUNTER — Inpatient Hospital Stay (HOSPITAL_BASED_OUTPATIENT_CLINIC_OR_DEPARTMENT_OTHER): Payer: Medicare HMO | Admitting: Oncology

## 2021-02-05 ENCOUNTER — Other Ambulatory Visit: Payer: Self-pay

## 2021-02-05 ENCOUNTER — Inpatient Hospital Stay: Payer: Medicare HMO | Attending: Oncology

## 2021-02-05 ENCOUNTER — Encounter: Payer: Self-pay | Admitting: Oncology

## 2021-02-05 VITALS — BP 138/76 | HR 72 | Temp 98.0°F | Resp 18 | Wt 140.5 lb

## 2021-02-05 DIAGNOSIS — D473 Essential (hemorrhagic) thrombocythemia: Secondary | ICD-10-CM | POA: Insufficient documentation

## 2021-02-05 DIAGNOSIS — Z5111 Encounter for antineoplastic chemotherapy: Secondary | ICD-10-CM

## 2021-02-05 LAB — CBC WITH DIFFERENTIAL/PLATELET
Abs Immature Granulocytes: 0.02 10*3/uL (ref 0.00–0.07)
Basophils Absolute: 0.1 10*3/uL (ref 0.0–0.1)
Basophils Relative: 1 %
Eosinophils Absolute: 0.1 10*3/uL (ref 0.0–0.5)
Eosinophils Relative: 2 %
HCT: 41 % (ref 36.0–46.0)
Hemoglobin: 13.9 g/dL (ref 12.0–15.0)
Immature Granulocytes: 0 %
Lymphocytes Relative: 34 %
Lymphs Abs: 2.5 10*3/uL (ref 0.7–4.0)
MCH: 36.8 pg — ABNORMAL HIGH (ref 26.0–34.0)
MCHC: 33.9 g/dL (ref 30.0–36.0)
MCV: 108.5 fL — ABNORMAL HIGH (ref 80.0–100.0)
Monocytes Absolute: 0.5 10*3/uL (ref 0.1–1.0)
Monocytes Relative: 7 %
Neutro Abs: 4.2 10*3/uL (ref 1.7–7.7)
Neutrophils Relative %: 56 %
Platelets: 447 10*3/uL — ABNORMAL HIGH (ref 150–400)
RBC: 3.78 MIL/uL — ABNORMAL LOW (ref 3.87–5.11)
RDW: 13.2 % (ref 11.5–15.5)
WBC: 7.4 10*3/uL (ref 4.0–10.5)
nRBC: 0 % (ref 0.0–0.2)

## 2021-02-05 LAB — COMPREHENSIVE METABOLIC PANEL
ALT: 15 U/L (ref 0–44)
AST: 17 U/L (ref 15–41)
Albumin: 3.7 g/dL (ref 3.5–5.0)
Alkaline Phosphatase: 132 U/L — ABNORMAL HIGH (ref 38–126)
Anion gap: 6 (ref 5–15)
BUN: 14 mg/dL (ref 8–23)
CO2: 28 mmol/L (ref 22–32)
Calcium: 8.7 mg/dL — ABNORMAL LOW (ref 8.9–10.3)
Chloride: 103 mmol/L (ref 98–111)
Creatinine, Ser: 0.84 mg/dL (ref 0.44–1.00)
GFR, Estimated: >60 mL/min (ref >60)
Glucose, Bld: 100 mg/dL — ABNORMAL HIGH (ref 70–99)
Potassium: 4.4 mmol/L (ref 3.5–5.1)
Sodium: 137 mmol/L (ref 135–145)
Total Bilirubin: 0.8 mg/dL (ref 0.3–1.2)
Total Protein: 7 g/dL (ref 6.5–8.1)

## 2021-02-05 NOTE — Progress Notes (Signed)
Hematology/Oncology progress note [facility] [phone] [phone]   Patient Care Team: Jearld Fenton, NP as PCP - General (Internal Medicine)  REFERRING PROVIDER: Jearld Fenton, NP  CHIEF COMPLAINTS/REASON FOR VISIT:  Essential thrombocythemia  HISTORY OF PRESENTING ILLNESS:  Tiffany Richardson is a 75 y.o. female who was seen in consultation at the request of Baity, Coralie Keens, NP for evaluation of thromcbocytosis Reviewed patient's labs.  08/07/2020 labs showed elevated platelet counts at 904,000.  Normal wbc  and hemoglobin   Reviewed patient's previous labs. Thrombocytosis onset is chronic onset , duration since at least 2017 No aggravating or elevated factors. Associated symptoms or signs:  Denies weight loss, fever, chills, fatigue, night sweats.  Context:  Smoking history: Denies Family history of polycythemia.  Denies.  However his father had a history of needing frequent blood removal History of iron deficiency anemia: Denies History of DVT: Denies She also reports chronic abdominal bloating.  She has IBS.  Denies any unintentional weight loss, fever, nausea vomiting.  No recent colonoscopy in current EMR. She is widowed, husband had colon cancer.  # Jak 2 V61F mutation positive 08/21/2020 bone marrow biopsy results showed hypercellular bone marrow for age with features of myeloproliferative neoplasm.  Consistent with essential thrombocythemia, iron deficient polycythemia vera, pretty fibrotic/early primary myelofibrosis. Patient has no erythrocytosis.  No chronic leukocytosis.  Clinically most consistent with essential thrombocythemia.  # Chronic bloating and abdominal discomfort, possible due to chronic IBS symptoms.  Last colonoscopy 2013. Patient was seen by Dr. Vira Agar in 2019 and was recommended to increase soluble fiber, MiraLAX, if not helpful trial of FODMAP diet and probiotics.  Discussed with patient that I recommend  surveillance colonoscopy.   INTERVAL HISTORY Tiffany Richardson is a 75 y.o. female who has above history reviewed by me today presents for follow up visit for management of essential thrombocythemia Patient has been on hydroxyurea 500 mg daily. She tolerates well No new complaints.    Review of Systems  Constitutional:  Negative for appetite change, chills, fatigue and fever.  HENT:   Negative for hearing loss and voice change.   Eyes:  Negative for eye problems.  Respiratory:  Negative for chest tightness and cough.   Cardiovascular:  Negative for chest pain.  Gastrointestinal:  Negative for abdominal distention, abdominal pain and blood in stool.  Endocrine: Negative for hot flashes.  Genitourinary:  Negative for difficulty urinating and frequency.   Musculoskeletal:  Negative for arthralgias.  Skin:  Negative for itching and rash.  Neurological:  Negative for extremity weakness.  Hematological:  Negative for adenopathy.  Psychiatric/Behavioral:  Negative for confusion.    MEDICAL HISTORY:  Past Medical History:  Diagnosis Date   Allergy    Cancer ([place])    basal cell   Emphysema of lung ([place])    Hypertension     SURGICAL HISTORY: Past Surgical History:  Procedure Laterality Date   basal cell removed     Rt eye   squamous cell removed       SOCIAL HISTORY: Social History   Socioeconomic History   Marital status: Widowed    Spouse name: Not on file   Number of children: Not on file   Years of education: Not on file   Highest education level: Not on file  Occupational History   Not on file  Tobacco Use   Smoking status: Never   Smokeless tobacco: Never  Vaping Use   Vaping Use: Never used  Substance and Sexual Activity   Alcohol use: No    Alcohol/week: 0.0 standard drinks   Drug use: No   Sexual activity: Not on file  Other Topics Concern   Not on file  Social History Narrative   Not on file   Social Determinants of Health   Financial Resource Strain:  Not on file  Food Insecurity: Not on file  Transportation Needs: Not on file  Physical Activity: Not on file  Stress: Not on file  Social Connections: Not on file  Intimate Partner Violence: Not on file    FAMILY HISTORY: Family History  Problem Relation Age of Onset   Leukemia Mother    Heart disease Father    Non-Hodgkin's lymphoma Daughter     ALLERGIES:  is allergic to codeine.  MEDICATIONS:  Current Outpatient Medications  Medication Sig Dispense Refill   aspirin EC 81 MG tablet Take 81 mg by mouth.     atorvastatin (LIPITOR) 20 MG tablet Take 1 tablet (20 mg total) by mouth daily. 90 tablet 3   fluticasone (FLONASE) 50 MCG/ACT nasal spray SPRAY 2 SPRAYS INTO EACH NOSTRIL EVERY DAY 48 mL 1   hydroxyurea (HYDREA) 500 MG capsule Take 1 capsule (500 mg total) by mouth daily. 90 capsule 1   metoprolol succinate (TOPROL-XL) 50 MG 24 hr tablet TAKE 1 TABLET BY MOUTH DAILY. TAKE WITH OR IMMEDIATELY FOLLOWING A MEAL. 90 tablet 0   pantoprazole (PROTONIX) 40 MG tablet Take 1 tablet (40 mg total) by mouth daily. 90 tablet 0   No current facility-administered medications for this visit.     PHYSICAL EXAMINATION: ECOG PERFORMANCE STATUS: 0 - Asymptomatic Vitals:   02/05/21 1348  BP: 138/76  Pulse: 72  Resp: 18  Temp: 98 F (36.7 C)   Filed Weights   02/05/21 1348  Weight: 140 lb 8 oz (63.7 kg)    Physical Exam Constitutional:      General: She is not in acute distress. HENT:     Head: Normocephalic and atraumatic.  Eyes:     General: No scleral icterus. Cardiovascular:     Rate and Rhythm: Normal rate and regular rhythm.     Heart sounds: Normal heart sounds.  Pulmonary:     Effort: Pulmonary effort is normal. No respiratory distress.     Breath sounds: No wheezing.  Abdominal:     General: Bowel sounds are normal. There is no distension.     Palpations: Abdomen is soft.  Musculoskeletal:        General: No deformity. Normal range of motion.     Cervical  back: Normal range of motion and neck supple.  Skin:    General: Skin is warm and dry.     Findings: No erythema or rash.  Neurological:     Mental Status: She is alert and oriented to person, place, and time. Mental status is at baseline.     Cranial Nerves: No cranial nerve deficit.     Coordination: Coordination normal.  Psychiatric:        Mood and Affect: Mood normal.     LABORATORY DATA:  I have reviewed the data as listed Lab Results  Component Value Date   WBC 7.4 02/05/2021   HGB 13.9 02/05/2021   HCT 41.0 02/05/2021   MCV 108.5 (H) 02/05/2021   PLT 447 (H) 02/05/2021   Recent Labs    08/07/20 1001 09/11/20 0840 10/09/20 0853 12/05/20 0927 02/05/21 1331  NA 142   < > 138  140 137  K 5.3   < > 4.8 4.3 4.4  CL 105   < > 100 103 103  CO2 29   < > '30 30 28  ' GLUCOSE 88   < > 89 92 100*  BUN 15   < > '16 11 14  ' CREATININE 0.90   < > 0.91 0.90 0.84  CALCIUM 9.2   < > 8.9 8.9 8.7*  GFRNONAA 63   < > >60 >60 >60  GFRAA 74  --   --   --   --   PROT 6.7   < > 6.9 6.8 7.0  ALBUMIN  --    < > 3.6 3.8 3.7  AST 16   < > '17 20 17  ' ALT 10   < > '13 12 15  ' ALKPHOS  --    < > 136* 132* 132*  BILITOT 0.7   < > 0.6 0.7 0.8   < > = values in this interval not displayed.    Iron/TIBC/Ferritin/ %Sat    Component Value Date/Time   IRON 85 08/15/2020 1003   IRON 60 05/29/2015 1404   TIBC 337 08/15/2020 1003   TIBC 237 (L) 05/29/2015 1404   FERRITIN 31 08/15/2020 1003   FERRITIN 136 05/29/2015 1404   IRONPCTSAT 25 08/15/2020 1003   IRONPCTSAT 24 12/11/2015 1436      RADIOGRAPHIC STUDIES: I have personally reviewed the radiological images as listed and agreed with the findings in the report. No results found.     ASSESSMENT & PLAN:  1. Essential thrombocythemia ([place])   2. Encounter for antineoplastic chemotherapy    # essential thrombocythemia Labs are reviewed and discussed with patient. Platelet count further decreased to 447,000.  Continue Hydrea 537m daily.  Continue Aspirin.  Repeat cbc in 6 weeks, follow up lab md cbc cmp in 12 weeks.    All questions were answered. The patient knows to call the clinic with any problems questions or concerns.  Cc BJearld Fenton NP  ZEarlie Server MD, PhD 02/05/2021

## 2021-02-06 ENCOUNTER — Inpatient Hospital Stay: Payer: Medicare HMO | Admitting: Oncology

## 2021-02-06 ENCOUNTER — Inpatient Hospital Stay: Payer: Medicare HMO

## 2021-03-09 ENCOUNTER — Other Ambulatory Visit: Payer: Self-pay | Admitting: Internal Medicine

## 2021-03-09 DIAGNOSIS — I10 Essential (primary) hypertension: Secondary | ICD-10-CM

## 2021-03-09 NOTE — Telephone Encounter (Signed)
Requested Prescriptions  Pending Prescriptions Disp Refills  . metoprolol succinate (TOPROL-XL) 50 MG 24 hr tablet [Pharmacy Med Name: METOPROLOL SUCC ER 50 MG TAB] 90 tablet 0    Sig: TAKE 1 TABLET BY MOUTH EVERY DAY WITH OR IMMEDIATELY FOLLOWING A MEAL     Cardiovascular:  Beta Blockers Passed - 03/09/2021  1:40 AM      Passed - Last BP in normal range    BP Readings from Last 1 Encounters:  02/05/21 138/76         Passed - Last Heart Rate in normal range    Pulse Readings from Last 1 Encounters:  02/05/21 72         Passed - Valid encounter within last 6 months    Recent Outpatient Visits          5 months ago Primary hypertension   [place], Coralie Keens, NP   7 months ago Irritable bowel syndrome with diarrhea   [place], DO   11 months ago Primary hypertension   [facility], Lupita Raider, FNP   1 year ago Essential hypertension   [place], [place]   2 years ago Essential hypertension   [facility] [employer], NP      Future Appointments            In 3 weeks Garnette Gunner, Coralie Keens, NP [facility]

## 2021-03-26 ENCOUNTER — Inpatient Hospital Stay: Payer: Medicare HMO | Attending: Oncology

## 2021-03-26 ENCOUNTER — Other Ambulatory Visit: Payer: Self-pay

## 2021-03-26 DIAGNOSIS — D473 Essential (hemorrhagic) thrombocythemia: Secondary | ICD-10-CM | POA: Diagnosis not present

## 2021-03-26 LAB — CBC WITH DIFFERENTIAL/PLATELET
Abs Immature Granulocytes: 0.02 10*3/uL (ref 0.00–0.07)
Basophils Absolute: 0.1 10*3/uL (ref 0.0–0.1)
Basophils Relative: 1 %
Eosinophils Absolute: 0.1 10*3/uL (ref 0.0–0.5)
Eosinophils Relative: 2 %
HCT: 44.2 % (ref 36.0–46.0)
Hemoglobin: 14.6 g/dL (ref 12.0–15.0)
Immature Granulocytes: 0 %
Lymphocytes Relative: 24 %
Lymphs Abs: 1.8 10*3/uL (ref 0.7–4.0)
MCH: 36 pg — ABNORMAL HIGH (ref 26.0–34.0)
MCHC: 33 g/dL (ref 30.0–36.0)
MCV: 109.1 fL — ABNORMAL HIGH (ref 80.0–100.0)
Monocytes Absolute: 0.5 10*3/uL (ref 0.1–1.0)
Monocytes Relative: 6 %
Neutro Abs: 5 10*3/uL (ref 1.7–7.7)
Neutrophils Relative %: 67 %
Platelets: 537 10*3/uL — ABNORMAL HIGH (ref 150–400)
RBC: 4.05 MIL/uL (ref 3.87–5.11)
RDW: 13.4 % (ref 11.5–15.5)
WBC: 7.5 10*3/uL (ref 4.0–10.5)
nRBC: 0 % (ref 0.0–0.2)

## 2021-04-02 ENCOUNTER — Ambulatory Visit (INDEPENDENT_AMBULATORY_CARE_PROVIDER_SITE_OTHER): Payer: Medicare Other | Admitting: Internal Medicine

## 2021-04-02 ENCOUNTER — Other Ambulatory Visit: Payer: Self-pay

## 2021-04-02 ENCOUNTER — Encounter: Payer: Self-pay | Admitting: Internal Medicine

## 2021-04-02 VITALS — BP 123/71 | HR 69 | Temp 97.1°F | Ht 64.0 in | Wt 140.0 lb

## 2021-04-02 DIAGNOSIS — E782 Mixed hyperlipidemia: Secondary | ICD-10-CM | POA: Diagnosis not present

## 2021-04-02 DIAGNOSIS — Z1231 Encounter for screening mammogram for malignant neoplasm of breast: Secondary | ICD-10-CM

## 2021-04-02 DIAGNOSIS — D75839 Thrombocytosis, unspecified: Secondary | ICD-10-CM

## 2021-04-02 DIAGNOSIS — Z0000 Encounter for general adult medical examination without abnormal findings: Secondary | ICD-10-CM | POA: Diagnosis not present

## 2021-04-02 DIAGNOSIS — Z1159 Encounter for screening for other viral diseases: Secondary | ICD-10-CM | POA: Diagnosis not present

## 2021-04-02 NOTE — Progress Notes (Signed)
HPI:  Pt presents to the clinic today for her subsequent annual Medicare Wellness exam.   Past Medical History:  Diagnosis Date   Allergy    Cancer ([place])    basal cell   Emphysema of lung ([place])    Hypertension     Current Outpatient Medications  Medication Sig Dispense Refill   aspirin EC 81 MG tablet Take 81 mg by mouth.     atorvastatin (LIPITOR) 20 MG tablet Take 1 tablet (20 mg total) by mouth daily. 90 tablet 3   fluticasone (FLONASE) 50 MCG/ACT nasal spray SPRAY 2 SPRAYS INTO EACH NOSTRIL EVERY DAY 48 mL 1   hydroxyurea (HYDREA) 500 MG capsule Take 1 capsule (500 mg total) by mouth daily. 90 capsule 1   metoprolol succinate (TOPROL-XL) 50 MG 24 hr tablet TAKE 1 TABLET BY MOUTH EVERY DAY WITH OR IMMEDIATELY FOLLOWING A MEAL 90 tablet 0   pantoprazole (PROTONIX) 40 MG tablet Take 1 tablet (40 mg total) by mouth daily. 90 tablet 0   No current facility-administered medications for this visit.    Allergies  Allergen Reactions   Codeine Swelling    Family History  Problem Relation Age of Onset   Leukemia Mother    Heart disease Father    Non-Hodgkin's lymphoma Daughter     Social History   Socioeconomic History   Marital status: Widowed    Spouse name: Not on file   Number of children: Not on file   Years of education: Not on file   Highest education level: Not on file  Occupational History   Not on file  Tobacco Use   Smoking status: Never   Smokeless tobacco: Never  Vaping Use   Vaping Use: Never used  Substance and Sexual Activity   Alcohol use: No    Alcohol/week: 0.0 standard drinks   Drug use: No   Sexual activity: Not on file  Other Topics Concern   Not on file  Social History Narrative   Not on file   Social Determinants of Health   Financial Resource Strain: Not on file  Food Insecurity: Not on file  Transportation Needs: Not on file  Physical Activity: Not on file  Stress: Not on file  Social Connections: Not on file  Intimate Partner  Violence: Not on file    Hospitiliaztions: None  Health Maintenance:    Flu: never  Tetanus: never  Pneumovax: never  Prevnar: never  Shingrix: never  Covid: [place] x 2  Mammogram: 07/2015  Pap Smear: 03/2012  Bone Density: never  Colon Screening: 12/2011  Eye Doctor: as needed  Dental Exam: biannually   Providers:   PCP: Webb Silversmith, NP  Hematology: Dr. Tasia Catchings  Dermatology: [facility]   I have personally reviewed and have noted:  1. The patient's medical and social history 2. Their use of alcohol, tobacco or illicit drugs 3. Their current medications and supplements 4. The patient's functional ability including ADL's, fall risks, home safety risks and hearing or visual impairment. 5. Diet and physical activities 6. Evidence for depression or mood disorder  Subjective:   Review of Systems:   Constitutional: Denies fever, malaise, fatigue, headache or abrupt weight changes.  HEENT: Denies eye pain, eye redness, ear pain, ringing in the ears, wax buildup, runny nose, nasal congestion, bloody nose, or sore throat. Respiratory: Denies difficulty breathing, shortness of breath, cough or sputum production.   Cardiovascular: Denies chest pain, chest tightness, palpitations or swelling in the hands or feet.  Gastrointestinal: Pt reports abdominal pain, alternating constipation or diarrhea. Denies abdominal pain, bloating, or blood in the stool.  GU: Denies urgency, frequency, pain with urination, burning sensation, blood in urine, odor or discharge. Musculoskeletal: Denies decrease in range of motion, difficulty with gait, muscle pain or joint pain and swelling.  Skin: Denies redness, rashes, lesions or ulcercations.  Neurological: Denies dizziness, difficulty with memory, difficulty with speech or problems with balance and coordination.  Psych: Denies anxiety, depression, SI/HI.  No other specific complaints in a complete review of systems (except as listed in HPI  above).  Objective:  PE:  BP 123/71 (BP Location: Right Arm, Patient Position: Sitting, Cuff Size: Normal)   Pulse 69   Temp (!) 97.1 F (36.2 C) (Temporal)   Ht 5\' 4"  (1.626 m)   Wt 140 lb (63.5 kg)   SpO2 100%   BMI 24.03 kg/m   Wt Readings from Last 3 Encounters:  02/05/21 140 lb 8 oz (63.7 kg)  12/05/20 143 lb (64.9 kg)  10/09/20 140 lb 12.8 oz (63.9 kg)    General: Appears her stated age, well developed, well nourished in NAD. Skin: Warm, dry and intact. Dry skin noted of forehead. HEENT: Head: normal shape and size; Eyes: sclera white and EOMs intact;  Neck: Neck supple, trachea midline. No masses, lumps or thyromegaly present.  Cardiovascular: Normal rate and rhythm. S1,S2 noted.  No murmur, rubs or gallops noted. No JVD or BLE edema. No carotid bruits noted. Pulmonary/Chest: Normal effort and positive vesicular breath sounds. No respiratory distress. No wheezes, rales or ronchi noted.  Abdomen: Soft and nontender. Hypoactive bowel sounds. No distention or masses noted. Liver, spleen and kidneys non palpable. Musculoskeletal: Kyphotic. Strength 5/5 BUE/BLE. No difficulty with gait. Neurological: Alert and oriented. Cranial nerves II-XII grossly intact. Coordination normal.  Psychiatric: Mood and affect normal. Behavior is normal. Judgment and thought content normal.    BMET    Component Value Date/Time   NA 137 02/05/2021 1331   NA 142 10/11/2016 0932   K 4.4 02/05/2021 1331   CL 103 02/05/2021 1331   CO2 28 02/05/2021 1331   GLUCOSE 100 (H) 02/05/2021 1331   BUN 14 02/05/2021 1331   BUN 12 10/11/2016 0932   CREATININE 0.84 02/05/2021 1331   CREATININE 0.90 08/07/2020 1001   CALCIUM 8.7 (L) 02/05/2021 1331   GFRNONAA >60 02/05/2021 1331   GFRNONAA 63 08/07/2020 1001   GFRAA 74 08/07/2020 1001    Lipid Panel     Component Value Date/Time   CHOL 134 07/05/2019 0836   CHOL 220 (H) 10/11/2016 0932   TRIG 138 07/05/2019 0836   HDL 35 (L) 07/05/2019 0836    HDL 42 10/11/2016 0932   CHOLHDL 3.8 07/05/2019 0836   LDLCALC 77 07/05/2019 0836    CBC    Component Value Date/Time   WBC 7.5 03/26/2021 1033   RBC 4.05 03/26/2021 1033   HGB 14.6 03/26/2021 1033   HGB 13.0 05/29/2015 1404   HCT 44.2 03/26/2021 1033   HCT 39.0 05/29/2015 1404   PLT 537 (H) 03/26/2021 1033   PLT 684 (H) 05/29/2015 1404   MCV 109.1 (H) 03/26/2021 1033   MCV 90 05/29/2015 1404   MCH 36.0 (H) 03/26/2021 1033   MCHC 33.0 03/26/2021 1033   RDW 13.4 03/26/2021 1033   RDW 13.6 05/29/2015 1404   LYMPHSABS 1.8 03/26/2021 1033   LYMPHSABS 3.5 (H) 05/29/2015 1404   MONOABS 0.5 03/26/2021 1033   EOSABS 0.1 03/26/2021 1033  EOSABS 0.3 05/29/2015 1404   BASOSABS 0.1 03/26/2021 1033   BASOSABS 0.1 05/29/2015 1404    Hgb A1C No results found for: HGBA1C    Assessment and Plan:   Medicare Annual Wellness Visit:  Diet: She does eat meat. She consumes fruits and veggies. She does eat some fried foods. She drinks mostly tea. Physical activity: Walking Depression/mood screen: Negative, PHQ 9 score of 3 Hearing: Intact to whispered voice Visual acuity: Grossly normal ADLs: Capable Fall risk: None Home safety: Good Cognitive evaluation: Intact to orientation, naming, recall and repetition EOL planning: Living Will, full code/ I agree  Preventative Medicine: She declines flu, tetanus, pneumovax, prevnar, shingrix. She does not want to get any more covid boosters (advised her to bring her vaccine card to her next visit for abstraction). She declines pap smear, bone density or colon cancer screening. Mammogram ordered - she will call to schedule. Encouraged her to consume a balanced diet and exercise regimen. Advised her to see an eye doctor and dentist annually. Will check CBC, CMET, Lipid and Hep C today. Due dates for screening exams given to patient as part of her AVS.   Next appointment: 6 months, follow up chronic conditions   Webb Silversmith, NP This visit  occurred during the SARS-CoV-2 public health emergency.  Safety protocols were in place, including screening questions prior to the visit, additional usage of staff PPE, and extensive cleaning of exam room while observing appropriate contact time as indicated for disinfecting solutions.

## 2021-04-02 NOTE — Patient Instructions (Signed)
Health Maintenance for Postmenopausal Women ?Menopause is a normal process in which your ability to get pregnant comes to an end. This process happens slowly over many months or years, usually between the ages of 48 and 55. Menopause is complete when you have missed your menstrual period for 12 months. ?It is important to talk with your health care provider about some of the most common conditions that affect women after menopause (postmenopausal women). These include heart disease, cancer, and bone loss (osteoporosis). Adopting a healthy lifestyle and getting preventive care can help to promote your health and wellness. The actions you take can also lower your chances of developing some of these common conditions. ?What are the signs and symptoms of menopause? ?During menopause, you may have the following symptoms: ?Hot flashes. These can be moderate or severe. ?Night sweats. ?Decrease in sex drive. ?Mood swings. ?Headaches. ?Tiredness (fatigue). ?Irritability. ?Memory problems. ?Problems falling asleep or staying asleep. ?Talk with your health care provider about treatment options for your symptoms. ?Do I need hormone replacement therapy? ?Hormone replacement therapy is effective in treating symptoms that are caused by menopause, such as hot flashes and night sweats. ?Hormone replacement carries certain risks, especially as you become older. If you are thinking about using estrogen or estrogen with progestin, discuss the benefits and risks with your health care provider. ?How can I reduce my risk for heart disease and stroke? ?The risk of heart disease, heart attack, and stroke increases as you age. One of the causes may be a change in the body's hormones during menopause. This can affect how your body uses dietary fats, triglycerides, and cholesterol. Heart attack and stroke are medical emergencies. There are many things that you can do to help prevent heart disease and stroke. ?Watch your blood pressure ?High  blood pressure causes heart disease and increases the risk of stroke. This is more likely to develop in people who have high blood pressure readings or are overweight. ?Have your blood pressure checked: ?Every 3-5 years if you are 18-39 years of age. ?Every year if you are 40 years old or older. ?Eat a healthy diet ? ?Eat a diet that includes plenty of vegetables, fruits, low-fat dairy products, and lean protein. ?Do not eat a lot of foods that are high in solid fats, added sugars, or sodium. ?Get regular exercise ?Get regular exercise. This is one of the most important things you can do for your health. Most adults should: ?Try to exercise for at least 150 minutes each week. The exercise should increase your heart rate and make you sweat (moderate-intensity exercise). ?Try to do strengthening exercises at least twice each week. Do these in addition to the moderate-intensity exercise. ?Spend less time sitting. Even light physical activity can be beneficial. ?Other tips ?Work with your health care provider to achieve or maintain a healthy weight. ?Do not use any products that contain nicotine or tobacco. These products include cigarettes, chewing tobacco, and vaping devices, such as e-cigarettes. If you need help quitting, ask your health care provider. ?Know your numbers. Ask your health care provider to check your cholesterol and your blood sugar (glucose). Continue to have your blood tested as directed by your health care provider. ?Do I need screening for cancer? ?Depending on your health history and family history, you may need to have cancer screenings at different stages of your life. This may include screening for: ?Breast cancer. ?Cervical cancer. ?Lung cancer. ?Colorectal cancer. ?What is my risk for osteoporosis? ?After menopause, you may be   at increased risk for osteoporosis. Osteoporosis is a condition in which bone destruction happens more quickly than new bone creation. To help prevent osteoporosis or  the bone fractures that can happen because of osteoporosis, you may take the following actions: ?If you are 19-50 years old, get at least 1,000 mg of calcium and at least 600 international units (IU) of vitamin D per day. ?If you are older than age 50 but younger than age 70, get at least 1,200 mg of calcium and at least 600 international units (IU) of vitamin D per day. ?If you are older than age 70, get at least 1,200 mg of calcium and at least 800 international units (IU) of vitamin D per day. ?Smoking and drinking excessive alcohol increase the risk of osteoporosis. Eat foods that are rich in calcium and vitamin D, and do weight-bearing exercises several times each week as directed by your health care provider. ?How does menopause affect my mental health? ?Depression may occur at any age, but it is more common as you become older. Common symptoms of depression include: ?Feeling depressed. ?Changes in sleep patterns. ?Changes in appetite or eating patterns. ?Feeling an overall lack of motivation or enjoyment of activities that you previously enjoyed. ?Frequent crying spells. ?Talk with your health care provider if you think that you are experiencing any of these symptoms. ?General instructions ?See your health care provider for regular wellness exams and vaccines. This may include: ?Scheduling regular health, dental, and eye exams. ?Getting and maintaining your vaccines. These include: ?Influenza vaccine. Get this vaccine each year before the flu season begins. ?Pneumonia vaccine. ?Shingles vaccine. ?Tetanus, diphtheria, and pertussis (Tdap) booster vaccine. ?Your health care provider may also recommend other immunizations. ?Tell your health care provider if you have ever been abused or do not feel safe at home. ?Summary ?Menopause is a normal process in which your ability to get pregnant comes to an end. ?This condition causes hot flashes, night sweats, decreased interest in sex, mood swings, headaches, or lack  of sleep. ?Treatment for this condition may include hormone replacement therapy. ?Take actions to keep yourself healthy, including exercising regularly, eating a healthy diet, watching your weight, and checking your blood pressure and blood sugar levels. ?Get screened for cancer and depression. Make sure that you are up to date with all your vaccines. ?This information is not intended to replace advice given to you by your health care provider. Make sure you discuss any questions you have with your health care provider. ?Document Revised: 08/28/2020 Document Reviewed: 08/28/2020 ?Elsevier Patient Education ? 2022 [employer] ? ?

## 2021-04-03 LAB — COMPLETE METABOLIC PANEL WITH GFR
AG Ratio: 1.6 (calc) (ref 1.0–2.5)
ALT: 13 U/L (ref 6–29)
AST: 17 U/L (ref 10–35)
Albumin: 4.1 g/dL (ref 3.6–5.1)
Alkaline phosphatase (APISO): 142 U/L (ref 37–153)
BUN: 17 mg/dL (ref 7–25)
CO2: 30 mmol/L (ref 20–32)
Calcium: 9.3 mg/dL (ref 8.6–10.4)
Chloride: 103 mmol/L (ref 98–110)
Creat: 0.9 mg/dL (ref 0.60–1.00)
Globulin: 2.6 g/dL (calc) (ref 1.9–3.7)
Glucose, Bld: 85 mg/dL (ref 65–99)
Potassium: 5.1 mmol/L (ref 3.5–5.3)
Sodium: 140 mmol/L (ref 135–146)
Total Bilirubin: 0.5 mg/dL (ref 0.2–1.2)
Total Protein: 6.7 g/dL (ref 6.1–8.1)
eGFR: 67 mL/min/{1.73_m2} (ref >=60)

## 2021-04-03 LAB — LIPID PANEL
Cholesterol: 142 mg/dL (ref <200)
HDL: 35 mg/dL — ABNORMAL LOW (ref >=50)
LDL Cholesterol (Calc): 74 mg/dL (calc)
Non-HDL Cholesterol (Calc): 107 mg/dL (calc) (ref <130)
Total CHOL/HDL Ratio: 4.1 (calc) (ref <5.0)
Triglycerides: 250 mg/dL — ABNORMAL HIGH (ref <150)

## 2021-04-03 LAB — CBC
HCT: 42.9 % (ref 35.0–45.0)
Hemoglobin: 14.8 g/dL (ref 11.7–15.5)
MCH: 37 pg — ABNORMAL HIGH (ref 27.0–33.0)
MCHC: 34.5 g/dL (ref 32.0–36.0)
MCV: 107.3 fL — ABNORMAL HIGH (ref 80.0–100.0)
MPV: 9.5 fL (ref 7.5–12.5)
Platelets: 503 10*3/uL — ABNORMAL HIGH (ref 140–400)
RBC: 4 10*6/uL (ref 3.80–5.10)
RDW: 13.3 % (ref 11.0–15.0)
WBC: 6.2 10*3/uL (ref 3.8–10.8)

## 2021-04-03 LAB — HEPATITIS C ANTIBODY
Hepatitis C Ab: NONREACTIVE
SIGNAL TO CUT-OFF: 0.04 (ref <1.00)

## 2021-04-11 ENCOUNTER — Other Ambulatory Visit: Payer: Self-pay | Admitting: Oncology

## 2021-04-11 NOTE — Telephone Encounter (Signed)
CBC Order: [id number] Status: Final result    Visible to patient: Yes (seen)    Next appt: 05/07/2021 at 02:00 PM in Oncology ([facility])    Dx: Thrombocytosis    0 Result Notes   1 Patient Communication Component Ref Range & Units 9 d ago  (04/02/21) 2 wk ago  (03/26/21) 2 mo ago  (02/05/21) 4 mo ago  (12/05/20) 6 mo ago  (10/09/20) 7 mo ago  (09/11/20) 7 mo ago  (08/21/20)  WBC 3.8 - 10.8 Thousand/uL 6.2  7.5 R  7.4 R  6.7 R  7.9 R  7.4 R  11.2 High  R   RBC 3.80 - 5.10 Million/uL 4.00  4.05 R  3.78 Low  R  3.99 R  4.64 R  4.98 R  5.18 High  R   Hemoglobin 11.7 - 15.5 g/dL 14.8  14.6 R  13.9 R  13.8 R  14.0 R  14.3 R  14.6 R   HCT 35.0 - 45.0 % 42.9  44.2 R  41.0 R  41.1 R  42.4 R  43.7 R  44.7 R   MCV 80.0 - 100.0 fL 107.3 High   109.1 High   108.5 High   103.0 High   91.4  87.8  86.3   MCH 27.0 - 33.0 pg 37.0 High   36.0 High  R  36.8 High  R  34.6 High  R  30.2 R  28.7 R  28.2 R   MCHC 32.0 - 36.0 g/dL 34.5  33.0 R  33.9 R  33.6 R  33.0 R  32.7 R  32.7 R   RDW 11.0 - 15.0 % 13.3  13.4 R  13.2 R  18.3 High  R  19.5 High  R  15.9 High  R  15.0 R   Platelets 140 - 400 Thousand/uL 503 High   537 High  R  447 High  R  510 High  R  541 High  R  684 High  R  881 High  R   MPV 7.5 - 12.5 fL 9.5         nRBC   0.0 R  0.0 R  0.0 R  0.0 R  0.0 R  0.0 R   Basophils Relative   1 R  1 R  2 R  2 R  2 R  1 R   Basophils Absolute   0.1 R  0.1 R  0.1 R  0.1 R  0.1 R  0.1 R   Immature Granulocytes   0 R  0 R  0 R  0 R  0 R  1 R   Abs Immature Granulocytes   0.02 R, CM  0.02 R, CM  0.02 R, CM  0.03 R, CM  0.02 R, CM  0.06 R, CM   Neutrophils Relative %   67 R  56 R  55 R  58 R  52 R  60 R   Neutro Abs   5.0 R  4.2 R  3.7 R  4.6 R  3.9 R  6.9 R   Lymphocytes Relative   24 R  34 R  33 R  30 R  35 R  28 R   Lymphs Abs   1.8 R  2.5 R  2.2 R  2.3 R  2.6 R  3.1 R   Monocytes Relative   6 R  7 R  7 R  7 R  8 R  7 R   Monocytes Absolute   0.5 R  0.5 R  0.5 R  0.5 R  0.6 R  0.8 R   Eosinophils Relative   2 R   2 R  3 R  3 R  3 R  3 R   Eosinophils Absolute   0.1 R  0.1 R  0.2 R  0.2 R  0.2 R  0.3 R   Resulting Agency  [facility] [place] [facility] [place] [facility] [place] [facility] [place] [facility] [place] [facility] [place] [facility]         Specimen Collected: 04/02/21 08:40 Last Resulted: 04/03/21 14:32

## 2021-05-07 ENCOUNTER — Inpatient Hospital Stay: Payer: Medicare Other | Admitting: Oncology

## 2021-05-07 ENCOUNTER — Encounter: Payer: Self-pay | Admitting: Oncology

## 2021-05-07 ENCOUNTER — Other Ambulatory Visit: Payer: Self-pay

## 2021-05-07 ENCOUNTER — Inpatient Hospital Stay: Payer: Medicare Other | Attending: Oncology

## 2021-05-07 VITALS — BP 142/63 | HR 66 | Temp 97.8°F | Resp 20 | Wt 141.1 lb

## 2021-05-07 DIAGNOSIS — Z634 Disappearance and death of family member: Secondary | ICD-10-CM | POA: Insufficient documentation

## 2021-05-07 DIAGNOSIS — Z79899 Other long term (current) drug therapy: Secondary | ICD-10-CM | POA: Insufficient documentation

## 2021-05-07 DIAGNOSIS — D473 Essential (hemorrhagic) thrombocythemia: Secondary | ICD-10-CM | POA: Insufficient documentation

## 2021-05-07 LAB — CBC WITH DIFFERENTIAL/PLATELET
Abs Immature Granulocytes: 0.02 10*3/uL (ref 0.00–0.07)
Basophils Absolute: 0.1 10*3/uL (ref 0.0–0.1)
Basophils Relative: 1 %
Eosinophils Absolute: 0.1 10*3/uL (ref 0.0–0.5)
Eosinophils Relative: 2 %
HCT: 41.1 % (ref 36.0–46.0)
Hemoglobin: 13.8 g/dL (ref 12.0–15.0)
Immature Granulocytes: 0 %
Lymphocytes Relative: 33 %
Lymphs Abs: 2.3 10*3/uL (ref 0.7–4.0)
MCH: 36.1 pg — ABNORMAL HIGH (ref 26.0–34.0)
MCHC: 33.6 g/dL (ref 30.0–36.0)
MCV: 107.6 fL — ABNORMAL HIGH (ref 80.0–100.0)
Monocytes Absolute: 0.4 10*3/uL (ref 0.1–1.0)
Monocytes Relative: 6 %
Neutro Abs: 4 10*3/uL (ref 1.7–7.7)
Neutrophils Relative %: 58 %
Platelets: 495 10*3/uL — ABNORMAL HIGH (ref 150–400)
RBC: 3.82 MIL/uL — ABNORMAL LOW (ref 3.87–5.11)
RDW: 13.2 % (ref 11.5–15.5)
WBC: 7 10*3/uL (ref 4.0–10.5)
nRBC: 0 % (ref 0.0–0.2)

## 2021-05-07 LAB — COMPREHENSIVE METABOLIC PANEL
ALT: 14 U/L (ref 0–44)
AST: 16 U/L (ref 15–41)
Albumin: 3.7 g/dL (ref 3.5–5.0)
Alkaline Phosphatase: 138 U/L — ABNORMAL HIGH (ref 38–126)
Anion gap: 5 (ref 5–15)
BUN: 17 mg/dL (ref 8–23)
CO2: 28 mmol/L (ref 22–32)
Calcium: 8.6 mg/dL — ABNORMAL LOW (ref 8.9–10.3)
Chloride: 103 mmol/L (ref 98–111)
Creatinine, Ser: 0.85 mg/dL (ref 0.44–1.00)
GFR, Estimated: >60 mL/min (ref >60)
Glucose, Bld: 101 mg/dL — ABNORMAL HIGH (ref 70–99)
Potassium: 3.8 mmol/L (ref 3.5–5.1)
Sodium: 136 mmol/L (ref 135–145)
Total Bilirubin: 0.8 mg/dL (ref 0.3–1.2)
Total Protein: 7 g/dL (ref 6.5–8.1)

## 2021-05-07 NOTE — Progress Notes (Signed)
Patient states her stomach cramps all the time. Patient states she has gas all the time.

## 2021-05-07 NOTE — Progress Notes (Signed)
Hematology/Oncology progress note [facility] [phone] [phone]   Patient Care Team: Jearld Fenton, NP as PCP - General (Internal Medicine)  REFERRING PROVIDER: Jearld Fenton, NP  CHIEF COMPLAINTS/REASON FOR VISIT:  Essential thrombocythemia  HISTORY OF PRESENTING ILLNESS:  Tiffany Richardson is a 76 y.o. female who was seen in consultation at the request of Baity, Coralie Keens, NP for evaluation of thromcbocytosis Reviewed patient's labs.  08/07/2020 labs showed elevated platelet counts at 904,000.  Normal wbc  and hemoglobin   Reviewed patient's previous labs. Thrombocytosis onset is chronic onset , duration since at least 2017 No aggravating or elevated factors. Associated symptoms or signs:  Denies weight loss, fever, chills, fatigue, night sweats.  Context:  Smoking history: Denies Family history of polycythemia.  Denies.  However his father had a history of needing frequent blood removal History of iron deficiency anemia: Denies History of DVT: Denies She also reports chronic abdominal bloating.  She has IBS.  Denies any unintentional weight loss, fever, nausea vomiting.  No recent colonoscopy in current EMR. She is widowed, husband had colon cancer.  # Jak 2 V61F mutation positive 08/21/2020 bone marrow biopsy results showed hypercellular bone marrow for age with features of myeloproliferative neoplasm.  Consistent with essential thrombocythemia, iron deficient polycythemia vera, pretty fibrotic/early primary myelofibrosis. Patient has no erythrocytosis.  No chronic leukocytosis.  Clinically most consistent with essential thrombocythemia.  # Chronic bloating and abdominal discomfort, possible due to chronic IBS symptoms.  Last colonoscopy 2013. Patient was seen by Dr. Vira Agar in 2019 and was recommended to increase soluble fiber, MiraLAX, if not helpful trial of FODMAP diet and probiotics.  Discussed with patient that I recommend  surveillance colonoscopy.   INTERVAL HISTORY Mrs. Tiffany Richardson presents back for lab work for management of essential thrombocythemia.  Currently on hydroxyurea 500 mg daily.  Appears to tolerate well.    In the interim, she continues to work 50 hours/week.  Admits to quite a bit of family stressors including the death of her husband 1 year ago and fighting with her siblings over family inheritance.  Has IBS and irregular bowel movements plus gas.  Had a tooth pulled in December.  Has slowly recovered but did develop a dry socket.  No fevers.  Review of Systems  Constitutional:  Positive for fatigue. Negative for appetite change, fever and unexpected weight change.  HENT:   Negative for nosebleeds, sore throat and trouble swallowing.   Eyes: Negative.   Respiratory: Negative.  Negative for cough, shortness of breath and wheezing.   Cardiovascular: Negative.  Negative for chest pain and leg swelling.  Gastrointestinal:  Positive for constipation and diarrhea. Negative for abdominal pain, blood in stool, nausea and vomiting.  Endocrine: Negative.   Genitourinary: Negative.  Negative for bladder incontinence, hematuria and nocturia.   Musculoskeletal: Negative.  Negative for back pain and flank pain.  Skin: Negative.   Neurological: Negative.  Negative for dizziness, headaches, light-headedness and numbness.  Hematological: Negative.   Psychiatric/Behavioral:  Negative for confusion. The patient is nervous/anxious.    MEDICAL HISTORY:  Past Medical History:  Diagnosis Date   Allergy    Cancer ([place])    basal cell   Emphysema of lung ([place])    Hypertension     SURGICAL HISTORY: Past Surgical History:  Procedure Laterality Date   basal cell removed     Rt eye   squamous cell removed       SOCIAL HISTORY: Social History  Socioeconomic History   Marital status: Widowed    Spouse name: Not on file   Number of children: Not on file   Years of education: Not on file   Highest education  level: Not on file  Occupational History   Not on file  Tobacco Use   Smoking status: Never   Smokeless tobacco: Never  Vaping Use   Vaping Use: Never used  Substance and Sexual Activity   Alcohol use: No    Alcohol/week: 0.0 standard drinks   Drug use: No   Sexual activity: Not on file  Other Topics Concern   Not on file  Social History Narrative   Not on file   Social Determinants of Health   Financial Resource Strain: Not on file  Food Insecurity: Not on file  Transportation Needs: Not on file  Physical Activity: Not on file  Stress: Not on file  Social Connections: Not on file  Intimate Partner Violence: Not on file    FAMILY HISTORY: Family History  Problem Relation Age of Onset   Leukemia Mother    Heart disease Father    Non-Hodgkin's lymphoma Daughter     ALLERGIES:  is allergic to codeine.  MEDICATIONS:  Current Outpatient Medications  Medication Sig Dispense Refill   aspirin EC 81 MG tablet Take 81 mg by mouth.     atorvastatin (LIPITOR) 20 MG tablet Take 1 tablet (20 mg total) by mouth daily. 90 tablet 3   fluticasone (FLONASE) 50 MCG/ACT nasal spray SPRAY 2 SPRAYS INTO EACH NOSTRIL EVERY DAY 48 mL 1   hydroxyurea (HYDREA) 500 MG capsule TAKE 1 CAPSULE (500 MG TOTAL) BY MOUTH DAILY. 90 capsule 1   metoprolol succinate (TOPROL-XL) 50 MG 24 hr tablet TAKE 1 TABLET BY MOUTH EVERY DAY WITH OR IMMEDIATELY FOLLOWING A MEAL 90 tablet 0   pantoprazole (PROTONIX) 40 MG tablet Take 1 tablet (40 mg total) by mouth daily. 90 tablet 0   No current facility-administered medications for this visit.     PHYSICAL EXAMINATION: ECOG PERFORMANCE STATUS: 0 - Asymptomatic Vitals:   05/07/21 1428  BP: (!) 142/63  Pulse: 66  Resp: 20  Temp: 97.8 F (36.6 C)  SpO2: 100%   Filed Weights   05/07/21 1428  Weight: 141 lb 1.6 oz (64 kg)    Physical Exam Constitutional:      Appearance: Normal appearance.  HENT:     Head: Normocephalic and atraumatic.  Eyes:      Pupils: Pupils are equal, round, and reactive to light.  Cardiovascular:     Rate and Rhythm: Normal rate and regular rhythm.     Heart sounds: Normal heart sounds. No murmur heard. Pulmonary:     Effort: Pulmonary effort is normal.     Breath sounds: Normal breath sounds. No wheezing.  Abdominal:     General: Bowel sounds are normal. There is no distension.     Palpations: Abdomen is soft.     Tenderness: There is no abdominal tenderness.  Musculoskeletal:        General: Normal range of motion.     Cervical back: Normal range of motion.  Skin:    General: Skin is warm and dry.     Findings: No rash.  Neurological:     Mental Status: She is alert and oriented to person, place, and time.  Psychiatric:        Judgment: Judgment normal.     LABORATORY DATA:  I have reviewed the data as listed  Lab Results  Component Value Date   WBC 7.0 05/07/2021   HGB 13.8 05/07/2021   HCT 41.1 05/07/2021   MCV 107.6 (H) 05/07/2021   PLT 495 (H) 05/07/2021   Recent Labs    08/07/20 1001 09/11/20 0840 10/09/20 0853 12/05/20 0927 02/05/21 1331 04/02/21 0840  NA 142   < > 138 140 137 140  K 5.3   < > 4.8 4.3 4.4 5.1  CL 105   < > 100 103 103 103  CO2 29   < > _0 GLUCOSE 88   < > 89 92 100* 85  BUN 15   < > _1 CREATININE 0.90   < > 0.91 0.90 0.84 0.90  CALCIUM 9.2   < > 8.9 8.9 8.7* 9.3  GFRNONAA 63   < > >60 >60 >60  --   GFRAA 74  --   --   --   --   --   PROT 6.7   < > 6.9 6.8 7.0 6.7  ALBUMIN  --    < > 3.6 3.8 3.7  --   AST 16   < > _2 ALT 10   < > _3 ALKPHOS  --    < > 136* 132* 132*  --   BILITOT 0.7   < > 0.6 0.7 0.8 0.5   < > = values in this interval not displayed.    Iron/TIBC/Ferritin/ %Sat    Component Value Date/Time   IRON 85 08/15/2020 1003   IRON 60 05/29/2015 1404   TIBC 337 08/15/2020 1003   TIBC 237 (L) 05/29/2015 1404   FERRITIN 31 08/15/2020 1003   FERRITIN 136 05/29/2015 1404   IRONPCTSAT 25 08/15/2020 1003    IRONPCTSAT 24 12/11/2015 1436      RADIOGRAPHIC STUDIES: I have personally reviewed the radiological images as listed and agreed with the findings in the report. No results found.     ASSESSMENT & PLAN: Mrs. Kemnitz presents for follow-up for essential thrombocythemia.  On hydroxyurea and is stable.   Clinically she is doing well.  Labs reviewed.  Platelet count is 595,000 which is slightly up from previous.  Platelet count tends to fluctuate.  Continue same dose hydroxyurea 500 mg daily.  Continue aspirin.  Disposition-RTC in 6 weeks with labs only and in 12 weeks for lab work and see Dr. Tasia Catchings. No diagnosis found.  I spent 25 minutes dedicated to the care of this patient (face-to-face and non-face-to-face) on the date of the encounter to include what is described in the assessment and plan.  All questions were answered. The patient knows to call the clinic with any problems questions or concerns.  Cc Jearld Fenton, NP  Faythe Casa, NP 05/07/2021 3:28 PM

## 2021-05-17 ENCOUNTER — Other Ambulatory Visit: Payer: Self-pay | Admitting: Internal Medicine

## 2021-05-17 DIAGNOSIS — J3089 Other allergic rhinitis: Secondary | ICD-10-CM

## 2021-05-17 DIAGNOSIS — I10 Essential (primary) hypertension: Secondary | ICD-10-CM

## 2021-05-17 NOTE — Telephone Encounter (Signed)
Requested Prescriptions  Pending Prescriptions Disp Refills   pantoprazole (PROTONIX) 40 MG tablet [Pharmacy Med Name: PANTOPRAZOLE SOD DR 40 MG TAB] 90 tablet 0    Sig: TAKE 1 TABLET BY MOUTH EVERY DAY     Gastroenterology: Proton Pump Inhibitors Passed - 05/17/2021 10:24 AM      Passed - Valid encounter within last 12 months    Recent Outpatient Visits          1 month ago Medicare annual wellness visit, subsequent   [place], NP   7 months ago Primary hypertension   [facility] [place], Coralie Keens, NP   9 months ago Irritable bowel syndrome with diarrhea   [place], DO   1 year ago Primary hypertension   [place], FNP   1 year ago Essential hypertension   [facility], Lupita Raider, [place]

## 2021-05-17 NOTE — Telephone Encounter (Signed)
Requested Prescriptions  Pending Prescriptions Disp Refills   fluticasone (FLONASE) 50 MCG/ACT nasal spray [Pharmacy Med Name: FLUTICASONE PROP 50 MCG SPRAY] 48 mL 1    Sig: SPRAY 2 SPRAYS INTO EACH NOSTRIL EVERY DAY     Ear, Nose, and Throat: Nasal Preparations - Corticosteroids Passed - 05/17/2021  6:56 AM      Passed - Valid encounter within last 12 months    Recent Outpatient Visits          1 month ago Medicare annual wellness visit, subsequent   [facility] [place], Coralie Keens, NP   7 months ago Primary hypertension   [facility] [place], Coralie Keens, NP   9 months ago Irritable bowel syndrome with diarrhea   [place], Devonne Doughty, DO   1 year ago Primary hypertension   [place], FNP   1 year ago Essential hypertension   [facility], Lupita Raider, FNP              metoprolol succinate (TOPROL-XL) 50 MG 24 hr tablet [Pharmacy Med Name: METOPROLOL SUCC ER 50 MG TAB] 90 tablet 0    Sig: TAKE 1 TABLET BY MOUTH EVERY DAY WITH OR IMMEDIATELY FOLLOWING A MEAL     Cardiovascular:  Beta Blockers Failed - 05/17/2021  6:56 AM      Failed - Last BP in normal range    BP Readings from Last 1 Encounters:  05/07/21 (!) 142/63         Passed - Last Heart Rate in normal range    Pulse Readings from Last 1 Encounters:  05/07/21 66         Passed - Valid encounter within last 6 months    Recent Outpatient Visits          1 month ago Medicare annual wellness visit, subsequent   [place], Coralie Keens, NP   7 months ago Primary hypertension   [facility] [place], Coralie Keens, NP   9 months ago Irritable bowel syndrome with diarrhea   [place], Devonne Doughty, DO   1 year ago Primary hypertension   [place], FNP   1 year ago Essential hypertension   [facility], Lupita Raider, [place]

## 2021-05-28 DIAGNOSIS — L820 Inflamed seborrheic keratosis: Secondary | ICD-10-CM | POA: Diagnosis not present

## 2021-05-28 DIAGNOSIS — Z08 Encounter for follow-up examination after completed treatment for malignant neoplasm: Secondary | ICD-10-CM | POA: Diagnosis not present

## 2021-05-28 DIAGNOSIS — Z85828 Personal history of other malignant neoplasm of skin: Secondary | ICD-10-CM | POA: Diagnosis not present

## 2021-05-28 DIAGNOSIS — Z7189 Other specified counseling: Secondary | ICD-10-CM | POA: Diagnosis not present

## 2021-05-28 DIAGNOSIS — D2271 Melanocytic nevi of right lower limb, including hip: Secondary | ICD-10-CM | POA: Diagnosis not present

## 2021-05-28 DIAGNOSIS — D485 Neoplasm of uncertain behavior of skin: Secondary | ICD-10-CM | POA: Diagnosis not present

## 2021-05-28 DIAGNOSIS — D2262 Melanocytic nevi of left upper limb, including shoulder: Secondary | ICD-10-CM | POA: Diagnosis not present

## 2021-05-28 DIAGNOSIS — L718 Other rosacea: Secondary | ICD-10-CM | POA: Diagnosis not present

## 2021-05-28 DIAGNOSIS — L570 Actinic keratosis: Secondary | ICD-10-CM | POA: Diagnosis not present

## 2021-05-28 DIAGNOSIS — L821 Other seborrheic keratosis: Secondary | ICD-10-CM | POA: Diagnosis not present

## 2021-05-28 DIAGNOSIS — D2272 Melanocytic nevi of left lower limb, including hip: Secondary | ICD-10-CM | POA: Diagnosis not present

## 2021-06-12 ENCOUNTER — Other Ambulatory Visit: Payer: Self-pay | Admitting: *Deleted

## 2021-06-12 DIAGNOSIS — D471 Chronic myeloproliferative disease: Secondary | ICD-10-CM

## 2021-06-12 DIAGNOSIS — D473 Essential (hemorrhagic) thrombocythemia: Secondary | ICD-10-CM

## 2021-06-18 ENCOUNTER — Inpatient Hospital Stay: Payer: Medicare Other | Attending: Oncology

## 2021-06-18 ENCOUNTER — Other Ambulatory Visit: Payer: Self-pay

## 2021-06-18 DIAGNOSIS — L570 Actinic keratosis: Secondary | ICD-10-CM | POA: Diagnosis not present

## 2021-06-18 DIAGNOSIS — D473 Essential (hemorrhagic) thrombocythemia: Secondary | ICD-10-CM | POA: Diagnosis not present

## 2021-06-18 DIAGNOSIS — D471 Chronic myeloproliferative disease: Secondary | ICD-10-CM

## 2021-06-18 DIAGNOSIS — Z79899 Other long term (current) drug therapy: Secondary | ICD-10-CM | POA: Diagnosis not present

## 2021-06-18 DIAGNOSIS — L538 Other specified erythematous conditions: Secondary | ICD-10-CM | POA: Diagnosis not present

## 2021-06-18 DIAGNOSIS — L820 Inflamed seborrheic keratosis: Secondary | ICD-10-CM | POA: Diagnosis not present

## 2021-06-18 DIAGNOSIS — X32XXXA Exposure to sunlight, initial encounter: Secondary | ICD-10-CM | POA: Diagnosis not present

## 2021-06-18 LAB — COMPREHENSIVE METABOLIC PANEL
ALT: 17 U/L (ref 0–44)
AST: 20 U/L (ref 15–41)
Albumin: 3.7 g/dL (ref 3.5–5.0)
Alkaline Phosphatase: 126 U/L (ref 38–126)
Anion gap: 5 (ref 5–15)
BUN: 16 mg/dL (ref 8–23)
CO2: 30 mmol/L (ref 22–32)
Calcium: 8.7 mg/dL — ABNORMAL LOW (ref 8.9–10.3)
Chloride: 103 mmol/L (ref 98–111)
Creatinine, Ser: 1.01 mg/dL — ABNORMAL HIGH (ref 0.44–1.00)
GFR, Estimated: 58 mL/min — ABNORMAL LOW (ref >60)
Glucose, Bld: 98 mg/dL (ref 70–99)
Potassium: 4.4 mmol/L (ref 3.5–5.1)
Sodium: 138 mmol/L (ref 135–145)
Total Bilirubin: 0.6 mg/dL (ref 0.3–1.2)
Total Protein: 7 g/dL (ref 6.5–8.1)

## 2021-06-18 LAB — CBC WITH DIFFERENTIAL/PLATELET
Abs Immature Granulocytes: 0.03 10*3/uL (ref 0.00–0.07)
Basophils Absolute: 0.1 10*3/uL (ref 0.0–0.1)
Basophils Relative: 1 %
Eosinophils Absolute: 0.2 10*3/uL (ref 0.0–0.5)
Eosinophils Relative: 2 %
HCT: 43.2 % (ref 36.0–46.0)
Hemoglobin: 14.3 g/dL (ref 12.0–15.0)
Immature Granulocytes: 0 %
Lymphocytes Relative: 30 %
Lymphs Abs: 2.1 10*3/uL (ref 0.7–4.0)
MCH: 36.3 pg — ABNORMAL HIGH (ref 26.0–34.0)
MCHC: 33.1 g/dL (ref 30.0–36.0)
MCV: 109.6 fL — ABNORMAL HIGH (ref 80.0–100.0)
Monocytes Absolute: 0.4 10*3/uL (ref 0.1–1.0)
Monocytes Relative: 6 %
Neutro Abs: 4.2 10*3/uL (ref 1.7–7.7)
Neutrophils Relative %: 61 %
Platelets: 454 10*3/uL — ABNORMAL HIGH (ref 150–400)
RBC: 3.94 MIL/uL (ref 3.87–5.11)
RDW: 13.3 % (ref 11.5–15.5)
WBC: 7 10*3/uL (ref 4.0–10.5)
nRBC: 0 % (ref 0.0–0.2)

## 2021-06-19 ENCOUNTER — Other Ambulatory Visit: Payer: Self-pay | Admitting: Oncology

## 2021-06-19 ENCOUNTER — Other Ambulatory Visit: Payer: Medicare HMO

## 2021-06-19 DIAGNOSIS — D473 Essential (hemorrhagic) thrombocythemia: Secondary | ICD-10-CM

## 2021-07-02 ENCOUNTER — Ambulatory Visit: Payer: Self-pay | Admitting: *Deleted

## 2021-07-02 NOTE — Telephone Encounter (Signed)
FYI... Pt has apt 07/04/2021 ? ? ?Thanks,  ? ?-Mickel Baas  ?

## 2021-07-02 NOTE — Telephone Encounter (Signed)
Will discuss at upcoming appt.

## 2021-07-02 NOTE — Telephone Encounter (Signed)
Reason for Disposition ?? Abdominal pain is a chronic symptom (recurrent or ongoing AND present > 4 weeks) ? ?Answer Assessment - Initial Assessment Questions ?1. LOCATION: "Where does it hurt?"  ?    Left side, radiates to above navel ?2. RADIATION: "Does the pain shoot anywhere else?" (e.g., chest, back) ?    no ?3. ONSET: "When did the pain begin?" (e.g., minutes, hours or days ago)  ?    Last night ?4. SUDDEN: "Gradual or sudden onset?" ?    Gradual ?5. PATTERN "Does the pain come and go, or is it constant?" ?   - If constant: "Is it getting better, staying the same, or worsening?"  ?    (Note: Constant means the pain never goes away completely; most serious pain is constant and it progresses)  ?   - If intermittent: "How long does it last?" "Do you have pain now?" ?    (Note: Intermittent means the pain goes away completely between bouts) ?    Constant ?6. SEVERITY: "How bad is the pain?"  (e.g., Scale 1-10; mild, moderate, or severe) ?  - MILD (1-3): doesn't interfere with normal activities, abdomen soft and not tender to touch  ?  - MODERATE (4-7): interferes with normal activities or awakens from sleep, abdomen tender to touch  ?  - SEVERE (8-10): excruciating pain, doubled over, unable to do any normal activities  ?    5/10 ?7. RECURRENT SYMPTOM: "Have you ever had this type of stomach pain before?" If Yes, ask: "When was the last time?" and "What happened that time?"  ?    Years, worsening flare ups ?8. CAUSE: "What do you think is causing the stomach pain?" ?     ?9. RELIEVING/AGGRAVATING FACTORS: "What makes it better or worse?" (e.g., movement, antacids, bowel movement) ?     ?10. OTHER SYMPTOMS: "Do you have any other symptoms?" (e.g., back pain, diarrhea, fever, urination pain, vomiting) ?      Diarrhea,4 times. Nausea last night ? ?Protocols used: Abdominal Pain - Female-A-AH ? ?

## 2021-07-02 NOTE — Telephone Encounter (Signed)
?  Chief Complaint: Abdominal pain ?Symptoms: Left sided abd pain, "Goes to left side of belly button."Diarrhea, onset last night ?Frequency: "For years" Flare ups more frequent ?Pertinent Negatives: Patient denies fever, dizziness ?Disposition: '[]'$ ED /'[]'$ Urgent Care (no appt availability in office) / '[x]'$ Appointment(In office/virtual)/ '[]'$  [facility]/ '[]'$ Home Care/ '[]'$ Refused Recommended Disposition /'[]'$  Mobile Bus/ '[]'$  Follow-up with PCP ?Additional Notes: Appt secured for Wednesday, would like sooner appt if available. Care advise given. ?Advised ED for worsening symptoms. ?

## 2021-07-04 ENCOUNTER — Ambulatory Visit (INDEPENDENT_AMBULATORY_CARE_PROVIDER_SITE_OTHER): Payer: Medicare Other | Admitting: Internal Medicine

## 2021-07-04 ENCOUNTER — Encounter: Payer: Self-pay | Admitting: Internal Medicine

## 2021-07-04 ENCOUNTER — Other Ambulatory Visit: Payer: Self-pay

## 2021-07-04 VITALS — BP 130/66 | HR 73 | Temp 97.1°F | Wt 141.0 lb

## 2021-07-04 DIAGNOSIS — R197 Diarrhea, unspecified: Secondary | ICD-10-CM

## 2021-07-04 DIAGNOSIS — R110 Nausea: Secondary | ICD-10-CM | POA: Diagnosis not present

## 2021-07-04 DIAGNOSIS — R1012 Left upper quadrant pain: Secondary | ICD-10-CM

## 2021-07-04 DIAGNOSIS — K582 Mixed irritable bowel syndrome: Secondary | ICD-10-CM | POA: Diagnosis not present

## 2021-07-04 NOTE — Patient Instructions (Signed)
Diarrhea, Adult °Diarrhea is when you pass loose and watery poop (stool) often. Diarrhea can make you feel weak and cause you to lose water in your body (get dehydrated). Losing water in your body can cause you to: °Feel tired and thirsty. °Have a dry mouth. °Go pee (urinate) less often. °Diarrhea often lasts 2-3 days. However, it can last longer if it is a sign of something more serious. It is important to treat your diarrhea as told by your doctor. °Follow these instructions at home: °Eating and drinking °  °Follow these instructions as told by your doctor: °Take an ORS (oral rehydration solution). This is a drink that helps you replace fluids and minerals your body lost. It is sold at pharmacies and stores. °Drink plenty of fluids, such as: °Water. °Ice chips. °Diluted fruit juice. °Low-calorie sports drinks. °Milk, if you want. °Avoid drinking fluids that have a lot of sugar or caffeine in them. °Eat bland, easy-to-digest foods in small amounts as you are able. These foods include: °Bananas. °Applesauce. °Rice. °Low-fat (lean) meats. °Toast. °Crackers. °Avoid alcohol. °Avoid spicy or fatty foods. ° °Medicines °Take over-the-counter and prescription medicines only as told by your doctor. °If you were prescribed an antibiotic medicine, take it as told by your doctor. Do not stop using the antibiotic even if you start to feel better. °General instructions ° °Wash your hands often using soap and water. If soap and water are not available, use a hand sanitizer. Others in your home should wash their hands as well. Hands should be washed: °After using the toilet or changing a diaper. °Before preparing, cooking, or serving food. °While caring for a sick person. °While visiting someone in a hospital. °Drink enough fluid to keep your pee (urine) pale yellow. °Rest at home while you get better. °Take a warm bath to help with any burning or pain from having diarrhea. °Watch your condition for any changes. °Keep all  follow-up visits as told by your doctor. This is important. °Contact a doctor if: °You have a fever. °Your diarrhea gets worse. °You have new symptoms. °You cannot keep fluids down. °You feel light-headed or dizzy. °You have a headache. °You have muscle cramps. °Get help right away if: °You have chest pain. °You feel very weak or you pass out (faint). °You have bloody or black poop or poop that looks like tar. °You have very bad pain, cramping, or bloating in your belly (abdomen). °You have trouble breathing or you are breathing very quickly. °Your heart is beating very quickly. °Your skin feels cold and clammy. °You feel confused. °You have signs of losing too much water in your body, such as: °Dark pee, very little pee, or no pee. °Cracked lips. °Dry mouth. °Sunken eyes. °Sleepiness. °Weakness. °Summary °Diarrhea is when you pass loose and watery poop (stool) often. °Diarrhea can make you feel weak and cause you to lose water in your body (get dehydrated). °Take an ORS (oral rehydration solution). This is a drink that is sold at pharmacies and stores. °Eat bland, easy-to-digest foods in small amounts as you are able. °Contact a doctor if your condition gets worse. Get help right away if you have signs that you have lost too much water in your body. °This information is not intended to replace advice given to you by your health care provider. Make sure you discuss any questions you have with your health care provider. °Document Revised: 10/18/2020 Document Reviewed: 10/18/2020 °Elsevier Patient Education © 2022 [employer] ° °

## 2021-07-04 NOTE — Progress Notes (Signed)
? ?Subjective:  ? ? Patient ID: Tiffany Richardson, female    DOB: 08/11/1945, 76 y.o.   MRN: [id number] ? ?HPI ? ?Patient presents to clinic today with complaint of LUq abdominal pain and diarrhea.  This started 1 week ago. She has had some nausea but denies vomiting or blood in her stool. She has taken Imodium OTC as needed. She has a history of IBS.  She is not currently taking any prescription medications for this at this time.  Colonoscopy from 12/2017 reviewed in [place]. ? ?Review of Systems ? ?   ?Past Medical History:  ?Diagnosis Date  ? Allergy   ? Cancer [facility])   ? basal cell  ? Emphysema of lung ([place])   ? Hypertension   ? ? ?Current Outpatient Medications  ?Medication Sig Dispense Refill  ? aspirin EC 81 MG tablet Take 81 mg by mouth.    ? atorvastatin (LIPITOR) 20 MG tablet Take 1 tablet (20 mg total) by mouth daily. 90 tablet 3  ? fluticasone (FLONASE) 50 MCG/ACT nasal spray SPRAY 2 SPRAYS INTO EACH NOSTRIL EVERY DAY 48 mL 1  ? hydroxyurea (HYDREA) 500 MG capsule TAKE 1 CAPSULE (500 MG TOTAL) BY MOUTH DAILY. 90 capsule 1  ? metoprolol succinate (TOPROL-XL) 50 MG 24 hr tablet TAKE 1 TABLET BY MOUTH EVERY DAY WITH OR IMMEDIATELY FOLLOWING A MEAL 90 tablet 0  ? pantoprazole (PROTONIX) 40 MG tablet TAKE 1 TABLET BY MOUTH EVERY DAY 90 tablet 0  ? ?No current facility-administered medications for this visit.  ? ? ?Allergies  ?Allergen Reactions  ? Codeine Swelling  ? ? ?Family History  ?Problem Relation Age of Onset  ? Leukemia Mother   ? Heart disease Father   ? Non-Hodgkin's lymphoma Daughter   ? ? ?Social History  ? ?Socioeconomic History  ? Marital status: Widowed  ?  Spouse name: Not on file  ? Number of children: Not on file  ? Years of education: Not on file  ? Highest education level: Not on file  ?Occupational History  ? Not on file  ?Tobacco Use  ? Smoking status: Never  ? Smokeless tobacco: Never  ?Vaping Use  ? Vaping Use: Never used  ?Substance and Sexual Activity  ? Alcohol use: No  ?   Alcohol/week: 0.0 standard drinks  ? Drug use: No  ? Sexual activity: Not on file  ?Other Topics Concern  ? Not on file  ?Social History Narrative  ? Not on file  ? ?Social Determinants of Health  ? ?Financial Resource Strain: Not on file  ?Food Insecurity: Not on file  ?Transportation Needs: Not on file  ?Physical Activity: Not on file  ?Stress: Not on file  ?Social Connections: Not on file  ?Intimate Partner Violence: Not on file  ? ? ? ?Constitutional: Denies fever, malaise, fatigue, headache or abrupt weight changes.  ?Respiratory: Denies difficulty breathing, shortness of breath, cough or sputum production.   ?Cardiovascular: Denies chest pain, chest tightness, palpitations or swelling in the hands or feet.  ?Gastrointestinal: Patient reports LUQ abdominal pain, nausea and diarrhea.  Denies abdominal pain, bloating, constipation, or blood in the stool.  ?GU: Denies urgency, frequency, pain with urination, burning sensation, blood in urine, odor or discharge. ? ?No other specific complaints in a complete review of systems (except as listed in HPI above). ? ?Objective:  ? Physical Exam ? ?BP 130/66 (BP Location: Left Arm, Patient Position: Sitting, Cuff Size: Normal)   Pulse 73   Temp Marland Kitchen)  97.1 ?F (36.2 ?C) (Temporal)   Wt 141 lb (64 kg)   SpO2 99%   BMI 24.20 kg/m?  ? ?Wt Readings from Last 3 Encounters:  ?05/07/21 141 lb 1.6 oz (64 kg)  ?04/02/21 140 lb (63.5 kg)  ?02/05/21 140 lb 8 oz (63.7 kg)  ? ? ?General: Appears her stated age, well developed, well nourished in NAD. ?Cardiovascular: Normal rate and rhythm. S1,S2 noted.  No murmur, rubs or gallops noted.  ?Pulmonary/Chest: Normal effort and positive vesicular breath sounds. No respiratory distress. No wheezes, rales or ronchi noted.  ?Abdomen: Soft and mildly tender in the LUQ. Normal bowel sounds. No distention or masses noted.  ?Musculoskeletal: No difficulty with gait.  ?Neurological: Alert and oriented.  ? ?BMET ?   ?Component Value Date/Time  ? NA  138 06/18/2021 1108  ? NA 142 10/11/2016 0932  ? K 4.4 06/18/2021 1108  ? CL 103 06/18/2021 1108  ? CO2 30 06/18/2021 1108  ? GLUCOSE 98 06/18/2021 1108  ? BUN 16 06/18/2021 1108  ? BUN 12 10/11/2016 0932  ? CREATININE 1.01 (H) 06/18/2021 1108  ? CREATININE 0.90 04/02/2021 0840  ? CALCIUM 8.7 (L) 06/18/2021 1108  ? GFRNONAA 58 (L) 06/18/2021 1108  ? GFRNONAA 63 08/07/2020 1001  ? GFRAA 74 08/07/2020 1001  ? ? ?Lipid Panel  ?   ?Component Value Date/Time  ? CHOL 142 04/02/2021 0840  ? CHOL 220 (H) 10/11/2016 0932  ? TRIG 250 (H) 04/02/2021 0840  ? HDL 35 (L) 04/02/2021 0840  ? HDL 42 10/11/2016 0932  ? CHOLHDL 4.1 04/02/2021 0840  ? [place] 74 04/02/2021 0840  ? ? ?CBC ?   ?Component Value Date/Time  ? WBC 7.0 06/18/2021 1108  ? RBC 3.94 06/18/2021 1108  ? HGB 14.3 06/18/2021 1108  ? HGB 13.0 05/29/2015 1404  ? HCT 43.2 06/18/2021 1108  ? HCT 39.0 05/29/2015 1404  ? PLT 454 (H) 06/18/2021 1108  ? PLT 684 (H) 05/29/2015 1404  ? MCV 109.6 (H) 06/18/2021 1108  ? MCV 90 05/29/2015 1404  ? MCH 36.3 (H) 06/18/2021 1108  ? MCHC 33.1 06/18/2021 1108  ? RDW 13.3 06/18/2021 1108  ? RDW 13.6 05/29/2015 1404  ? LYMPHSABS 2.1 06/18/2021 1108  ? LYMPHSABS 3.5 (H) 05/29/2015 1404  ? MONOABS 0.4 06/18/2021 1108  ? EOSABS 0.2 06/18/2021 1108  ? EOSABS 0.3 05/29/2015 1404  ? BASOSABS 0.1 06/18/2021 1108  ? BASOSABS 0.1 05/29/2015 1404  ? ? ?Hgb A1C ?No results found for: HGBA1C ? ? ? ? ? ?   ?Assessment & Plan:  ? ?LUQ Abdominal Pain, Nausea, Diarrhea, with History of IBS: ? ?She has been under a lot of stress lately which could be causing IBS flare ?CT abd/pelvis with contrast to r/o diverticulitis ?Encouraged her to consume a bland, fiber based diet ?Continue Imodium OTC as needed ? ?Will follow up after imaging with further recommendation and treatment plan ?Webb Silversmith, NP ?This visit occurred during the SARS-CoV-2 public health emergency.  Safety protocols were in place, including screening questions prior to the visit,  additional usage of staff PPE, and extensive cleaning of exam room while observing appropriate contact time as indicated for disinfecting solutions.  ? ?

## 2021-07-06 ENCOUNTER — Ambulatory Visit
Admission: RE | Admit: 2021-07-06 | Discharge: 2021-07-06 | Disposition: A | Discharge status: Home / Self Care | Payer: Medicare HMO | Source: Ambulatory Visit | Attending: Internal Medicine | Admitting: Internal Medicine

## 2021-07-06 DIAGNOSIS — K5900 Constipation, unspecified: Secondary | ICD-10-CM | POA: Diagnosis not present

## 2021-07-06 DIAGNOSIS — K582 Mixed irritable bowel syndrome: Secondary | ICD-10-CM | POA: Insufficient documentation

## 2021-07-06 DIAGNOSIS — R1012 Left upper quadrant pain: Secondary | ICD-10-CM | POA: Insufficient documentation

## 2021-07-06 DIAGNOSIS — R197 Diarrhea, unspecified: Secondary | ICD-10-CM | POA: Insufficient documentation

## 2021-07-06 IMAGING — CT CT ABD-PELV W/ CM
2 of 5 series · 15 of 46 positions shown, 17 images · IV contrast (APPLIED)
Comparison: CT abdomen pelvis [DATE]

CLINICAL DATA: Diarrhea LUQ abdominal pain

EXAM:
CT ABDOMEN AND PELVIS WITH CONTRAST
TECHNIQUE: Multidetector CT imaging of the abdomen and pelvis was performed
using the standard protocol following bolus administration of
intravenous contrast.

[Series 2: routine abd/pel with · axial · 0.68mm/px · z∈[-513,-93]mm · 12 of 96 slices shown, 14 images]
[im 6/96  soft-tissue]
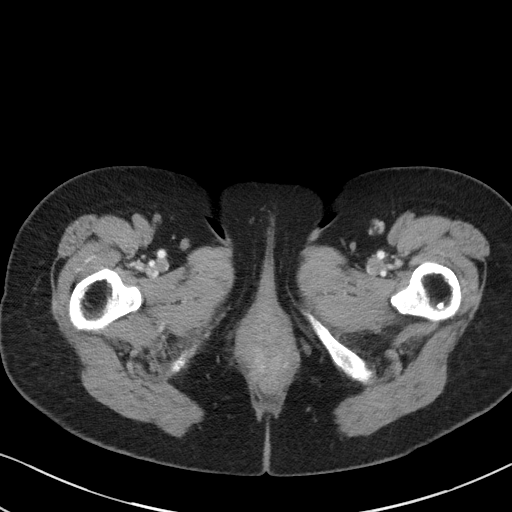
[im 6/96  bone]
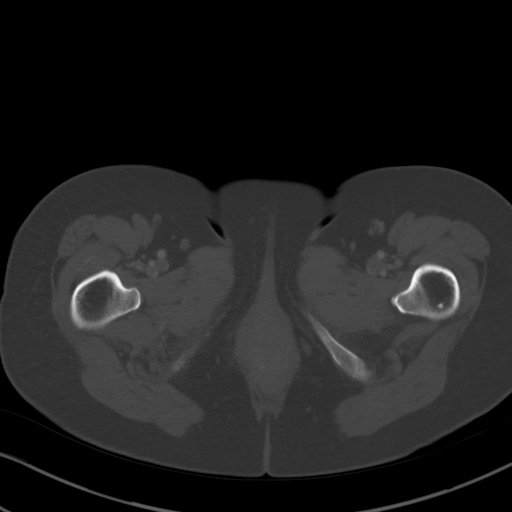
[im 16/96  soft-tissue]
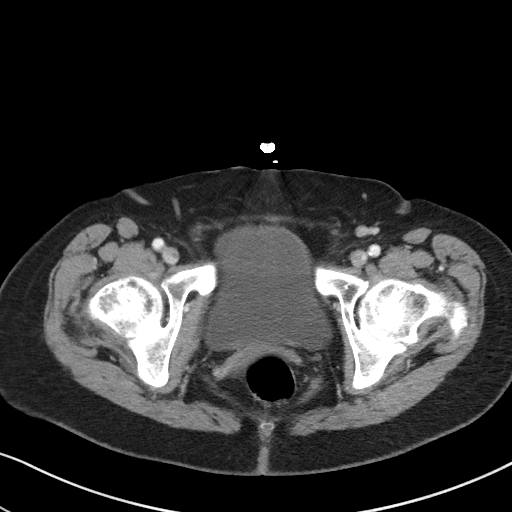
[im 22/96  soft-tissue]
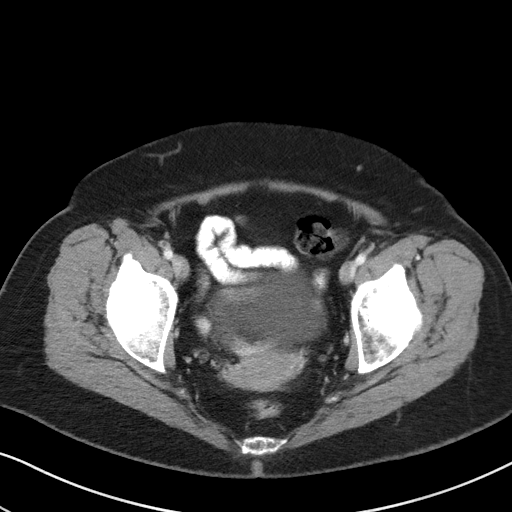
[im 27/96  soft-tissue]
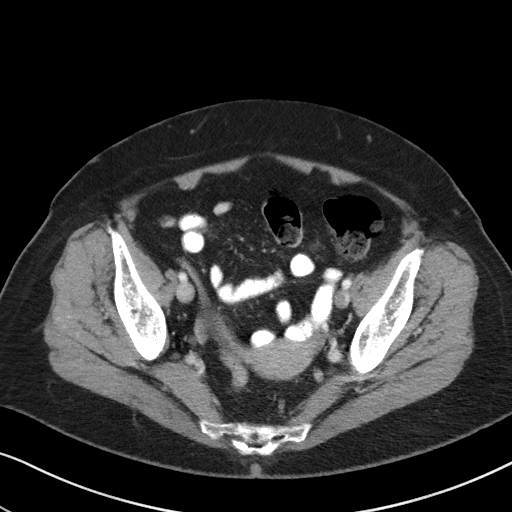
[im 37/96  soft-tissue]
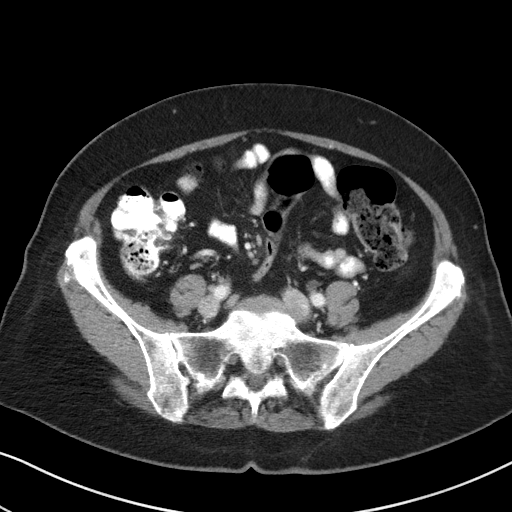
[im 43/96  soft-tissue]
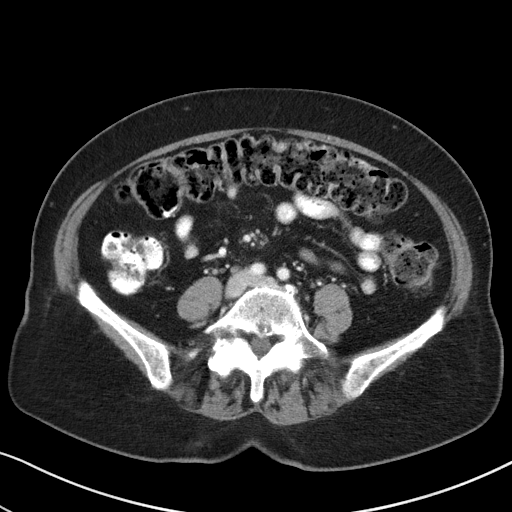
[im 53/96  soft-tissue]
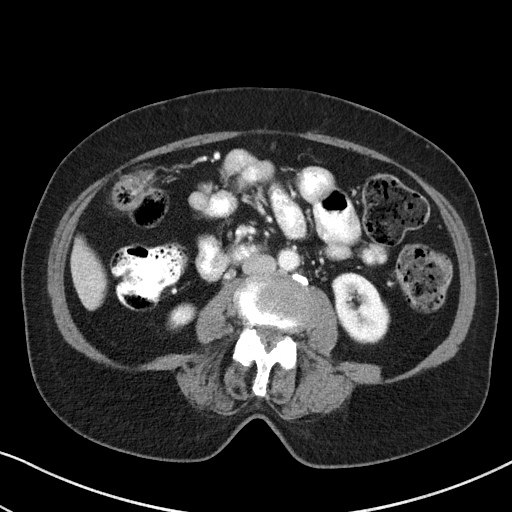
[im 59/96  soft-tissue]
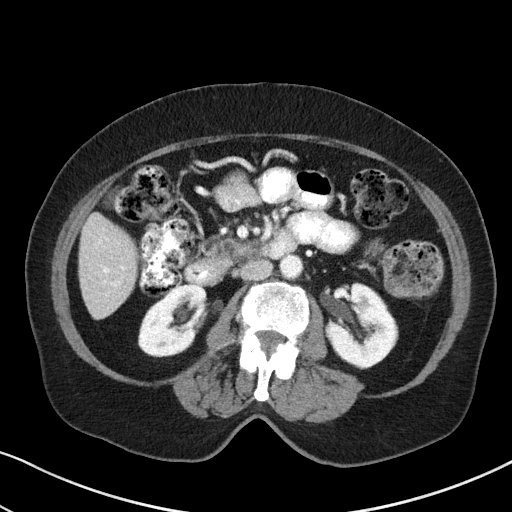
[im 69/96  soft-tissue]
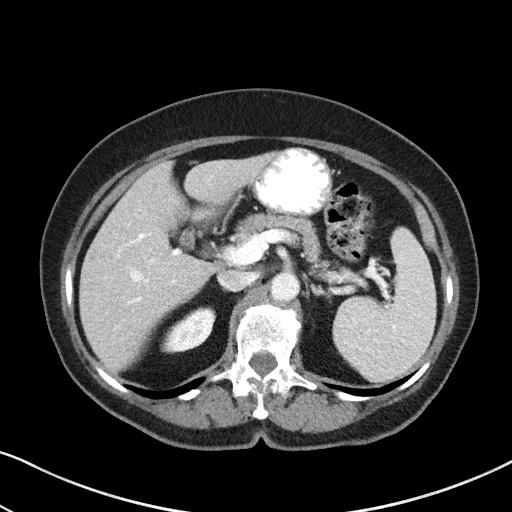
[im 69/96  bone]
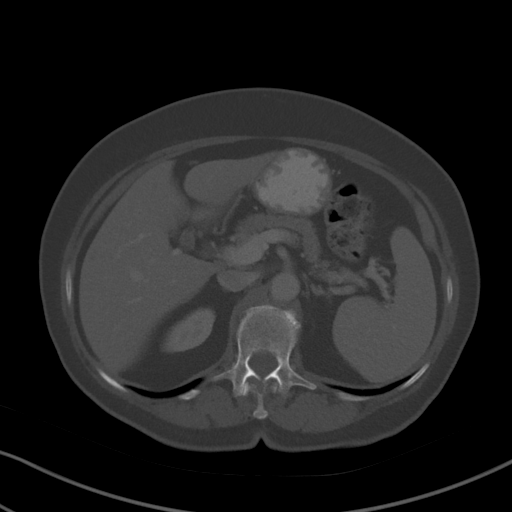
[im 74/96  soft-tissue]
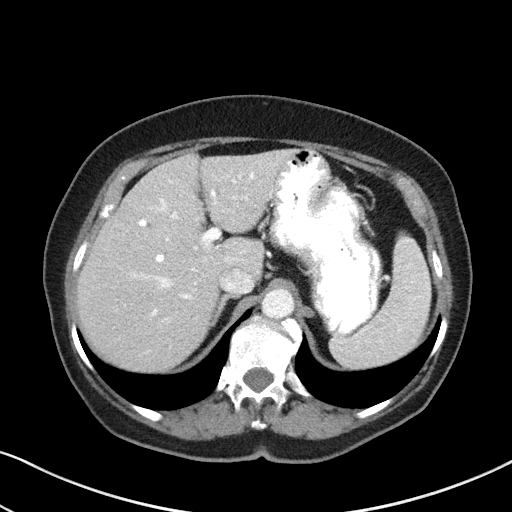
[im 80/96  soft-tissue]
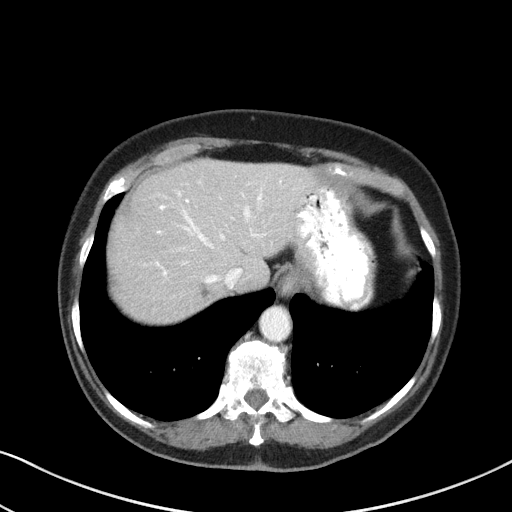
[im 90/96  soft-tissue]
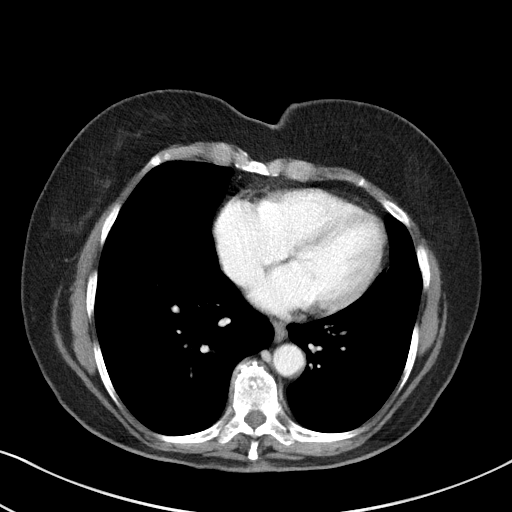

[Series 5: coronal st · coronal · 0.68mm/px · 3 of 81 slices shown]
[im 27/81  soft-tissue]
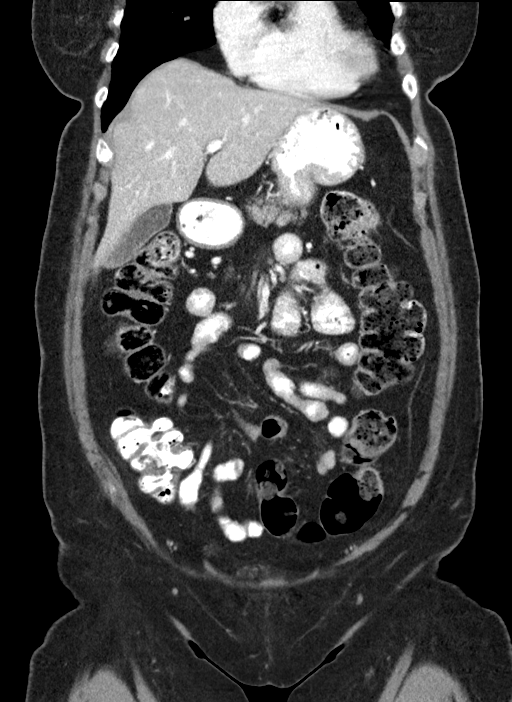
[im 36/81  soft-tissue]
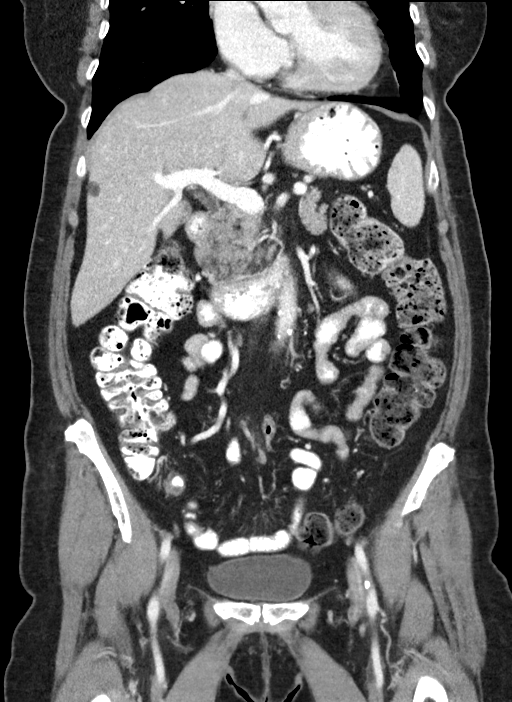
[im 45/81  soft-tissue]
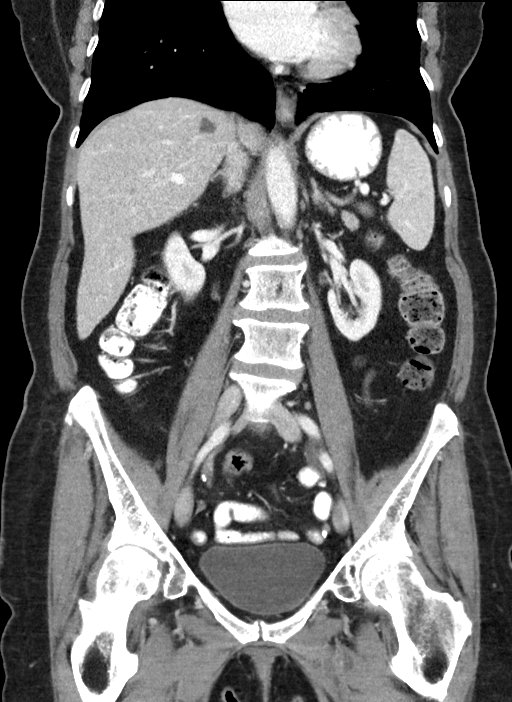

[15 of 46 positions shown; findings below may reference images not displayed]

RADIATION DOSE REDUCTION: This exam was performed according to the
departmental dose-optimization program which includes automated
exposure control, adjustment of the mA and/or kV according to
patient size and/or use of iterative reconstruction technique.

CONTRAST:  80mL OMNIPAQUE IOHEXOL 300 MG/ML  SOLN
FINDINGS: Lower chest: No acute abnormality.

Hepatobiliary: Similar-appearing to slightly increased in size
chronic scattered hepatic hypodensities (seen as far back as [PG]).
Otherwise no new focal liver abnormality. No gallstones, gallbladder
wall thickening, or pericholecystic fluid. No biliary dilatation.

Pancreas: No focal lesion. Normal pancreatic contour. No surrounding
inflammatory changes. No main pancreatic ductal dilatation.

Spleen: Normal in size without focal abnormality.

Adrenals/Urinary Tract:

No adrenal nodule bilaterally.

Bilateral kidneys enhance symmetrically.

No hydronephrosis. No hydroureter.

The urinary bladder is unremarkable.

On delayed imaging, there is no urothelial wall thickening and there
are no filling defects in the opacified portions of the bilateral
collecting systems or ureters.

Stomach/Bowel: Stomach is within normal limits. No evidence of bowel
wall thickening or dilatation. Stool throughout the colon. Appendix
appears normal.

Vascular/Lymphatic: No abdominal aorta or iliac aneurysm. Mild
atherosclerotic plaque of the aorta and its branches. No abdominal,
pelvic, or inguinal lymphadenopathy.

Reproductive: Uterus and bilateral adnexa are unremarkable.

Other: No intraperitoneal free fluid. No intraperitoneal free gas.
No organized fluid collection.

Musculoskeletal:

No abdominal wall hernia or abnormality.

No suspicious lytic or blastic osseous lesions. No acute displaced
fracture.
IMPRESSION: 1. Constipation.
2. Otherwise no acute intra-abdominal or intrapelvic abnormality.

## 2021-07-06 MED ORDER — IOHEXOL 300 MG/ML  SOLN
80.0000 mL | Freq: Once | INTRAMUSCULAR | Status: AC | PRN
  Administered 2021-07-06: 80 mL via INTRAVENOUS

## 2021-07-30 ENCOUNTER — Encounter: Payer: Self-pay | Admitting: Oncology

## 2021-07-30 ENCOUNTER — Inpatient Hospital Stay (HOSPITAL_BASED_OUTPATIENT_CLINIC_OR_DEPARTMENT_OTHER): Payer: Medicare HMO | Admitting: Oncology

## 2021-07-30 ENCOUNTER — Inpatient Hospital Stay: Payer: Medicare HMO | Attending: Oncology

## 2021-07-30 VITALS — BP 134/76 | HR 74 | Temp 97.3°F | Resp 16 | Ht 64.0 in | Wt 144.5 lb

## 2021-07-30 DIAGNOSIS — Z5111 Encounter for antineoplastic chemotherapy: Secondary | ICD-10-CM

## 2021-07-30 DIAGNOSIS — R197 Diarrhea, unspecified: Secondary | ICD-10-CM | POA: Diagnosis not present

## 2021-07-30 DIAGNOSIS — D471 Chronic myeloproliferative disease: Secondary | ICD-10-CM

## 2021-07-30 DIAGNOSIS — D75839 Thrombocytosis, unspecified: Secondary | ICD-10-CM | POA: Diagnosis not present

## 2021-07-30 DIAGNOSIS — Z79899 Other long term (current) drug therapy: Secondary | ICD-10-CM | POA: Insufficient documentation

## 2021-07-30 DIAGNOSIS — D473 Essential (hemorrhagic) thrombocythemia: Secondary | ICD-10-CM | POA: Diagnosis not present

## 2021-07-30 LAB — COMPREHENSIVE METABOLIC PANEL
ALT: 16 U/L (ref 0–44)
AST: 22 U/L (ref 15–41)
Albumin: 3.6 g/dL (ref 3.5–5.0)
Alkaline Phosphatase: 126 U/L (ref 38–126)
Anion gap: 6 (ref 5–15)
BUN: 14 mg/dL (ref 8–23)
CO2: 27 mmol/L (ref 22–32)
Calcium: 8.5 mg/dL — ABNORMAL LOW (ref 8.9–10.3)
Chloride: 103 mmol/L (ref 98–111)
Creatinine, Ser: 1.04 mg/dL — ABNORMAL HIGH (ref 0.44–1.00)
GFR, Estimated: 56 mL/min — ABNORMAL LOW (ref >60)
Glucose, Bld: 120 mg/dL — ABNORMAL HIGH (ref 70–99)
Potassium: 4.2 mmol/L (ref 3.5–5.1)
Sodium: 136 mmol/L (ref 135–145)
Total Bilirubin: 1.1 mg/dL (ref 0.3–1.2)
Total Protein: 6.6 g/dL (ref 6.5–8.1)

## 2021-07-30 LAB — CBC WITH DIFFERENTIAL/PLATELET
Abs Immature Granulocytes: 0.03 10*3/uL (ref 0.00–0.07)
Basophils Absolute: 0.1 10*3/uL (ref 0.0–0.1)
Basophils Relative: 1 %
Eosinophils Absolute: 0.3 10*3/uL (ref 0.0–0.5)
Eosinophils Relative: 4 %
HCT: 42.5 % (ref 36.0–46.0)
Hemoglobin: 13.9 g/dL (ref 12.0–15.0)
Immature Granulocytes: 0 %
Lymphocytes Relative: 30 %
Lymphs Abs: 2.1 10*3/uL (ref 0.7–4.0)
MCH: 35.7 pg — ABNORMAL HIGH (ref 26.0–34.0)
MCHC: 32.7 g/dL (ref 30.0–36.0)
MCV: 109.3 fL — ABNORMAL HIGH (ref 80.0–100.0)
Monocytes Absolute: 0.5 10*3/uL (ref 0.1–1.0)
Monocytes Relative: 7 %
Neutro Abs: 3.9 10*3/uL (ref 1.7–7.7)
Neutrophils Relative %: 58 %
Platelets: 461 10*3/uL — ABNORMAL HIGH (ref 150–400)
RBC: 3.89 MIL/uL (ref 3.87–5.11)
RDW: 13.6 % (ref 11.5–15.5)
WBC: 6.8 10*3/uL (ref 4.0–10.5)
nRBC: 0 % (ref 0.0–0.2)

## 2021-07-30 MED ORDER — HYDROXYUREA 500 MG PO CAPS: 500.0000 mg | ORAL_CAPSULE | Freq: Every day | ORAL | 1 refills | Status: DC

## 2021-07-30 NOTE — Progress Notes (Signed)
?Hematology/Oncology Progress note ?[phone] [phone] ?  ? ? ? ?Patient Care Team: ?Jearld Fenton, NP as PCP - General (Internal Medicine) ? ?REFERRING PROVIDER: ?Jearld Fenton, NP ? ?CHIEF COMPLAINTS/REASON FOR VISIT:  ?Essential thrombocythemia ? ?HISTORY OF PRESENTING ILLNESS:  ?Tiffany Richardson is a 76 y.o. female who was seen in consultation at the request of Baity, Coralie Keens, NP for evaluation of thromcbocytosis ?Reviewed patient's labs.  ?08/07/2020 labs showed elevated platelet counts at 904,000.  ?Normal wbc  and hemoglobin  ? ?Reviewed patient's previous labs. Thrombocytosis onset is chronic onset , duration since at least 2017 ?No aggravating or elevated factors. ?Associated symptoms or signs:  ?Denies weight loss, fever, chills, fatigue, night sweats.  ?Context:  ?Smoking history: Denies ?Family history of polycythemia.  Denies.  However his father had a history of needing frequent blood removal ?History of iron deficiency anemia: Denies ?History of DVT: Denies ?She also reports chronic abdominal bloating.  She has IBS.  Denies any unintentional weight loss, fever, nausea vomiting.  No recent colonoscopy in current EMR. ?She is widowed, husband had colon cancer. ? ?# Jak 2 V61F mutation positive ?08/21/2020 bone marrow biopsy results showed hypercellular bone marrow for age with features of myeloproliferative neoplasm.  Consistent with essential thrombocythemia, iron deficient polycythemia vera, pretty fibrotic/early primary myelofibrosis. ?Patient has no erythrocytosis.  No chronic leukocytosis.  Clinically most consistent with essential thrombocythemia. ? ?# Chronic bloating and abdominal discomfort, possible due to chronic IBS symptoms.  Last colonoscopy 2013. ?Patient was seen by Dr. Vira Agar in 2019 and was recommended to increase soluble fiber, MiraLAX, if not helpful trial of FODMAP diet and probiotics.  Discussed with patient that I recommend surveillance colonoscopy.   ? ?INTERVAL HISTORY ?[name] is a 76 y.o. female who has above history reviewed by me today presents for follow up visit for management of essential thrombocythemia ?Patient has been on hydroxyurea 500 mg daily.  Patient tolerates well.  No new complaints ?Marland Kitchen  She recently had a CT scan done obtained by primary care provider for evaluation of diarrhea, abdominal discomfort.  07/06/2021, CT scan showed constipation.  Otherwise no acute intra-a patient bdominal or intrapelvic abnormality. ?Reports alternate of constipation and diarrhea.  Not associated with any food or medication. ?Review of Systems  ?Constitutional:  Negative for appetite change, chills, fatigue and fever.  ?HENT:   Negative for hearing loss and voice change.   ?Eyes:  Negative for eye problems.  ?Respiratory:  Negative for chest tightness and cough.   ?Cardiovascular:  Negative for chest pain.  ?Gastrointestinal:  Positive for diarrhea. Negative for abdominal distention, abdominal pain and blood in stool.  ?Endocrine: Negative for hot flashes.  ?Genitourinary:  Negative for difficulty urinating and frequency.   ?Musculoskeletal:  Negative for arthralgias.  ?Skin:  Negative for itching and rash.  ?Neurological:  Negative for extremity weakness.  ?Hematological:  Negative for adenopathy.  ?Psychiatric/Behavioral:  Negative for confusion.   ? ?MEDICAL HISTORY:  ?Past Medical History:  ?Diagnosis Date  ? Allergy   ? Cancer [facility])   ? basal cell  ? Emphysema of lung ([place])   ? Hypertension   ? ? ?SURGICAL HISTORY: ?Past Surgical History:  ?Procedure Laterality Date  ? basal cell removed    ? Rt eye  ? squamous cell removed     ? ? ?SOCIAL HISTORY: ?Social History  ? ?Socioeconomic History  ? Marital status: Widowed  ?  Spouse name: Not on file  ? Number of  children: Not on file  ? Years of education: Not on file  ? Highest education level: Not on file  ?Occupational History  ? Not on file  ?Tobacco Use  ? Smoking status: Never  ? Smokeless tobacco:  Never  ?Vaping Use  ? Vaping Use: Never used  ?Substance and Sexual Activity  ? Alcohol use: No  ?  Alcohol/week: 0.0 standard drinks  ? Drug use: No  ? Sexual activity: Not on file  ?Other Topics Concern  ? Not on file  ?Social History Narrative  ? Not on file  ? ?Social Determinants of Health  ? ?Financial Resource Strain: Not on file  ?Food Insecurity: Not on file  ?Transportation Needs: Not on file  ?Physical Activity: Not on file  ?Stress: Not on file  ?Social Connections: Not on file  ?Intimate Partner Violence: Not on file  ? ? ?FAMILY HISTORY: ?Family History  ?Problem Relation Age of Onset  ? Leukemia Mother   ? Heart disease Father   ? Non-Hodgkin's lymphoma Daughter   ? ? ?ALLERGIES:  is allergic to codeine. ? ?MEDICATIONS:  ?Current Outpatient Medications  ?Medication Sig Dispense Refill  ? aspirin EC 81 MG tablet Take 81 mg by mouth.    ? atorvastatin (LIPITOR) 20 MG tablet Take 1 tablet (20 mg total) by mouth daily. 90 tablet 3  ? fluticasone (FLONASE) 50 MCG/ACT nasal spray SPRAY 2 SPRAYS INTO EACH NOSTRIL EVERY DAY 48 mL 1  ? metoprolol succinate (TOPROL-XL) 50 MG 24 hr tablet TAKE 1 TABLET BY MOUTH EVERY DAY WITH OR IMMEDIATELY FOLLOWING A MEAL 90 tablet 0  ? metroNIDAZOLE (METROCREAM) 0.75 % cream SMARTSIG:Sparingly Topical 1-2 Times Daily    ? pantoprazole (PROTONIX) 40 MG tablet TAKE 1 TABLET BY MOUTH EVERY DAY 90 tablet 0  ? hydroxyurea (HYDREA) 500 MG capsule Take 1 capsule (500 mg total) by mouth daily. 90 capsule 1  ? ?No current facility-administered medications for this visit.  ? ? ? ?PHYSICAL EXAMINATION: ?ECOG PERFORMANCE STATUS: 0 - Asymptomatic ?Vitals:  ? 07/30/21 1338  ?BP: 134/76  ?Pulse: 74  ?Resp: 16  ?Temp: (!) 97.3 ?F (36.3 ?C)  ?SpO2: 100%  ? ?Filed Weights  ? 07/30/21 1338  ?Weight: 144 lb 8 oz (65.5 kg)  ? ? ?Physical Exam ?Constitutional:   ?   General: She is not in acute distress. ?HENT:  ?   Head: Normocephalic and atraumatic.  ?Eyes:  ?   General: No scleral  icterus. ?Cardiovascular:  ?   Rate and Rhythm: Normal rate and regular rhythm.  ?   Heart sounds: Normal heart sounds.  ?Pulmonary:  ?   Effort: Pulmonary effort is normal. No respiratory distress.  ?   Breath sounds: No wheezing.  ?Abdominal:  ?   General: Bowel sounds are normal. There is no distension.  ?   Palpations: Abdomen is soft.  ?Musculoskeletal:     ?   General: No deformity. Normal range of motion.  ?   Cervical back: Normal range of motion and neck supple.  ?Skin: ?   General: Skin is warm and dry.  ?   Findings: No erythema or rash.  ?Neurological:  ?   Mental Status: She is alert and oriented to person, place, and time. Mental status is at baseline.  ?   Cranial Nerves: No cranial nerve deficit.  ?   Coordination: Coordination normal.  ?Psychiatric:     ?   Mood and Affect: Mood normal.  ? ? ? ?LABORATORY  DATA:  ?I have reviewed the data as listed ?Lab Results  ?Component Value Date  ? WBC 6.8 07/30/2021  ? HGB 13.9 07/30/2021  ? HCT 42.5 07/30/2021  ? MCV 109.3 (H) 07/30/2021  ? PLT 461 (H) 07/30/2021  ? ?Recent Labs  ?  08/07/20 ?1001 09/11/20 ?0840 05/07/21 ?1403 06/18/21 ?1108 07/30/21 ?1322  ?NA 142   < > 136 138 136  ?K 5.3   < > 3.8 4.4 4.2  ?CL 105   < > 103 103 103  ?CO2 29   < > '28 30 27  ' ?GLUCOSE 88   < > 101* 98 120*  ?BUN 15   < > '17 16 14  ' ?CREATININE 0.90   < > 0.85 1.01* 1.04*  ?CALCIUM 9.2   < > 8.6* 8.7* 8.5*  ?GFRNONAA 63   < > >60 58* 56*  ?GFRAA 74  --   --   --   --   ?PROT 6.7   < > 7.0 7.0 6.6  ?ALBUMIN  --    < > 3.7 3.7 3.6  ?AST 16   < > '16 20 22  ' ?ALT 10   < > '14 17 16  ' ?ALKPHOS  --    < > 138* 126 126  ?BILITOT 0.7   < > 0.8 0.6 1.1  ? < > = values in this interval not displayed.  ? ? ?Iron/TIBC/Ferritin/ %Sat ?   ?Component Value Date/Time  ? IRON 85 08/15/2020 1003  ? IRON 60 05/29/2015 1404  ? TIBC 337 08/15/2020 1003  ? TIBC 237 (L) 05/29/2015 1404  ? FERRITIN 31 08/15/2020 1003  ? FERRITIN 136 05/29/2015 1404  ? IRONPCTSAT 25 08/15/2020 1003  ? IRONPCTSAT 24  12/11/2015 1436  ?  ? ? ?RADIOGRAPHIC STUDIES: ?I have personally reviewed the radiological images as listed and agreed with the findings in the report. ?CT Abdomen Pelvis W Contrast ? ?Result Date: 07/08/2021 ?CLINICAL DATA:  [place]

## 2021-08-03 ENCOUNTER — Ambulatory Visit: Payer: Self-pay

## 2021-08-03 NOTE — Telephone Encounter (Signed)
She has to be seen before medication will be sent to the pharmacy ?

## 2021-08-03 NOTE — Telephone Encounter (Signed)
Apt 08/06/2021 at 11:20 ? ?Thanks,  ? ?-Mickel Baas  ?

## 2021-08-03 NOTE — Telephone Encounter (Signed)
?  Chief Complaint: abdominal pain  ?Symptoms: abdominal pain, cramping has became constant ?Frequency: 2 days ?Pertinent Negatives: Patient denies diarrhea or vomiting ?Disposition: '[]'$ ED /'[]'$ Urgent Care (no appt availability in office) / '[]'$ Appointment(In office/virtual)/ '[]'$  [place] [facility]/ '[]'$ Home Care/ '[x]'$ Refused Recommended Disposition /'[]'$ [place] [facility]/ '[]'$  Follow-up with PCP ?Additional Notes: pt didn't want to schedule appt for OV or VV. She states she is working today until 8pm and is needing something to help with the pain. She was told by her oncologist to f/up with PCP for possible gastritis. I advised her I would send message to [place], NP and have someone f/up with her as far as sending medication into pharmacy. Pt verbalized understanding.  ? ?Summary: advice - stomach issues  ? Pt called in wanting something sent in for her stomach issues, pt stated she thinks she has gastritis and wanted something sent in to help, pt did not want to schedule an appt or come in to be seen for her complaints FYI   ?  ? ?Reason for Disposition ? [1] MILD-MODERATE pain AND [2] constant AND [3] present > 2 hours ? ?Answer Assessment - Initial Assessment Questions ?1. LOCATION: "Where does it hurt?"  ?    Mid section  ?3. ONSET: "When did the pain begin?" (e.g., minutes, hours or days ago)  ?    2 days  ?5. PATTERN "Does the pain come and go, or is it constant?" ?   - If constant: "Is it getting better, staying the same, or worsening?"  ?    (Note: Constant means the pain never goes away completely; most serious pain is constant and it progresses)  ?   - If intermittent: "How long does it last?" "Do you have pain now?" ?    (Note: Intermittent means the pain goes away completely between bouts) ?    Constant  ?6. SEVERITY: "How bad is the pain?"  (e.g., Scale 1-10; mild, moderate, or severe) ?  - MILD (1-3): doesn't interfere with normal activities, abdomen soft and not tender to touch  ?  - MODERATE (4-7):  interferes with normal activities or awakens from sleep, abdomen tender to touch  ?  - SEVERE (8-10): excruciating pain, doubled over, unable to do any normal activities  ?    6 ?10. OTHER SYMPTOMS: "Do you have any other symptoms?" (e.g., back pain, diarrhea, fever, urination pain, vomiting) ?      No ? ?Protocols used: Abdominal Pain - Female-A-AH ? ?

## 2021-08-06 ENCOUNTER — Ambulatory Visit (INDEPENDENT_AMBULATORY_CARE_PROVIDER_SITE_OTHER): Payer: Medicare Other | Admitting: Internal Medicine

## 2021-08-06 ENCOUNTER — Encounter: Payer: Self-pay | Admitting: Internal Medicine

## 2021-08-06 VITALS — BP 130/68 | HR 69 | Temp 97.1°F | Wt 144.0 lb

## 2021-08-06 DIAGNOSIS — Z1231 Encounter for screening mammogram for malignant neoplasm of breast: Secondary | ICD-10-CM

## 2021-08-06 DIAGNOSIS — K582 Mixed irritable bowel syndrome: Secondary | ICD-10-CM

## 2021-08-06 DIAGNOSIS — R140 Abdominal distension (gaseous): Secondary | ICD-10-CM

## 2021-08-06 DIAGNOSIS — R1012 Left upper quadrant pain: Secondary | ICD-10-CM | POA: Diagnosis not present

## 2021-08-06 NOTE — Progress Notes (Signed)
? ?Subjective:  ? ? Patient ID: Tiffany Richardson, female    DOB: 09/20/1945, 76 y.o.   MRN: [id number] ? ?HPI ? ?Patient presents to the clinic today for follow-up of IBS.  She has been having a lot of abdominal pain, bloating, alternating constipation and diarrhea.  She describes the pain as sensation of fullness. She has a history of Pantoprazole as prescribed. CT abdomen/pelvis from 06/2021 showed: ? ?IMPRESSION: ?1. Constipation. ?2. Otherwise no acute intra-abdominal or intrapelvic abnormality. ? ?She currently only takes Imodium OTC as needed. ? ?Review of Systems ? ?   ?Past Medical History:  ?Diagnosis Date  ? Allergy   ? Cancer [facility])   ? basal cell  ? Emphysema of lung ([place])   ? Hypertension   ? ? ?Current Outpatient Medications  ?Medication Sig Dispense Refill  ? aspirin EC 81 MG tablet Take 81 mg by mouth.    ? atorvastatin (LIPITOR) 20 MG tablet Take 1 tablet (20 mg total) by mouth daily. 90 tablet 3  ? fluticasone (FLONASE) 50 MCG/ACT nasal spray SPRAY 2 SPRAYS INTO EACH NOSTRIL EVERY DAY 48 mL 1  ? hydroxyurea (HYDREA) 500 MG capsule Take 1 capsule (500 mg total) by mouth daily. 90 capsule 1  ? metoprolol succinate (TOPROL-XL) 50 MG 24 hr tablet TAKE 1 TABLET BY MOUTH EVERY DAY WITH OR IMMEDIATELY FOLLOWING A MEAL 90 tablet 0  ? metroNIDAZOLE (METROCREAM) 0.75 % cream SMARTSIG:Sparingly Topical 1-2 Times Daily    ? pantoprazole (PROTONIX) 40 MG tablet TAKE 1 TABLET BY MOUTH EVERY DAY 90 tablet 0  ? ?No current facility-administered medications for this visit.  ? ? ?Allergies  ?Allergen Reactions  ? Codeine Swelling  ? ? ?Family History  ?Problem Relation Age of Onset  ? Leukemia Mother   ? Heart disease Father   ? Non-Hodgkin's lymphoma Daughter   ? ? ?Social History  ? ?Socioeconomic History  ? Marital status: Widowed  ?  Spouse name: Not on file  ? Number of children: Not on file  ? Years of education: Not on file  ? Highest education level: Not on file  ?Occupational History  ? Not on file  ?Tobacco Use   ? Smoking status: Never  ? Smokeless tobacco: Never  ?Vaping Use  ? Vaping Use: Never used  ?Substance and Sexual Activity  ? Alcohol use: No  ?  Alcohol/week: 0.0 standard drinks  ? Drug use: No  ? Sexual activity: Not on file  ?Other Topics Concern  ? Not on file  ?Social History Narrative  ? Not on file  ? ?Social Determinants of Health  ? ?Financial Resource Strain: Not on file  ?Food Insecurity: Not on file  ?Transportation Needs: Not on file  ?Physical Activity: Not on file  ?Stress: Not on file  ?Social Connections: Not on file  ?Intimate Partner Violence: Not on file  ? ? ? ?Constitutional: Denies fever, malaise, fatigue, headache or abrupt weight changes.  ?Respiratory: Denies difficulty breathing, shortness of breath, cough or sputum production.   ?Cardiovascular: Denies chest pain, chest tightness, palpitations or swelling in the hands or feet.  ?Gastrointestinal: Patient reports abdominal pain, bloating, constipation and diarrhea.  Denies bloating, or blood in the stool.  ?GU: Denies urgency, frequency, pain with urination, burning sensation, blood in urine, odor or discharge. ? ?No other specific complaints in a complete review of systems (except as listed in HPI above). ? ?Objective:  ? Physical Exam ? ?BP 130/68 (BP Location: Left Arm, Patient Position:  Sitting, Cuff Size: Large)   Pulse 69   Temp (!) 97.1 ?F (36.2 ?C) (Temporal)   Wt 144 lb (65.3 kg)   SpO2 98%   BMI 24.72 kg/m?  ? ?Wt Readings from Last 3 Encounters:  ?07/30/21 144 lb 8 oz (65.5 kg)  ?07/04/21 141 lb (64 kg)  ?05/07/21 141 lb 1.6 oz (64 kg)  ? ? ?General: Appears her stated age, well developed, well nourished in NAD. ?Cardiovascular: Normal rate and rhythm.  ?Pulmonary/Chest: Normal effort and positive vesicular breath sounds. No respiratory distress. No wheezes, rales or ronchi noted.  ?Abdomen: Soft and nontender. Normal bowel sounds. No distention or masses noted. ?Musculoskeletal: No difficulty with gait.  ?Neurological:  Alert and oriented.  ?BMET ?   ?Component Value Date/Time  ? NA 136 07/30/2021 1322  ? NA 142 10/11/2016 0932  ? K 4.2 07/30/2021 1322  ? CL 103 07/30/2021 1322  ? CO2 27 07/30/2021 1322  ? GLUCOSE 120 (H) 07/30/2021 1322  ? BUN 14 07/30/2021 1322  ? BUN 12 10/11/2016 0932  ? CREATININE 1.04 (H) 07/30/2021 1322  ? CREATININE 0.90 04/02/2021 0840  ? CALCIUM 8.5 (L) 07/30/2021 1322  ? GFRNONAA 56 (L) 07/30/2021 1322  ? GFRNONAA 63 08/07/2020 1001  ? GFRAA 74 08/07/2020 1001  ? ? ?Lipid Panel  ?   ?Component Value Date/Time  ? CHOL 142 04/02/2021 0840  ? CHOL 220 (H) 10/11/2016 0932  ? TRIG 250 (H) 04/02/2021 0840  ? HDL 35 (L) 04/02/2021 0840  ? HDL 42 10/11/2016 0932  ? CHOLHDL 4.1 04/02/2021 0840  ? Sophia 74 04/02/2021 0840  ? ? ?CBC ?   ?Component Value Date/Time  ? WBC 6.8 07/30/2021 1322  ? RBC 3.89 07/30/2021 1322  ? HGB 13.9 07/30/2021 1322  ? HGB 13.0 05/29/2015 1404  ? HCT 42.5 07/30/2021 1322  ? HCT 39.0 05/29/2015 1404  ? PLT 461 (H) 07/30/2021 1322  ? PLT 684 (H) 05/29/2015 1404  ? MCV 109.3 (H) 07/30/2021 1322  ? MCV 90 05/29/2015 1404  ? MCH 35.7 (H) 07/30/2021 1322  ? MCHC 32.7 07/30/2021 1322  ? RDW 13.6 07/30/2021 1322  ? RDW 13.6 05/29/2015 1404  ? LYMPHSABS 2.1 07/30/2021 1322  ? LYMPHSABS 3.5 (H) 05/29/2015 1404  ? MONOABS 0.5 07/30/2021 1322  ? EOSABS 0.3 07/30/2021 1322  ? EOSABS 0.3 05/29/2015 1404  ? BASOSABS 0.1 07/30/2021 1322  ? BASOSABS 0.1 05/29/2015 1404  ? ? ?Hgb A1C ?No results found for: HGBA1C ? ? ? ? ? ? ?   ?Assessment & Plan:  ?Abdominal Pain, bBoating, IBS with Alternating Constipation and Diarrhea: ? ?Persistent issues ?CT reviewed with patient ?We will have her hold pantoprazole x2 weeks ?We will have her schedule for future celiac panel and H. pylori breath test in 2 weeks ?If labs unremarkable, will refer back to GI for further evaluation ?Discussed starting possible SSRI like sertraline for IBS symptoms but she declines at this time ? ?RTC in 2 months for follow-up of  chronic conditions ?Webb Silversmith, NP ? ?

## 2021-08-06 NOTE — Patient Instructions (Addendum)
Diet for Irritable Bowel Syndrome ?When you have irritable bowel syndrome (IBS), it is very important to follow the eating habits that are best for your condition. IBS may cause various symptoms, such as pain in the abdomen, constipation, or diarrhea. ?Choosing the right foods can help to ease the discomfort from these symptoms. Work with your health care provider and dietitian to find the eating plan that will help to control your symptoms. ?What are tips for following this plan? ?A person writing in a diary. ? ?Keep a food diary. This will help you identify foods that cause symptoms. Write down: ?What you eat and when you eat it. ?What symptoms you have. ?When symptoms occur in relation to your meals, such as "pain in abdomen 2 hours after dinner." ?Eat your meals slowly and in a relaxed setting. ?Aim to eat 5-6 small meals per day. Do not skip meals. ?Drink enough fluid to keep your urine pale yellow. ?Ask your health care provider if you should take an over-the-counter probiotic to help restore healthy bacteria in your gut (digestive tract). Probiotics are foods that contain good bacteria and yeasts. ?Your dietitian may have specific dietary recommendations for you based on your symptoms. Your dietitian may recommend that you: ?Avoid foods that cause symptoms. Talk with your dietitian about other ways to get the same nutrients that are in those problem foods. ?Avoid foods with gluten. Gluten is a protein that is found in rye, wheat, and barley. ?Eat more foods that contain soluble fiber. Examples of foods with high soluble fiber include oats, seeds, and certain fruits and vegetables. Take a fiber supplement if told by your dietitian. ?Reduce or avoid certain foods called FODMAPs. These are foods that contain sugars that are hard for some people to digest. Ask your health care provider which foods to avoid. ?What foods should I avoid? ?A sign showing that a person should not drink alcohol. ? ?The following are  some foods and drinks that may make your symptoms worse: ?Fatty foods, such as french fries. ?Foods that contain gluten, such as pasta and cereal. ?Dairy products, such as milk, cheese, and ice cream. ?Spicy foods. ?Alcohol. ?Products with caffeine, such as coffee, tea, or chocolate. ?Carbonated drinks, such as soda. ?Foods that are high in FODMAPs. These include certain fruits and vegetables. ?Products with sweeteners such as honey, high fructose corn syrup, sorbitol, and mannitol. ?The items listed above may not be a complete list of foods and beverages you should avoid. Contact a dietitian for more information. ?What foods are good sources of fiber? ?A bowl of oatmeal. ? ?Your health care provider or dietitian may recommend that you eat more foods that contain fiber. Fiber can help to reduce constipation and other IBS symptoms. Add foods with fiber to your diet a little at a time so your body can get used to them. Too much fiber at one time might cause gas and swelling of your abdomen. The following are some foods that are good sources of fiber: ?Berries, such as raspberries, strawberries, and blueberries. ?Tomatoes. ?Carrots. ?Brown rice. ?Oats. ?Seeds, such as chia and pumpkin seeds. ?The items listed above may not be a complete list of recommended sources of fiber. Contact your dietitian for more options. ?Where to find more information ?International Foundation for Functional Gastrointestinal Disorders: aboutibs.org ?[employer] of Diabetes and Digestive and Kidney Diseases: AmenCredit.is ?Summary ?When you have irritable bowel syndrome (IBS), it is very important to follow the eating habits that are best for your condition. ?IBS  may cause various symptoms, such as pain in the abdomen, constipation, or diarrhea. ?Choosing the right foods can help to ease the discomfort that comes from symptoms. ?Your health care provider or dietitian may recommend that you eat more foods that contain fiber. ?Keep a  food diary. This will help you identify foods that cause symptoms. ?This information is not intended to replace advice given to you by your health care provider. Make sure you discuss any questions you have with your health care provider. ?Document Revised: 03/20/2021 Document Reviewed: 03/20/2021 ?Elsevier Patient Education ? [place]. ?   ? ? ?

## 2021-08-11 ENCOUNTER — Other Ambulatory Visit: Payer: Self-pay | Admitting: Internal Medicine

## 2021-08-11 DIAGNOSIS — I10 Essential (primary) hypertension: Secondary | ICD-10-CM

## 2021-08-12 ENCOUNTER — Other Ambulatory Visit: Payer: Self-pay | Admitting: Internal Medicine

## 2021-08-13 NOTE — Telephone Encounter (Signed)
Requested Prescriptions  ?Pending Prescriptions Disp Refills  ?? pantoprazole (PROTONIX) 40 MG tablet [Pharmacy Med Name: PANTOPRAZOLE SOD DR 40 MG TAB] 90 tablet 0  ?  Sig: TAKE 1 TABLET BY MOUTH EVERY DAY  ?  ? Gastroenterology: Proton Pump Inhibitors Passed - 08/12/2021 10:45 AM  ?  ?  Passed - Valid encounter within last 12 months  ?  Recent Outpatient Visits   ?      ? 1 week ago Irritable bowel syndrome with both constipation and diarrhea  ? [facility] [place], [place] W, NP  ? 1 month ago Diarrhea, unspecified type  ? [facility] [place], Coralie Keens, NP  ? 4 months ago Medicare annual wellness visit, subsequent  ? [facility] [place], [place] W, NP  ? 10 months ago Primary hypertension  ? [facility] [place], [place] W, NP  ? 1 year ago Irritable bowel syndrome with diarrhea  ? [place], DO  ?  ?  ? ?  ?  ?  ? ? ?

## 2021-08-13 NOTE — Telephone Encounter (Signed)
Requested Prescriptions  ?Pending Prescriptions Disp Refills  ?? metoprolol succinate (TOPROL-XL) 50 MG 24 hr tablet [Pharmacy Med Name: METOPROLOL SUCC ER 50 MG TAB] 90 tablet 0  ?  Sig: TAKE 1 TABLET BY MOUTH EVERY DAY WITH OR IMMEDIATELY FOLLOWING A MEAL  ?  ? Cardiovascular:  Beta Blockers Passed - 08/11/2021 10:03 AM  ?  ?  Passed - Last BP in normal range  ?  BP Readings from Last 1 Encounters:  ?08/06/21 130/68  ?   ?  ?  Passed - Last Heart Rate in normal range  ?  Pulse Readings from Last 1 Encounters:  ?08/06/21 69  ?   ?  ?  Passed - Valid encounter within last 6 months  ?  Recent Outpatient Visits   ?      ? 1 week ago Irritable bowel syndrome with both constipation and diarrhea  ? [facility] [place], [place] W, NP  ? 1 month ago Diarrhea, unspecified type  ? [facility] [place] [employer], Coralie Keens, NP  ? 4 months ago Medicare annual wellness visit, subsequent  ? [facility] [place], [place] W, NP  ? 10 months ago Primary hypertension  ? [facility] [place], [place] W, NP  ? 1 year ago Irritable bowel syndrome with diarrhea  ? [place], DO  ?  ?  ? ?  ?  ?  ? ? ?

## 2021-08-20 ENCOUNTER — Ambulatory Visit: Payer: Self-pay | Admitting: *Deleted

## 2021-08-20 NOTE — Telephone Encounter (Signed)
Pt requests call back as she has questions about taking her medications and having labs drawn. Cb# [phone]  ? ? ? ? ? ?Chief Complaint: LAB appt ?Symptoms: NA ?Frequency: NA ?Pertinent Negatives: Patient denies NA ?Disposition: '[]'$ ED /'[]'$ Urgent Care (no appt availability in office) / '[]'$ Appointment(In office/virtual)/ '[]'$  [facility]/ '[]'$ Home Care/ '[]'$ Refused Recommended Disposition /'[]'$ [place] [facility]/ '[x]'$  Follow-up with PCP ?Additional Notes: Pt states she was to stop acid reflux med for 2 weeks then come in for labs. States today was 2 weeks but lab appt is not until next Monday 08/27/21. Pt questioning if this was in error? States she cannot be without her med for another week. ?Please advise. ?Reason for Disposition ? [1] Caller requesting NON-URGENT health information AND [2] PCP's office is the best resource ? ?Answer Assessment - Initial Assessment Questions ?1. REASON FOR CALL or QUESTION: "What is your reason for calling today?" or "How can I best help you?" or "What question do you have that I can help answer?" ?    Lab appt error? ? ?Protocols used: Information Only Call - No Triage-A-AH ? ?

## 2021-08-21 NOTE — Telephone Encounter (Signed)
Pt advised.  She is going to come by 08/22/2021 at 8:15.  ? ?Thanks,  ? ?-Mickel Baas  ?

## 2021-08-21 NOTE — Telephone Encounter (Signed)
She can come to the lab and have the H. pylori drawn today ?

## 2021-08-22 ENCOUNTER — Other Ambulatory Visit: Payer: Medicare Other

## 2021-08-22 DIAGNOSIS — K582 Mixed irritable bowel syndrome: Secondary | ICD-10-CM

## 2021-08-23 LAB — H. PYLORI BREATH TEST: H. pylori Breath Test: NOT DETECTED

## 2021-08-23 LAB — CELIAC DISEASE PANEL
(tTG) Ab, IgA: <1 U/mL
(tTG) Ab, IgG: <1 U/mL
Gliadin IgA: <1 U/mL
Gliadin IgG: <1 U/mL
Immunoglobulin A: 406 mg/dL — ABNORMAL HIGH (ref 70–320)

## 2021-08-27 ENCOUNTER — Other Ambulatory Visit: Payer: Medicare Other

## 2021-09-01 ENCOUNTER — Other Ambulatory Visit: Payer: Self-pay | Admitting: Internal Medicine

## 2021-09-01 DIAGNOSIS — I10 Essential (primary) hypertension: Secondary | ICD-10-CM

## 2021-09-01 DIAGNOSIS — E782 Mixed hyperlipidemia: Secondary | ICD-10-CM

## 2021-09-04 NOTE — Telephone Encounter (Signed)
Too soon to refill. ? Atorvastatin MCN:OBSJGG to last until end of June ?Metformin:enough to last until end of July ?Pantoprazole: enough refill yo last enf of July ?Requested Prescriptions  ?Refused Prescriptions Disp Refills  ?? atorvastatin (LIPITOR) 20 MG tablet [Pharmacy Med Name: ATORVASTATIN 20 MG TABLET] 90 tablet 3  ?  Sig: TAKE 1 TABLET BY MOUTH EVERY DAY  ?  ? Cardiovascular:  Antilipid - Statins Failed - 09/01/2021  9:39 AM  ?  ?  Failed - Lipid Panel in normal range within the last 12 months  ?  Cholesterol, Total  ?Date Value Ref Range Status  ?10/11/2016 220 (H) 100 - 199 mg/dL Final  ? ?Cholesterol  ?Date Value Ref Range Status  ?04/02/2021 142 <200 mg/dL Final  ? ?LDL Cholesterol (Calc)  ?Date Value Ref Range Status  ?04/02/2021 74 mg/dL (calc) Final  ?  Comment:  ?  Reference range: <100 ?Marland Kitchen ?Desirable range <100 mg/dL for primary prevention;   ?<70 mg/dL for patients with CHD or diabetic patients  ?with > or = 2 CHD risk factors. ?. ?LDL-C is now calculated using the Martin-Hopkins  ?calculation, which is a validated novel method providing  ?better accuracy than the Friedewald equation in the  ?estimation of LDL-C.  ?Cresenciano Genre et al. Annamaria Helling. 8366;294(76): 2061-2068  ?(http://education.QuestDiagnostics.com/faq/FAQ164) ?  ? ?HDL  ?Date Value Ref Range Status  ?04/02/2021 35 (L) > OR = 50 mg/dL Final  ?10/11/2016 42 >39 mg/dL Final  ? ?Triglycerides  ?Date Value Ref Range Status  ?04/02/2021 250 (H) <150 mg/dL Final  ?  Comment:  ?  . ?If a non-fasting specimen was collected, consider ?repeat triglyceride testing on a fasting specimen ?if clinically indicated.  ?Garald Balding al. J. of Clin. Lipidol. 5465;0:354-656. ?. ?  ? ?  ?  ?  Passed - Patient is not pregnant  ?  ?  Passed - Valid encounter within last 12 months  ?  Recent Outpatient Visits   ?      ? 4 weeks ago Irritable bowel syndrome with both constipation and diarrhea  ? [facility] [place], [place] W, NP  ? 2 months ago Diarrhea,  unspecified type  ? [facility] [place], Coralie Keens, NP  ? 5 months ago Medicare annual wellness visit, subsequent  ? [facility] [place], Coralie Keens, NP  ? 11 months ago Primary hypertension  ? [facility] [place], [place] W, NP  ? 1 year ago Irritable bowel syndrome with diarrhea  ? [place], DO  ?  ?  ? ?  ?  ?  ?? metoprolol succinate (TOPROL-XL) 50 MG 24 hr tablet [Pharmacy Med Name: METOPROLOL SUCC ER 50 MG TAB] 90 tablet 0  ?  Sig: TAKE 1 TABLET BY MOUTH EVERY DAY WITH OR IMMEDIATELY FOLLOWING A MEAL  ?  ? Cardiovascular:  Beta Blockers Passed - 09/01/2021  9:39 AM  ?  ?  Passed - Last BP in normal range  ?  BP Readings from Last 1 Encounters:  ?08/06/21 130/68  ?   ?  ?  Passed - Last Heart Rate in normal range  ?  Pulse Readings from Last 1 Encounters:  ?08/06/21 69  ?   ?  ?  Passed - Valid encounter within last 6 months  ?  Recent Outpatient Visits   ?      ? 4 weeks ago Irritable bowel syndrome with both constipation and diarrhea  ?  [facility] [place], [place] W, NP  ? 2 months ago Diarrhea, unspecified type  ? [facility] [place], Coralie Keens, NP  ? 5 months ago Medicare annual wellness visit, subsequent  ? [facility] [place], Coralie Keens, NP  ? 11 months ago Primary hypertension  ? [facility] [place], [place] W, NP  ? 1 year ago Irritable bowel syndrome with diarrhea  ? [facility] [place], Devonne Doughty, DO  ?  ?  ? ?  ?  ?  ?? pantoprazole (PROTONIX) 40 MG tablet [Pharmacy Med Name: PANTOPRAZOLE SOD DR 40 MG TAB] 90 tablet 0  ?  Sig: TAKE 1 TABLET BY MOUTH EVERY DAY  ?  ? Gastroenterology: Proton Pump Inhibitors Passed - 09/01/2021  9:39 AM  ?  ?  Passed - Valid encounter within last 12 months  ?  Recent Outpatient Visits   ?      ? 4 weeks ago Irritable bowel syndrome with both constipation and diarrhea  ? [facility] [place], [place] W, NP   ? 2 months ago Diarrhea, unspecified type  ? [facility] [place], Coralie Keens, NP  ? 5 months ago Medicare annual wellness visit, subsequent  ? [facility] [place], Coralie Keens, NP  ? 11 months ago Primary hypertension  ? [facility] [place], [place] W, NP  ? 1 year ago Irritable bowel syndrome with diarrhea  ? [place], DO  ?  ?  ? ?  ?  ?  ? ? ?

## 2021-09-05 ENCOUNTER — Telehealth: Payer: Self-pay | Admitting: Internal Medicine

## 2021-09-06 NOTE — Telephone Encounter (Addendum)
[facility] called and spoke to Tiffany Richardson, [facility] about the refill(s) Pantoprazole requested. Advised it was sent on 08/13/21 #90/0 refill(s). She says it was picked up on 08/14/21 after 8pm. I called and advised the patient, she says she doesn't remember it being in the bag with the Metoprolol that she picked up and once she's back home, she will look at the bag and see if it was supposed to be in there or not. I advised if she still has the bag with the printout stapled to the bag with the medications and pantoprazole is on the printout to take the [facility] and [facility] to [facility] to show them what you received. Advised if it's refilled again insurance will not pay and she will have to pay out of pocket. Patient verbalized understanding.

## 2021-09-06 NOTE — Telephone Encounter (Signed)
Patient called and advised [facility]. She says she left there yesterday and was told there were no refills available. I advised I will call when the pharmacy opens from lunch to verify if they received and if not, will be sending in a refill today. Patient verbalized understanding and says she will receive a notification of the refill being ready from [facility].

## 2021-09-06 NOTE — Telephone Encounter (Signed)
Pt called back reporting that she is completely out of her current supply, requesting urgent refill

## 2021-09-06 NOTE — Telephone Encounter (Signed)
Medication was refused, due to request being too soon. Last refill 08/13/21 for 90. Will refuse duplicate medication request.  Requested Prescriptions  Pending Prescriptions Disp Refills  . pantoprazole (PROTONIX) 40 MG tablet [Pharmacy Med Name: PANTOPRAZOLE SOD DR 40 MG TAB] 90 tablet 0    Sig: TAKE 1 TABLET BY MOUTH EVERY DAY     Gastroenterology: Proton Pump Inhibitors Passed - 09/05/2021  8:13 PM      Passed - Valid encounter within last 12 months    Recent Outpatient Visits          1 month ago Irritable bowel syndrome with both constipation and diarrhea   [facility] [place], Coralie Keens, NP   2 months ago Diarrhea, unspecified type   [place], Coralie Keens, NP   5 months ago Medicare annual wellness visit, subsequent   [facility] [place], Coralie Keens, NP   11 months ago Primary hypertension   [facility] [place], Coralie Keens, NP   1 year ago Irritable bowel syndrome with diarrhea   [place], DO

## 2021-10-18 ENCOUNTER — Other Ambulatory Visit: Payer: Self-pay | Admitting: Internal Medicine

## 2021-10-18 DIAGNOSIS — E782 Mixed hyperlipidemia: Secondary | ICD-10-CM

## 2021-10-18 NOTE — Telephone Encounter (Signed)
Requested medication (s) are due for refill today:   Yes  Requested medication (s) are on the active medication list:   Yes  Future visit scheduled:   No   Last ordered: 10/18/2020 #90, 3 refills  Returned because a DX Code is needed.     Requested Prescriptions  Pending Prescriptions Disp Refills   atorvastatin (LIPITOR) 20 MG tablet [Pharmacy Med Name: ATORVASTATIN 20 MG TABLET] 90 tablet 3    Sig: TAKE 1 TABLET BY MOUTH EVERY DAY     Cardiovascular:  Antilipid - Statins Failed - 10/18/2021  3:21 PM      Failed - Lipid Panel in normal range within the last 12 months    Cholesterol, Total  Date Value Ref Range Status  10/11/2016 220 (H) 100 - 199 mg/dL Final   Cholesterol  Date Value Ref Range Status  04/02/2021 142 <200 mg/dL Final   LDL Cholesterol (Calc)  Date Value Ref Range Status  04/02/2021 74 mg/dL (calc) Final    Comment:    Reference range: <100 . Desirable range <100 mg/dL for primary prevention;   <70 mg/dL for patients with CHD or diabetic patients  with > or = 2 CHD risk factors. Marland Kitchen LDL-C is now calculated using the Martin-Hopkins  calculation, which is a validated novel method providing  better accuracy than the Friedewald equation in the  estimation of LDL-C.  Cresenciano Genre et al. Annamaria Helling. 7412;878(67): 2061-2068  (http://education.QuestDiagnostics.com/faq/FAQ164)    HDL  Date Value Ref Range Status  04/02/2021 35 (L) > OR = 50 mg/dL Final  10/11/2016 42 >39 mg/dL Final   Triglycerides  Date Value Ref Range Status  04/02/2021 250 (H) <150 mg/dL Final    Comment:    . If a non-fasting specimen was collected, consider repeat triglyceride testing on a fasting specimen if clinically indicated.  Yates Decamp et al. J. of Clin. Lipidol. 6720;9:470-962. Marland Kitchen          Passed - Patient is not pregnant      Passed - Valid encounter within last 12 months    Recent Outpatient Visits           2 months ago Irritable bowel syndrome with both constipation and  diarrhea   [place], NP   3 months ago Diarrhea, unspecified type   [place], NP   6 months ago Medicare annual wellness visit, subsequent   [facility] [place], Coralie Keens, NP   1 year ago Primary hypertension   [facility] [place], Coralie Keens, NP   1 year ago Irritable bowel syndrome with diarrhea   [place], [place]

## 2021-10-29 ENCOUNTER — Inpatient Hospital Stay: Payer: Medicare HMO | Attending: Oncology

## 2021-10-29 ENCOUNTER — Encounter: Payer: Self-pay | Admitting: Oncology

## 2021-10-29 ENCOUNTER — Inpatient Hospital Stay (HOSPITAL_BASED_OUTPATIENT_CLINIC_OR_DEPARTMENT_OTHER): Payer: Medicare HMO | Admitting: Oncology

## 2021-10-29 VITALS — BP 137/77 | HR 67 | Temp 97.2°F | Wt 145.0 lb

## 2021-10-29 DIAGNOSIS — D473 Essential (hemorrhagic) thrombocythemia: Secondary | ICD-10-CM | POA: Insufficient documentation

## 2021-10-29 DIAGNOSIS — Z5111 Encounter for antineoplastic chemotherapy: Secondary | ICD-10-CM | POA: Diagnosis not present

## 2021-10-29 DIAGNOSIS — K588 Other irritable bowel syndrome: Secondary | ICD-10-CM

## 2021-10-29 DIAGNOSIS — Z79899 Other long term (current) drug therapy: Secondary | ICD-10-CM | POA: Insufficient documentation

## 2021-10-29 LAB — COMPREHENSIVE METABOLIC PANEL
ALT: 17 U/L (ref 0–44)
AST: 22 U/L (ref 15–41)
Albumin: 3.7 g/dL (ref 3.5–5.0)
Alkaline Phosphatase: 121 U/L (ref 38–126)
Anion gap: 6 (ref 5–15)
BUN: 13 mg/dL (ref 8–23)
CO2: 27 mmol/L (ref 22–32)
Calcium: 8.7 mg/dL — ABNORMAL LOW (ref 8.9–10.3)
Chloride: 101 mmol/L (ref 98–111)
Creatinine, Ser: 0.94 mg/dL (ref 0.44–1.00)
GFR, Estimated: >60 mL/min (ref >60)
Glucose, Bld: 87 mg/dL (ref 70–99)
Potassium: 4.7 mmol/L (ref 3.5–5.1)
Sodium: 134 mmol/L — ABNORMAL LOW (ref 135–145)
Total Bilirubin: 1 mg/dL (ref 0.3–1.2)
Total Protein: 7 g/dL (ref 6.5–8.1)

## 2021-10-29 LAB — CBC WITH DIFFERENTIAL/PLATELET
Abs Immature Granulocytes: 0.02 10*3/uL (ref 0.00–0.07)
Basophils Absolute: 0.1 10*3/uL (ref 0.0–0.1)
Basophils Relative: 1 %
Eosinophils Absolute: 0.2 10*3/uL (ref 0.0–0.5)
Eosinophils Relative: 2 %
HCT: 43.6 % (ref 36.0–46.0)
Hemoglobin: 14.3 g/dL (ref 12.0–15.0)
Immature Granulocytes: 0 %
Lymphocytes Relative: 33 %
Lymphs Abs: 2.4 10*3/uL (ref 0.7–4.0)
MCH: 35.3 pg — ABNORMAL HIGH (ref 26.0–34.0)
MCHC: 32.8 g/dL (ref 30.0–36.0)
MCV: 107.7 fL — ABNORMAL HIGH (ref 80.0–100.0)
Monocytes Absolute: 0.6 10*3/uL (ref 0.1–1.0)
Monocytes Relative: 8 %
Neutro Abs: 4 10*3/uL (ref 1.7–7.7)
Neutrophils Relative %: 56 %
Platelets: 531 10*3/uL — ABNORMAL HIGH (ref 150–400)
RBC: 4.05 MIL/uL (ref 3.87–5.11)
RDW: 13.4 % (ref 11.5–15.5)
WBC: 7.2 10*3/uL (ref 4.0–10.5)
nRBC: 0 % (ref 0.0–0.2)

## 2021-10-29 NOTE — Progress Notes (Signed)
Hematology/Oncology Progress note [phone] [phone]      Patient Care Team: Jearld Fenton, NP as PCP - General (Internal Medicine)  REFERRING PROVIDER: Jearld Fenton, NP  CHIEF COMPLAINTS/REASON FOR VISIT:  Essential thrombocythemia  HISTORY OF PRESENTING ILLNESS:  Tiffany Richardson is a 76 y.o. female who was seen in consultation at the request of Baity, Coralie Keens, NP for evaluation of thromcbocytosis Reviewed patient's labs.  08/07/2020 labs showed elevated platelet counts at 904,000.  Normal wbc  and hemoglobin   Reviewed patient's previous labs. Thrombocytosis onset is chronic onset , duration since at least 2017 No aggravating or elevated factors. Associated symptoms or signs:  Denies weight loss, fever, chills, fatigue, night sweats.  Context:  Smoking history: Denies Family history of polycythemia.  Denies.  However his father had a history of needing frequent blood removal History of iron deficiency anemia: Denies History of DVT: Denies She also reports chronic abdominal bloating.  She has IBS.  Denies any unintentional weight loss, fever, nausea vomiting.  No recent colonoscopy in current EMR. She is widowed, husband had colon cancer.  # Jak 2 V61F mutation positive 08/21/2020 bone marrow biopsy results showed hypercellular bone marrow for age with features of myeloproliferative neoplasm.  Consistent with essential thrombocythemia, iron deficient polycythemia vera, pretty fibrotic/early primary myelofibrosis. Patient has no erythrocytosis.  No chronic leukocytosis.  Clinically most consistent with essential thrombocythemia.  # Chronic bloating and abdominal discomfort, possible due to chronic IBS symptoms.  Last colonoscopy 2013. Patient was seen by Dr. Vira Agar in 2019 and was recommended to increase soluble fiber, MiraLAX, if not helpful trial of FODMAP diet and probiotics.  Discussed with patient that I recommend surveillance colonoscopy.   07/06/2021, CT scan showed constipation.  Otherwise no acute intra-a patient bdominal or intrapelvic abnormality. Reports alternate of constipation and diarrhea.  Not associated with any food or medication.  INTERVAL HISTORY Tiffany Richardson is a 76 y.o. female who has above history reviewed by me today presents for follow up visit for management of essential thrombocythemia Patient has been on hydroxyurea 500 mg daily.  Patient tolerates well.   Review of Systems  Constitutional:  Negative for appetite change, chills, fatigue and fever.  HENT:   Negative for hearing loss and voice change.   Eyes:  Negative for eye problems.  Respiratory:  Negative for chest tightness and cough.   Cardiovascular:  Negative for chest pain.  Gastrointestinal:  Negative for abdominal distention, abdominal pain and blood in stool.  Endocrine: Negative for hot flashes.  Genitourinary:  Negative for difficulty urinating and frequency.   Musculoskeletal:  Negative for arthralgias.  Skin:  Negative for itching and rash.  Neurological:  Negative for extremity weakness.  Hematological:  Negative for adenopathy.  Psychiatric/Behavioral:  Negative for confusion.     MEDICAL HISTORY:  Past Medical History:  Diagnosis Date   Allergy    Cancer ([place])    basal cell   Emphysema of lung ([place])    Hypertension     SURGICAL HISTORY: Past Surgical History:  Procedure Laterality Date   basal cell removed     Rt eye   squamous cell removed       SOCIAL HISTORY: Social History   Socioeconomic History   Marital status: Widowed    Spouse name: Not on file   Number of children: Not on file   Years of education: Not on file   Highest education level: Not on file  Occupational History  Not on file  Tobacco Use   Smoking status: Never   Smokeless tobacco: Never  Vaping Use   Vaping Use: Never used  Substance and Sexual Activity   Alcohol use: No    Alcohol/week: 0.0 standard drinks of alcohol   Drug use:  No   Sexual activity: Not on file  Other Topics Concern   Not on file  Social History Narrative   Not on file   Social Determinants of Health   Financial Resource Strain: Not on file  Food Insecurity: Not on file  Transportation Needs: Not on file  Physical Activity: Not on file  Stress: Not on file  Social Connections: Not on file  Intimate Partner Violence: Not on file    FAMILY HISTORY: Family History  Problem Relation Age of Onset   Leukemia Mother    Heart disease Father    Non-Hodgkin's lymphoma Daughter     ALLERGIES:  is allergic to codeine.  MEDICATIONS:  Current Outpatient Medications  Medication Sig Dispense Refill   aspirin EC 81 MG tablet Take 81 mg by mouth.     atorvastatin (LIPITOR) 20 MG tablet Take 1 tablet (20 mg total) by mouth daily. 90 tablet 3   fluticasone (FLONASE) 50 MCG/ACT nasal spray SPRAY 2 SPRAYS INTO EACH NOSTRIL EVERY DAY 48 mL 1   hydroxyurea (HYDREA) 500 MG capsule Take 1 capsule (500 mg total) by mouth daily. 90 capsule 1   metoprolol succinate (TOPROL-XL) 50 MG 24 hr tablet TAKE 1 TABLET BY MOUTH EVERY DAY WITH OR IMMEDIATELY FOLLOWING A MEAL 90 tablet 0   metroNIDAZOLE (METROCREAM) 0.75 % cream SMARTSIG:Sparingly Topical 1-2 Times Daily     pantoprazole (PROTONIX) 40 MG tablet TAKE 1 TABLET BY MOUTH EVERY DAY 90 tablet 0   No current facility-administered medications for this visit.     PHYSICAL EXAMINATION: ECOG PERFORMANCE STATUS: 0 - Asymptomatic Vitals:   10/29/21 1337  BP: 137/77  Pulse: 67  Temp: (!) 97.2 F (36.2 C)   Filed Weights   10/29/21 1337  Weight: 145 lb (65.8 kg)    Physical Exam Constitutional:      General: She is not in acute distress. HENT:     Head: Normocephalic and atraumatic.  Eyes:     General: No scleral icterus. Cardiovascular:     Rate and Rhythm: Normal rate and regular rhythm.     Heart sounds: Normal heart sounds.  Pulmonary:     Effort: Pulmonary effort is normal. No respiratory  distress.     Breath sounds: No wheezing.  Abdominal:     General: Bowel sounds are normal. There is no distension.     Palpations: Abdomen is soft.  Musculoskeletal:        General: No deformity. Normal range of motion.     Cervical back: Normal range of motion and neck supple.  Skin:    General: Skin is warm and dry.     Findings: No erythema or rash.  Neurological:     Mental Status: She is alert and oriented to person, place, and time. Mental status is at baseline.     Cranial Nerves: No cranial nerve deficit.     Coordination: Coordination normal.  Psychiatric:        Mood and Affect: Mood normal.      LABORATORY DATA:  I have reviewed the data as listed    Latest Ref Rng & Units 10/29/2021    1:28 PM 07/30/2021    1:22 PM 06/18/2021  11:08 AM  CBC  WBC 4.0 - 10.5 K/uL 7.2  6.8  7.0   Hemoglobin 12.0 - 15.0 g/dL 14.3  13.9  14.3   Hematocrit 36.0 - 46.0 % 43.6  42.5  43.2   Platelets 150 - 400 K/uL 531  461  454       Latest Ref Rng & Units 10/29/2021    1:28 PM 07/30/2021    1:22 PM 06/18/2021   11:08 AM  CMP  Glucose 70 - 99 mg/dL 87  120  98   BUN 8 - 23 mg/dL '13  14  16   ' Creatinine 0.44 - 1.00 mg/dL 0.94  1.04  1.01   Sodium 135 - 145 mmol/L 134  136  138   Potassium 3.5 - 5.1 mmol/L 4.7  4.2  4.4   Chloride 98 - 111 mmol/L 101  103  103   CO2 22 - 32 mmol/L '27  27  30   ' Calcium 8.9 - 10.3 mg/dL 8.7  8.5  8.7   Total Protein 6.5 - 8.1 g/dL 7.0  6.6  7.0   Total Bilirubin 0.3 - 1.2 mg/dL 1.0  1.1  0.6   Alkaline Phos 38 - 126 U/L 121  126  126   AST 15 - 41 U/L '22  22  20   ' ALT 0 - 44 U/L '17  16  17      ' Iron/TIBC/Ferritin/ %Sat    Component Value Date/Time   IRON 85 08/15/2020 1003   IRON 60 05/29/2015 1404   TIBC 337 08/15/2020 1003   TIBC 237 (L) 05/29/2015 1404   FERRITIN 31 08/15/2020 1003   FERRITIN 136 05/29/2015 1404   IRONPCTSAT 25 08/15/2020 1003   IRONPCTSAT 24 12/11/2015 1436      RADIOGRAPHIC STUDIES: I have personally reviewed  the radiological images as listed and agreed with the findings in the report. No results found.     ASSESSMENT & PLAN:  1. Essential thrombocythemia ([place])   2. Encounter for antineoplastic chemotherapy   3. Other irritable bowel syndrome    # essential thrombocythemia Labs reviewed and discussed with patient.  Platelet count is 531,000. Slightly elevated comparing to last visit. Discussed with patient that her counts are still relatively stable, I will hold off increasing her regimen.  I recommend continue monitor and her liver progressively increasing, will consider to increase hydroxyurea dosage.  She agrees with the plan.  #Chronic GI GI issues with alternate constipation and diarrhea.  CT did not reveal any acute etiology.  Possible IBS recommend her to continue follow-up with primary care provider.   Repeat cbc in 3 months lab MD   All questions were answered. The patient knows to call the clinic with any problems questions or concerns.  Cc Jearld Fenton, NP  Earlie Server, MD, PhD 10/29/2021

## 2021-11-28 ENCOUNTER — Other Ambulatory Visit: Payer: Self-pay | Admitting: Internal Medicine

## 2021-11-28 DIAGNOSIS — I10 Essential (primary) hypertension: Secondary | ICD-10-CM

## 2021-11-28 DIAGNOSIS — J3089 Other allergic rhinitis: Secondary | ICD-10-CM

## 2021-11-28 DIAGNOSIS — E782 Mixed hyperlipidemia: Secondary | ICD-10-CM

## 2021-11-28 NOTE — Telephone Encounter (Signed)
Requested Prescriptions  Pending Prescriptions Disp Refills  . atorvastatin (LIPITOR) 20 MG tablet [Pharmacy Med Name: ATORVASTATIN 20 MG TABLET] 90 tablet 1    Sig: TAKE 1 TABLET BY MOUTH EVERY DAY     Cardiovascular:  Antilipid - Statins Failed - 11/28/2021  7:30 AM      Failed - Lipid Panel in normal range within the last 12 months    Cholesterol, Total  Date Value Ref Range Status  10/11/2016 220 (H) 100 - 199 mg/dL Final   Cholesterol  Date Value Ref Range Status  04/02/2021 142 <200 mg/dL Final   LDL Cholesterol (Calc)  Date Value Ref Range Status  04/02/2021 74 mg/dL (calc) Final    Comment:    Reference range: <100 . Desirable range <100 mg/dL for primary prevention;   <70 mg/dL for patients with CHD or diabetic patients  with > or = 2 CHD risk factors. Marland Kitchen LDL-C is now calculated using the Martin-Hopkins  calculation, which is a validated novel method providing  better accuracy than the Friedewald equation in the  estimation of LDL-C.  Cresenciano Genre et al. Annamaria Helling. 6606;301(60): 2061-2068  (http://education.[facility].com/faq/FAQ164)    HDL  Date Value Ref Range Status  04/02/2021 35 (L) > OR = 50 mg/dL Final  10/11/2016 42 >39 mg/dL Final   Triglycerides  Date Value Ref Range Status  04/02/2021 250 (H) <150 mg/dL Final    Comment:    . If a non-fasting specimen was collected, consider repeat triglyceride testing on a fasting specimen if clinically indicated.  Yates Decamp et al. J. of Clin. Lipidol. 1093;2:355-732. Marland Kitchen          Passed - Patient is not pregnant      Passed - Valid encounter within last 12 months    Recent Outpatient Visits          3 months ago Irritable bowel syndrome with both constipation and diarrhea   [facility] [place], Coralie Keens, NP   4 months ago Diarrhea, unspecified type   [place], NP   8 months ago Medicare annual wellness visit, subsequent   [facility] [place],  Coralie Keens, NP   1 year ago Primary hypertension   [facility] [place], Coralie Keens, NP   1 year ago Irritable bowel syndrome with diarrhea   [place], DO             . pantoprazole (PROTONIX) 40 MG tablet [Pharmacy Med Name: PANTOPRAZOLE SOD DR 40 MG TAB] 90 tablet 0    Sig: TAKE 1 TABLET BY MOUTH EVERY DAY     Gastroenterology: Proton Pump Inhibitors Passed - 11/28/2021  7:30 AM      Passed - Valid encounter within last 12 months    Recent Outpatient Visits          3 months ago Irritable bowel syndrome with both constipation and diarrhea   [facility] [place], Coralie Keens, NP   4 months ago Diarrhea, unspecified type   [place], NP   8 months ago Medicare annual wellness visit, subsequent   [facility] [place], Coralie Keens, NP   1 year ago Primary hypertension   [facility] [place], Coralie Keens, NP   1 year ago Irritable bowel syndrome with diarrhea   [place], DO             .  fluticasone (FLONASE) 50 MCG/ACT nasal spray [Pharmacy Med Name: FLUTICASONE PROP 50 MCG SPRAY] 48 mL 1    Sig: SPRAY 2 SPRAYS INTO EACH NOSTRIL EVERY DAY     Ear, Nose, and Throat: Nasal Preparations - Corticosteroids Passed - 11/28/2021  7:30 AM      Passed - Valid encounter within last 12 months    Recent Outpatient Visits          3 months ago Irritable bowel syndrome with both constipation and diarrhea   [place], NP   4 months ago Diarrhea, unspecified type   [place], NP   8 months ago Medicare annual wellness visit, subsequent   [facility] [place], Coralie Keens, NP   1 year ago Primary hypertension   [facility] [place], Coralie Keens, NP   1 year ago Irritable bowel syndrome with diarrhea   [place], DO

## 2021-11-28 NOTE — Telephone Encounter (Signed)
Requested Prescriptions  Pending Prescriptions Disp Refills  . metoprolol succinate (TOPROL-XL) 50 MG 24 hr tablet [Pharmacy Med Name: METOPROLOL SUCC ER 50 MG TAB] 90 tablet 0    Sig: TAKE 1 TABLET BY MOUTH EVERY DAY WITH OR IMMEDIATELY FOLLOWING A MEAL     Cardiovascular:  Beta Blockers Passed - 11/28/2021 10:48 AM      Passed - Last BP in normal range    BP Readings from Last 1 Encounters:  10/29/21 137/77         Passed - Last Heart Rate in normal range    Pulse Readings from Last 1 Encounters:  10/29/21 67         Passed - Valid encounter within last 6 months    Recent Outpatient Visits          3 months ago Irritable bowel syndrome with both constipation and diarrhea   [place], NP   4 months ago Diarrhea, unspecified type   [place], NP   8 months ago Medicare annual wellness visit, subsequent   [facility] [place], Coralie Keens, NP   1 year ago Primary hypertension   [facility] [place], Coralie Keens, NP   1 year ago Irritable bowel syndrome with diarrhea   [place], DO

## 2021-12-03 DIAGNOSIS — D2272 Melanocytic nevi of left lower limb, including hip: Secondary | ICD-10-CM | POA: Diagnosis not present

## 2021-12-03 DIAGNOSIS — Z7189 Other specified counseling: Secondary | ICD-10-CM | POA: Diagnosis not present

## 2021-12-03 DIAGNOSIS — L814 Other melanin hyperpigmentation: Secondary | ICD-10-CM | POA: Diagnosis not present

## 2021-12-03 DIAGNOSIS — L570 Actinic keratosis: Secondary | ICD-10-CM | POA: Diagnosis not present

## 2021-12-03 DIAGNOSIS — L298 Other pruritus: Secondary | ICD-10-CM | POA: Diagnosis not present

## 2021-12-03 DIAGNOSIS — L820 Inflamed seborrheic keratosis: Secondary | ICD-10-CM | POA: Diagnosis not present

## 2021-12-03 DIAGNOSIS — L821 Other seborrheic keratosis: Secondary | ICD-10-CM | POA: Diagnosis not present

## 2021-12-03 DIAGNOSIS — L538 Other specified erythematous conditions: Secondary | ICD-10-CM | POA: Diagnosis not present

## 2021-12-03 DIAGNOSIS — X32XXXA Exposure to sunlight, initial encounter: Secondary | ICD-10-CM | POA: Diagnosis not present

## 2021-12-03 DIAGNOSIS — D225 Melanocytic nevi of trunk: Secondary | ICD-10-CM | POA: Diagnosis not present

## 2021-12-03 DIAGNOSIS — D2271 Melanocytic nevi of right lower limb, including hip: Secondary | ICD-10-CM | POA: Diagnosis not present

## 2022-01-28 ENCOUNTER — Ambulatory Visit: Payer: Self-pay | Admitting: *Deleted

## 2022-01-28 ENCOUNTER — Telehealth (INDEPENDENT_AMBULATORY_CARE_PROVIDER_SITE_OTHER): Payer: Medicare Other | Admitting: Internal Medicine

## 2022-01-28 ENCOUNTER — Inpatient Hospital Stay: Payer: Medicare HMO

## 2022-01-28 ENCOUNTER — Telehealth: Payer: Self-pay | Admitting: Oncology

## 2022-01-28 ENCOUNTER — Inpatient Hospital Stay: Payer: Medicare HMO | Admitting: Oncology

## 2022-01-28 ENCOUNTER — Encounter: Payer: Self-pay | Admitting: Internal Medicine

## 2022-01-28 DIAGNOSIS — J019 Acute sinusitis, unspecified: Secondary | ICD-10-CM

## 2022-01-28 DIAGNOSIS — B9689 Other specified bacterial agents as the cause of diseases classified elsewhere: Secondary | ICD-10-CM

## 2022-01-28 MED ORDER — AMOXICILLIN-POT CLAVULANATE 875-125 MG PO TABS: 1.0000 | ORAL_TABLET | Freq: Two times a day (BID) | ORAL | 0 refills | Status: DC

## 2022-01-28 NOTE — Progress Notes (Signed)
Virtual Visit via Video Note  I connected with Tiffany Richardson on 01/28/22 at  4:00 PM EDT by a video enabled telemedicine application and verified that I am speaking with the correct person using two identifiers.  Location: Patient: In her car Provider: Office  Person's participating in this video call: Webb Silversmith, NP and Sissy Hoff   I discussed the limitations of evaluation and management by telemedicine and the availability of in person appointments. The patient expressed understanding and agreed to proceed.  History of Present Illness:  Pt reports facial pain and pressure, nasal congestion, sore throat and cough. This started 1 week ago.  She is blowing yellow mucus out of her nose.  She denies difficulty swallowing.  The cough is dry nonproductive.  She denies headache, runny nose, ear pain, shortness of breath, chest pain, nausea or vomiting.  She denies fever, chills or body aches.  She has not had sick contacts with similar symptoms.  She has tried NyQuil, DayQuil and Flonase OTC with minimal relief of symptoms.  She did have a negative COVID test at home.   Past Medical History:  Diagnosis Date   Allergy    Cancer ([place])    basal cell   Emphysema of lung ([facility])    Hypertension     Current Outpatient Medications  Medication Sig Dispense Refill   aspirin EC 81 MG tablet Take 81 mg by mouth.     atorvastatin (LIPITOR) 20 MG tablet TAKE 1 TABLET BY MOUTH EVERY DAY 90 tablet 1   fluticasone (FLONASE) 50 MCG/ACT nasal spray SPRAY 2 SPRAYS INTO EACH NOSTRIL EVERY DAY 48 mL 1   hydroxyurea (HYDREA) 500 MG capsule Take 1 capsule (500 mg total) by mouth daily. 90 capsule 1   metoprolol succinate (TOPROL-XL) 50 MG 24 hr tablet TAKE 1 TABLET BY MOUTH EVERY DAY WITH OR IMMEDIATELY FOLLOWING A MEAL 90 tablet 0   metroNIDAZOLE (METROCREAM) 0.75 % cream SMARTSIG:Sparingly Topical 1-2 Times Daily     pantoprazole (PROTONIX) 40 MG tablet TAKE 1 TABLET BY MOUTH EVERY DAY 90 tablet 0   No  current facility-administered medications for this visit.    Allergies  Allergen Reactions   Codeine Swelling    Family History  Problem Relation Age of Onset   Leukemia Mother    Heart disease Father    Non-Hodgkin's lymphoma Daughter     Social History   Socioeconomic History   Marital status: Widowed    Spouse name: Not on file   Number of children: Not on file   Years of education: Not on file   Highest education level: Not on file  Occupational History   Not on file  Tobacco Use   Smoking status: Never   Smokeless tobacco: Never  Vaping Use   Vaping Use: Never used  Substance and Sexual Activity   Alcohol use: No    Alcohol/week: 0.0 standard drinks of alcohol   Drug use: No   Sexual activity: Not on file  Other Topics Concern   Not on file  Social History Narrative   Not on file   Social Determinants of Health   Financial Resource Strain: Not on file  Food Insecurity: Not on file  Transportation Needs: Not on file  Physical Activity: Not on file  Stress: Not on file  Social Connections: Not on file  Intimate Partner Violence: Not on file     Constitutional: Denies fever, malaise, fatigue, headache or abrupt weight changes.  HEENT: Pt reports facial  pain and pressure, nasal congestion and sore throat. Denies eye pain, eye redness, ear pain, ringing in the ears, wax buildup, runny nose, bloody nose. Respiratory: Denies difficulty breathing, shortness of breath, cough or sputum production.   Cardiovascular: Denies chest pain, chest tightness, palpitations or swelling in the hands or feet.  Gastrointestinal: Denies abdominal pain, bloating, constipation, diarrhea or blood in the stool.    No other specific complaints in a complete review of systems (except as listed in HPI above).    Observations/Objective:  Wt Readings from Last 3 Encounters:  10/29/21 145 lb (65.8 kg)  08/06/21 144 lb (65.3 kg)  07/30/21 144 lb 8 oz (65.5 kg)    General:  Appears her stated age, well developed, well nourished in NAD. HEENT:  Nose: Congestion noted; Throat/Mouth: Hoarseness noted Pulmonary/Chest: Normal effort. No respiratory distress.  Neurological: Alert and oriented.   BMET    Component Value Date/Time   NA 134 (L) 10/29/2021 1328   NA 142 10/11/2016 0932   K 4.7 10/29/2021 1328   CL 101 10/29/2021 1328   CO2 27 10/29/2021 1328   GLUCOSE 87 10/29/2021 1328   BUN 13 10/29/2021 1328   BUN 12 10/11/2016 0932   CREATININE 0.94 10/29/2021 1328   CREATININE 0.90 04/02/2021 0840   CALCIUM 8.7 (L) 10/29/2021 1328   GFRNONAA >60 10/29/2021 1328   GFRNONAA 63 08/07/2020 1001   GFRAA 74 08/07/2020 1001    Lipid Panel     Component Value Date/Time   CHOL 142 04/02/2021 0840   CHOL 220 (H) 10/11/2016 0932   TRIG 250 (H) 04/02/2021 0840   HDL 35 (L) 04/02/2021 0840   HDL 42 10/11/2016 0932   CHOLHDL 4.1 04/02/2021 0840   LDLCALC 74 04/02/2021 0840    CBC    Component Value Date/Time   WBC 7.2 10/29/2021 1328   RBC 4.05 10/29/2021 1328   HGB 14.3 10/29/2021 1328   HGB 13.0 05/29/2015 1404   HCT 43.6 10/29/2021 1328   HCT 39.0 05/29/2015 1404   PLT 531 (H) 10/29/2021 1328   PLT 684 (H) 05/29/2015 1404   MCV 107.7 (H) 10/29/2021 1328   MCV 90 05/29/2015 1404   MCH 35.3 (H) 10/29/2021 1328   MCHC 32.8 10/29/2021 1328   RDW 13.4 10/29/2021 1328   RDW 13.6 05/29/2015 1404   LYMPHSABS 2.4 10/29/2021 1328   LYMPHSABS 3.5 (H) 05/29/2015 1404   MONOABS 0.6 10/29/2021 1328   EOSABS 0.2 10/29/2021 1328   EOSABS 0.3 05/29/2015 1404   BASOSABS 0.1 10/29/2021 1328   BASOSABS 0.1 05/29/2015 1404    Hgb A1C No results found for: "HGBA1C"      Assessment and Plan:  Acute Bacterial Sinusitis:  Can use a Nettie pot which can be purchased at your local pharmacy Continue Flonase Can add an antihistamine such as Allegra or Zyrtec Rx for Augmentin 875-125 mg twice daily x10 days Rest and fluids  Follow Up Instructions:     I discussed the assessment and treatment plan with the patient. The patient was provided an opportunity to ask questions and all were answered. The patient agreed with the plan and demonstrated an understanding of the instructions.   The patient was advised to call back or seek an in-person evaluation if the symptoms worsen or if the condition fails to improve as anticipated.   Webb Silversmith, NP

## 2022-01-28 NOTE — Telephone Encounter (Signed)
Pt called and rescheduled her appt, she stated that she has a bad cough and can "taste" the infection. She would something sent in to the pharmacy for her cough.

## 2022-01-28 NOTE — Telephone Encounter (Signed)
Summary: cough   Pt called wanting to be seen today.  She has had cough and congestion since last week  there are no appts today and she cant ome in tomorrow  please advise.   CB@ [phone]      Reason for Disposition  SEVERE coughing spells (e.g., whooping sound after coughing, vomiting after coughing)  Answer Assessment - Initial Assessment Questions 1. ONSET: "When did the cough begin?"      1 week 2. SEVERITY: "How bad is the cough today?"      Nasal cough- can taste infection 3. SPUTUM: "Describe the color of your sputum" (none, dry cough; clear, white, yellow, green)     Nasal congestion- yellow 4. HEMOPTYSIS: "Are you coughing up any blood?" If so ask: "How much?" (flecks, streaks, tablespoons, etc.)     no 5. DIFFICULTY BREATHING: "Are you having difficulty breathing?" If Yes, ask: "How bad is it?" (e.g., mild, moderate, severe)    - MILD: No SOB at rest, mild SOB with walking, speaks normally in sentences, can lie down, no retractions, pulse < 100.    - MODERATE: SOB at rest, SOB with minimal exertion and prefers to sit, cannot lie down flat, speaks in phrases, mild retractions, audible wheezing, pulse 100-120.    - SEVERE: Very SOB at rest, speaks in single words, struggling to breathe, sitting hunched forward, retractions, pulse > 120      mild 6. FEVER: "Do you have a fever?" If Yes, ask: "What is your temperature, how was it measured, and when did it start?"     no 7. CARDIAC HISTORY: "Do you have any history of heart disease?" (e.g., heart attack, congestive heart failure)      no 8. LUNG HISTORY: "Do you have any history of lung disease?"  (e.g., pulmonary embolus, asthma, emphysema)     no 9. PE RISK FACTORS: "Do you have a history of blood clots?" (or: recent major surgery, recent prolonged travel, bedridden)     no 10. OTHER SYMPTOMS: "Do you have any other symptoms?" (e.g., runny nose, wheezing, chest pain)       Runny nose 11. PREGNANCY: "Is there any chance  you are pregnant?" "When was your last menstrual period?"         12. TRAVEL: "Have you traveled out of the country in the last month?" (e.g., travel history, exposures)  Protocols used: Cough - Acute Non-Productive-A-AH

## 2022-01-28 NOTE — Telephone Encounter (Signed)
Called pt and no answer. LVM to let her know that she can pick up OTC cough  medication or reach out to PCP for further evaluation for her cough.

## 2022-01-28 NOTE — Telephone Encounter (Signed)
  Chief Complaint: cough Symptoms: dry hacking cough, congestion with drainage in throat irritation Frequency: 1 week Pertinent Negatives: Patient denies wheezing, chest pain Disposition: '[]'$ ED /'[]'$ Urgent Care (no appt availability in office) / '[x]'$ Appointment(In office/virtual)/ '[]'$  [facility]/ '[]'$ Home Care/ '[]'$ Refused Recommended Disposition /'[]'$ [place] [facility] [facility]/ '[]'$  Follow-up with PCP Additional Notes: Patient request appointment- she believes she has sinus infection

## 2022-01-28 NOTE — Patient Instructions (Signed)

## 2022-02-18 ENCOUNTER — Encounter: Payer: Self-pay | Admitting: Oncology

## 2022-02-18 ENCOUNTER — Inpatient Hospital Stay (HOSPITAL_BASED_OUTPATIENT_CLINIC_OR_DEPARTMENT_OTHER): Payer: Medicare Other | Admitting: Oncology

## 2022-02-18 ENCOUNTER — Inpatient Hospital Stay: Payer: Medicare Other | Attending: Oncology

## 2022-02-18 ENCOUNTER — Other Ambulatory Visit: Payer: Self-pay | Admitting: Internal Medicine

## 2022-02-18 ENCOUNTER — Telehealth: Payer: Self-pay | Admitting: *Deleted

## 2022-02-18 VITALS — BP 138/84 | HR 71 | Temp 96.8°F | Wt 146.7 lb

## 2022-02-18 DIAGNOSIS — D7589 Other specified diseases of blood and blood-forming organs: Secondary | ICD-10-CM | POA: Insufficient documentation

## 2022-02-18 DIAGNOSIS — D473 Essential (hemorrhagic) thrombocythemia: Secondary | ICD-10-CM | POA: Diagnosis present

## 2022-02-18 DIAGNOSIS — D75839 Thrombocytosis, unspecified: Secondary | ICD-10-CM | POA: Insufficient documentation

## 2022-02-18 DIAGNOSIS — I10 Essential (primary) hypertension: Secondary | ICD-10-CM

## 2022-02-18 LAB — CBC WITH DIFFERENTIAL/PLATELET
Abs Immature Granulocytes: 0.02 10*3/uL (ref 0.00–0.07)
Basophils Absolute: 0.1 10*3/uL (ref 0.0–0.1)
Basophils Relative: 1 %
Eosinophils Absolute: 0.2 10*3/uL (ref 0.0–0.5)
Eosinophils Relative: 2 %
HCT: 43.2 % (ref 36.0–46.0)
Hemoglobin: 14.4 g/dL (ref 12.0–15.0)
Immature Granulocytes: 0 %
Lymphocytes Relative: 33 %
Lymphs Abs: 2.3 10*3/uL (ref 0.7–4.0)
MCH: 35.9 pg — ABNORMAL HIGH (ref 26.0–34.0)
MCHC: 33.3 g/dL (ref 30.0–36.0)
MCV: 107.7 fL — ABNORMAL HIGH (ref 80.0–100.0)
Monocytes Absolute: 0.5 10*3/uL (ref 0.1–1.0)
Monocytes Relative: 6 %
Neutro Abs: 4 10*3/uL (ref 1.7–7.7)
Neutrophils Relative %: 58 %
Platelets: 557 10*3/uL — ABNORMAL HIGH (ref 150–400)
RBC: 4.01 MIL/uL (ref 3.87–5.11)
RDW: 13.2 % (ref 11.5–15.5)
WBC: 7 10*3/uL (ref 4.0–10.5)
nRBC: 0 % (ref 0.0–0.2)

## 2022-02-18 LAB — COMPREHENSIVE METABOLIC PANEL
ALT: 19 U/L (ref 0–44)
AST: 23 U/L (ref 15–41)
Albumin: 3.8 g/dL (ref 3.5–5.0)
Alkaline Phosphatase: 131 U/L — ABNORMAL HIGH (ref 38–126)
Anion gap: 3 — ABNORMAL LOW (ref 5–15)
BUN: 13 mg/dL (ref 8–23)
CO2: 29 mmol/L (ref 22–32)
Calcium: 8.7 mg/dL — ABNORMAL LOW (ref 8.9–10.3)
Chloride: 104 mmol/L (ref 98–111)
Creatinine, Ser: 0.85 mg/dL (ref 0.44–1.00)
GFR, Estimated: >60 mL/min (ref >60)
Glucose, Bld: 85 mg/dL (ref 70–99)
Potassium: 4.5 mmol/L (ref 3.5–5.1)
Sodium: 136 mmol/L (ref 135–145)
Total Bilirubin: 1 mg/dL (ref 0.3–1.2)
Total Protein: 7.2 g/dL (ref 6.5–8.1)

## 2022-02-18 MED ORDER — HYDROXYUREA 500 MG PO CAPS: 500.0000 mg | ORAL_CAPSULE | ORAL | 0 refills | Status: DC

## 2022-02-18 NOTE — Telephone Encounter (Signed)
Received Cancer Policy form for patient

## 2022-02-18 NOTE — Telephone Encounter (Signed)
Completed and signed by physician. CAll to patient and informed that form is ready, but that she will have to call to get copy of [facility] bill from hospital to send with it. She will ick it up from registration. I gave her number to call patient accounting

## 2022-02-18 NOTE — Assessment & Plan Note (Addendum)
Labs reviewed and discussed with patient.  Platelet count is 557,000. Slightly elevated comparing to last visit. Recommend to increase hydroxyurea to 1000 1 day per week, and '500mg'$  daily the rest of the days.

## 2022-02-18 NOTE — Progress Notes (Signed)
Hematology/Oncology Progress note [phone] [phone]      Patient Care Team: Jearld Fenton, NP as PCP - General (Internal Medicine)  ASSESSMENT & PLAN:   Essential thrombocythemia [facility]) Labs reviewed and discussed with patient.  Platelet count is 557,000. Slightly elevated comparing to last visit. Recommend to increase hydroxyurea to 1000 1 day per week, and 557m daily the rest of the days.    Macrocytosis No anemia. Due to hydroxyurea use.    Orders Placed This Encounter  Procedures   CBC with Differential/Platelet    Standing Status:   Future    Standing Expiration Date:   02/19/2023   CBC with Differential/Platelet    Standing Status:   Future    Standing Expiration Date:   02/19/2023   Comprehensive metabolic panel    Standing Status:   Future    Standing Expiration Date:   02/18/2023   Follow  Repeat labs in 6 weeks Follow up in 3 months.   All questions were answered. The patient knows to call the clinic with any problems, questions or concerns.  ZEarlie Server MD, PhD [facility] 02/18/2022    CHIEF COMPLAINTS/REASON FOR VISIT:  Essential thrombocythemia  HISTORY OF PRESENTING ILLNESS:  Tiffany NAYAKis a 76y.o. female who was seen in consultation at the request of Baity, RCoralie Keens NP for evaluation of thromcbocytosis Reviewed patient's labs.  08/07/2020 labs showed elevated platelet counts at 904,000.  Normal wbc  and hemoglobin   Reviewed patient's previous labs. Thrombocytosis onset is chronic onset , duration since at least 2017 No aggravating or elevated factors. Associated symptoms or signs:  Denies weight loss, fever, chills, fatigue, night sweats.  Context:  Smoking history: Denies Family history of polycythemia.  Denies.  However his father had a history of needing frequent blood removal History of iron deficiency anemia: Denies History of DVT: Denies She also reports chronic abdominal bloating.   She has IBS.  Denies any unintentional weight loss, fever, nausea vomiting.  No recent colonoscopy in current EMR. She is widowed, husband had colon cancer.  # Jak 2 V61F mutation positive 08/21/2020 bone marrow biopsy results showed hypercellular bone marrow for age with features of myeloproliferative neoplasm.  Consistent with essential thrombocythemia, iron deficient polycythemia vera, pretty fibrotic/early primary myelofibrosis. Patient has no erythrocytosis.  No chronic leukocytosis.  Clinically most consistent with essential thrombocythemia.  # Chronic bloating and abdominal discomfort, possible due to chronic IBS symptoms.  Last colonoscopy 2013. Patient was seen by Dr. EVira Agarin 2019 and was recommended to increase soluble fiber, MiraLAX, if not helpful trial of FODMAP diet and probiotics.  Discussed with patient that I recommend surveillance colonoscopy.  07/06/2021, CT scan showed constipation.  Otherwise no acute intra-a patient bdominal or intrapelvic abnormality. Reports alternate of constipation and diarrhea.  Not associated with any food or medication.  INTERVAL HISTORY Tiffany COPPINis a 76y.o. female who has above history reviewed by me today presents for follow up visit for management of essential thrombocythemia Patient has been on hydroxyurea 500 mg daily.  Patient tolerates well. She reports recent sinus infection.URI  2-3 weeks ago.   Review of Systems  Constitutional:  Negative for appetite change, chills, fatigue and fever.  HENT:   Negative for hearing loss and voice change.   Eyes:  Negative for eye problems.  Respiratory:  Negative for chest tightness and cough.   Cardiovascular:  Negative for chest pain.  Gastrointestinal:  Negative for abdominal distention,  abdominal pain and blood in stool.  Endocrine: Negative for hot flashes.  Genitourinary:  Negative for difficulty urinating and frequency.   Musculoskeletal:  Negative for arthralgias.  Skin:  Negative for  itching and rash.  Neurological:  Negative for extremity weakness.  Hematological:  Negative for adenopathy.  Psychiatric/Behavioral:  Negative for confusion.     MEDICAL HISTORY:  Past Medical History:  Diagnosis Date   Allergy    Cancer ([place])    basal cell   Emphysema of lung ([place])    Hypertension     SURGICAL HISTORY: Past Surgical History:  Procedure Laterality Date   basal cell removed     Rt eye   squamous cell removed       SOCIAL HISTORY: Social History   Socioeconomic History   Marital status: Widowed    Spouse name: Not on file   Number of children: Not on file   Years of education: Not on file   Highest education level: Not on file  Occupational History   Not on file  Tobacco Use   Smoking status: Never   Smokeless tobacco: Never  Vaping Use   Vaping Use: Never used  Substance and Sexual Activity   Alcohol use: No    Alcohol/week: 0.0 standard drinks of alcohol   Drug use: No   Sexual activity: Not on file  Other Topics Concern   Not on file  Social History Narrative   Not on file   Social Determinants of Health   Financial Resource Strain: Not on file  Food Insecurity: Not on file  Transportation Needs: Not on file  Physical Activity: Not on file  Stress: Not on file  Social Connections: Not on file  Intimate Partner Violence: Not on file    FAMILY HISTORY: Family History  Problem Relation Age of Onset   Leukemia Mother    Heart disease Father    Non-Hodgkin's lymphoma Daughter     ALLERGIES:  is allergic to codeine.  MEDICATIONS:  Current Outpatient Medications  Medication Sig Dispense Refill   aspirin EC 81 MG tablet Take 81 mg by mouth.     atorvastatin (LIPITOR) 20 MG tablet TAKE 1 TABLET BY MOUTH EVERY DAY 90 tablet 1   fluticasone (FLONASE) 50 MCG/ACT nasal spray SPRAY 2 SPRAYS INTO EACH NOSTRIL EVERY DAY 48 mL 1   metoprolol succinate (TOPROL-XL) 50 MG 24 hr tablet TAKE 1 TABLET BY MOUTH EVERY DAY WITH OR IMMEDIATELY  FOLLOWING A MEAL 90 tablet 0   pantoprazole (PROTONIX) 40 MG tablet TAKE 1 TABLET BY MOUTH EVERY DAY 90 tablet 0   amoxicillin-clavulanate (AUGMENTIN) 875-125 MG tablet Take 1 tablet by mouth 2 (two) times daily. (Patient not taking: Reported on 02/18/2022) 20 tablet 0   hydroxyurea (HYDREA) 500 MG capsule Take 1 capsule (500 mg total) by mouth See admin instructions. Take 1025m on Mondays, and take 5033mdaily for the rest of the week. 94 capsule 0   metroNIDAZOLE (METROCREAM) 0.75 % cream SMARTSIG:Sparingly Topical 1-2 Times Daily (Patient not taking: Reported on 02/18/2022)     No current facility-administered medications for this visit.     PHYSICAL EXAMINATION: ECOG PERFORMANCE STATUS: 0 - Asymptomatic Vitals:   02/18/22 0939  BP: 138/84  Pulse: 71  Temp: (!) 96.8 F (36 C)  SpO2: 100%   Filed Weights   02/18/22 0939  Weight: 146 lb 11.2 oz (66.5 kg)    Physical Exam Constitutional:      General: She is not in acute  distress. HENT:     Head: Normocephalic and atraumatic.  Eyes:     General: No scleral icterus. Cardiovascular:     Rate and Rhythm: Normal rate and regular rhythm.     Heart sounds: Normal heart sounds.  Pulmonary:     Effort: Pulmonary effort is normal. No respiratory distress.     Breath sounds: No wheezing.  Abdominal:     General: Bowel sounds are normal. There is no distension.     Palpations: Abdomen is soft.  Musculoskeletal:        General: No deformity. Normal range of motion.     Cervical back: Normal range of motion and neck supple.  Skin:    General: Skin is warm and dry.     Findings: No erythema or rash.  Neurological:     Mental Status: She is alert and oriented to person, place, and time. Mental status is at baseline.     Cranial Nerves: No cranial nerve deficit.     Coordination: Coordination normal.  Psychiatric:        Mood and Affect: Mood normal.      LABORATORY DATA:  I have reviewed the data as listed    Latest Ref  Rng & Units 02/18/2022    9:28 AM 10/29/2021    1:28 PM 07/30/2021    1:22 PM  CBC  WBC 4.0 - 10.5 K/uL 7.0  7.2  6.8   Hemoglobin 12.0 - 15.0 g/dL 14.4  14.3  13.9   Hematocrit 36.0 - 46.0 % 43.2  43.6  42.5   Platelets 150 - 400 K/uL 557  531  461       Latest Ref Rng & Units 02/18/2022    9:28 AM 10/29/2021    1:28 PM 07/30/2021    1:22 PM  CMP  Glucose 70 - 99 mg/dL 85  87  120   BUN 8 - 23 mg/dL _0 Creatinine 0.44 - 1.00 mg/dL 0.85  0.94  1.04   Sodium 135 - 145 mmol/L 136  134  136   Potassium 3.5 - 5.1 mmol/L 4.5  4.7  4.2   Chloride 98 - 111 mmol/L 104  101  103   CO2 22 - 32 mmol/L _1 Calcium 8.9 - 10.3 mg/dL 8.7  8.7  8.5   Total Protein 6.5 - 8.1 g/dL 7.2  7.0  6.6   Total Bilirubin 0.3 - 1.2 mg/dL 1.0  1.0  1.1   Alkaline Phos 38 - 126 U/L 131  121  126   AST 15 - 41 U/L _2 ALT 0 - 44 U/L _3 Iron/TIBC/Ferritin/ %Sat    Component Value Date/Time   IRON 85 08/15/2020 1003   IRON 60 05/29/2015 1404   TIBC 337 08/15/2020 1003   TIBC 237 (L) 05/29/2015 1404   FERRITIN 31 08/15/2020 1003   FERRITIN 136 05/29/2015 1404   IRONPCTSAT 25 08/15/2020 1003   IRONPCTSAT 24 12/11/2015 1436      RADIOGRAPHIC STUDIES: I have personally reviewed the radiological images as listed and agreed with the findings in the report. No results found.

## 2022-02-18 NOTE — Assessment & Plan Note (Signed)
No anemia. Due to hydroxyurea use.

## 2022-02-19 NOTE — Telephone Encounter (Signed)
Requested Prescriptions  Pending Prescriptions Disp Refills  . metoprolol succinate (TOPROL-XL) 50 MG 24 hr tablet [Pharmacy Med Name: METOPROLOL SUCC ER 50 MG TAB] 90 tablet 0    Sig: TAKE 1 TABLET BY MOUTH EVERY DAY WITH OR IMMEDIATELY FOLLOWING A MEAL     Cardiovascular:  Beta Blockers Passed - 02/18/2022 12:51 PM      Passed - Last BP in normal range    BP Readings from Last 1 Encounters:  02/18/22 138/84         Passed - Last Heart Rate in normal range    Pulse Readings from Last 1 Encounters:  02/18/22 71         Passed - Valid encounter within last 6 months    Recent Outpatient Visits          3 weeks ago Acute bacterial sinusitis   [facility] [place], [place] W, NP   6 months ago Irritable bowel syndrome with both constipation and diarrhea   [facility] [place], Coralie Keens, NP   7 months ago Diarrhea, unspecified type   [facility], Coralie Keens, NP   10 months ago Medicare annual wellness visit, subsequent   [facility] [place], Coralie Keens, NP   1 year ago Primary hypertension   [facility] [place], [place] W, NP             . pantoprazole (PROTONIX) 40 MG tablet [Pharmacy Med Name: PANTOPRAZOLE SOD DR 40 MG TAB] 90 tablet 0    Sig: TAKE 1 TABLET BY MOUTH EVERY DAY     Gastroenterology: Proton Pump Inhibitors Passed - 02/18/2022 12:51 PM      Passed - Valid encounter within last 12 months    Recent Outpatient Visits          3 weeks ago Acute bacterial sinusitis   [facility] [place], [place] W, NP   6 months ago Irritable bowel syndrome with both constipation and diarrhea   [place], NP   7 months ago Diarrhea, unspecified type   [place], NP   10 months ago Medicare annual wellness visit, subsequent   [facility] [place], Coralie Keens, NP   1 year ago Primary hypertension   [facility] [place], Coralie Keens, NP

## 2022-02-22 ENCOUNTER — Other Ambulatory Visit: Payer: Self-pay | Admitting: Internal Medicine

## 2022-02-22 DIAGNOSIS — E782 Mixed hyperlipidemia: Secondary | ICD-10-CM

## 2022-02-22 NOTE — Telephone Encounter (Signed)
Refused atorvastatin 20 mg tablets because requested too soon.

## 2022-03-20 DIAGNOSIS — H5203 Hypermetropia, bilateral: Secondary | ICD-10-CM | POA: Diagnosis not present

## 2022-04-01 ENCOUNTER — Inpatient Hospital Stay: Payer: Medicare HMO | Attending: Oncology

## 2022-04-01 DIAGNOSIS — D473 Essential (hemorrhagic) thrombocythemia: Secondary | ICD-10-CM | POA: Insufficient documentation

## 2022-04-01 LAB — CBC WITH DIFFERENTIAL/PLATELET
Abs Immature Granulocytes: 0.03 10*3/uL (ref 0.00–0.07)
Basophils Absolute: 0.1 10*3/uL (ref 0.0–0.1)
Basophils Relative: 2 %
Eosinophils Absolute: 0.1 10*3/uL (ref 0.0–0.5)
Eosinophils Relative: 2 %
HCT: 40.1 % (ref 36.0–46.0)
Hemoglobin: 13.7 g/dL (ref 12.0–15.0)
Immature Granulocytes: 1 %
Lymphocytes Relative: 34 %
Lymphs Abs: 2.2 10*3/uL (ref 0.7–4.0)
MCH: 36.4 pg — ABNORMAL HIGH (ref 26.0–34.0)
MCHC: 34.2 g/dL (ref 30.0–36.0)
MCV: 106.6 fL — ABNORMAL HIGH (ref 80.0–100.0)
Monocytes Absolute: 0.5 10*3/uL (ref 0.1–1.0)
Monocytes Relative: 7 %
Neutro Abs: 3.6 10*3/uL (ref 1.7–7.7)
Neutrophils Relative %: 54 %
Platelets: 480 10*3/uL — ABNORMAL HIGH (ref 150–400)
RBC: 3.76 MIL/uL — ABNORMAL LOW (ref 3.87–5.11)
RDW: 14.1 % (ref 11.5–15.5)
WBC: 6.5 10*3/uL (ref 4.0–10.5)
nRBC: 0 % (ref 0.0–0.2)

## 2022-04-08 DIAGNOSIS — H25813 Combined forms of age-related cataract, bilateral: Secondary | ICD-10-CM | POA: Diagnosis not present

## 2022-05-19 ENCOUNTER — Other Ambulatory Visit: Payer: Self-pay | Admitting: Internal Medicine

## 2022-05-19 DIAGNOSIS — I10 Essential (primary) hypertension: Secondary | ICD-10-CM

## 2022-05-20 NOTE — Telephone Encounter (Signed)
Requested Prescriptions  Pending Prescriptions Disp Refills   pantoprazole (PROTONIX) 40 MG tablet [Pharmacy Med Name: PANTOPRAZOLE SOD DR 40 MG TAB] 90 tablet 0    Sig: TAKE 1 TABLET BY MOUTH EVERY DAY     Gastroenterology: Proton Pump Inhibitors Passed - 05/19/2022  8:17 AM      Passed - Valid encounter within last 12 months    Recent Outpatient Visits           3 months ago Acute bacterial sinusitis   [facility] [place], [place] W, NP   9 months ago Irritable bowel syndrome with both constipation and diarrhea   [facility] [place], Coralie Keens, NP   10 months ago Diarrhea, unspecified type   [facility] [place], Coralie Keens, NP   1 year ago Medicare annual wellness visit, subsequent   [facility] [place], Coralie Keens, NP   1 year ago Primary hypertension   [facility] [place], [place] W, NP               metoprolol succinate (TOPROL-XL) 50 MG 24 hr tablet [Pharmacy Med Name: METOPROLOL SUCC ER 50 MG TAB] 90 tablet 0    Sig: TAKE 1 TABLET BY MOUTH EVERY DAY WITH OR IMMEDIATELY FOLLOWING A MEAL     Cardiovascular:  Beta Blockers Passed - 05/19/2022  8:17 AM      Passed - Last BP in normal range    BP Readings from Last 1 Encounters:  02/18/22 138/84         Passed - Last Heart Rate in normal range    Pulse Readings from Last 1 Encounters:  02/18/22 71         Passed - Valid encounter within last 6 months    Recent Outpatient Visits           3 months ago Acute bacterial sinusitis   [facility] [place], [place] W, NP   9 months ago Irritable bowel syndrome with both constipation and diarrhea   [facility] [place], Coralie Keens, NP   10 months ago Diarrhea, unspecified type   [facility] [place], Coralie Keens, NP   1 year ago Medicare annual wellness visit, subsequent   [facility] [place], Coralie Keens, NP   1 year ago Primary hypertension   [facility] [place], Coralie Keens, [place]

## 2022-05-21 ENCOUNTER — Encounter: Payer: Self-pay | Admitting: Oncology

## 2022-05-21 ENCOUNTER — Inpatient Hospital Stay: Payer: Medicare HMO | Attending: Oncology

## 2022-05-21 ENCOUNTER — Inpatient Hospital Stay (HOSPITAL_BASED_OUTPATIENT_CLINIC_OR_DEPARTMENT_OTHER): Payer: Medicare HMO | Admitting: Oncology

## 2022-05-21 ENCOUNTER — Other Ambulatory Visit: Payer: Self-pay | Admitting: Internal Medicine

## 2022-05-21 VITALS — BP 143/74 | HR 68 | Temp 97.5°F | Resp 18 | Wt 148.4 lb

## 2022-05-21 DIAGNOSIS — D473 Essential (hemorrhagic) thrombocythemia: Secondary | ICD-10-CM | POA: Insufficient documentation

## 2022-05-21 DIAGNOSIS — D75839 Thrombocytosis, unspecified: Secondary | ICD-10-CM | POA: Diagnosis not present

## 2022-05-21 DIAGNOSIS — I10 Essential (primary) hypertension: Secondary | ICD-10-CM | POA: Insufficient documentation

## 2022-05-21 DIAGNOSIS — D7589 Other specified diseases of blood and blood-forming organs: Secondary | ICD-10-CM

## 2022-05-21 DIAGNOSIS — R109 Unspecified abdominal pain: Secondary | ICD-10-CM | POA: Diagnosis not present

## 2022-05-21 DIAGNOSIS — R1013 Epigastric pain: Secondary | ICD-10-CM

## 2022-05-21 DIAGNOSIS — Z79899 Other long term (current) drug therapy: Secondary | ICD-10-CM | POA: Insufficient documentation

## 2022-05-21 DIAGNOSIS — K219 Gastro-esophageal reflux disease without esophagitis: Secondary | ICD-10-CM | POA: Diagnosis not present

## 2022-05-21 DIAGNOSIS — J3089 Other allergic rhinitis: Secondary | ICD-10-CM

## 2022-05-21 LAB — COMPREHENSIVE METABOLIC PANEL
ALT: 19 U/L (ref 0–44)
AST: 23 U/L (ref 15–41)
Albumin: 3.9 g/dL (ref 3.5–5.0)
Alkaline Phosphatase: 126 U/L (ref 38–126)
Anion gap: 10 (ref 5–15)
BUN: 18 mg/dL (ref 8–23)
CO2: 27 mmol/L (ref 22–32)
Calcium: 8.8 mg/dL — ABNORMAL LOW (ref 8.9–10.3)
Chloride: 101 mmol/L (ref 98–111)
Creatinine, Ser: 0.92 mg/dL (ref 0.44–1.00)
GFR, Estimated: >60 mL/min (ref >60)
Glucose, Bld: 94 mg/dL (ref 70–99)
Potassium: 4.4 mmol/L (ref 3.5–5.1)
Sodium: 138 mmol/L (ref 135–145)
Total Bilirubin: 0.8 mg/dL (ref 0.3–1.2)
Total Protein: 7.2 g/dL (ref 6.5–8.1)

## 2022-05-21 LAB — CBC WITH DIFFERENTIAL/PLATELET
Abs Immature Granulocytes: 0.03 10*3/uL (ref 0.00–0.07)
Basophils Absolute: 0.1 10*3/uL (ref 0.0–0.1)
Basophils Relative: 1 %
Eosinophils Absolute: 0.1 10*3/uL (ref 0.0–0.5)
Eosinophils Relative: 2 %
HCT: 40.8 % (ref 36.0–46.0)
Hemoglobin: 13.8 g/dL (ref 12.0–15.0)
Immature Granulocytes: 0 %
Lymphocytes Relative: 28 %
Lymphs Abs: 2 10*3/uL (ref 0.7–4.0)
MCH: 37.4 pg — ABNORMAL HIGH (ref 26.0–34.0)
MCHC: 33.8 g/dL (ref 30.0–36.0)
MCV: 110.6 fL — ABNORMAL HIGH (ref 80.0–100.0)
Monocytes Absolute: 0.4 10*3/uL (ref 0.1–1.0)
Monocytes Relative: 6 %
Neutro Abs: 4.4 10*3/uL (ref 1.7–7.7)
Neutrophils Relative %: 63 %
Platelets: 490 10*3/uL — ABNORMAL HIGH (ref 150–400)
RBC: 3.69 MIL/uL — ABNORMAL LOW (ref 3.87–5.11)
RDW: 14.6 % (ref 11.5–15.5)
WBC: 7 10*3/uL (ref 4.0–10.5)
nRBC: 0 % (ref 0.0–0.2)

## 2022-05-21 NOTE — Progress Notes (Signed)
Hematology/Oncology Progress note [phone] [phone]      Patient Care Team: Jearld Fenton, NP as PCP - General (Internal Medicine)  ASSESSMENT & PLAN:   Essential thrombocythemia [facility]) Labs reviewed and discussed with patient.  Platelet count is 490,000. Recommend patient to continue hydroxyurea to '1000mg'$  1 day per week, and '500mg'$  daily the rest of the days.   Macrocytosis No anemia. Due to hydroxyurea use.   Abdominal pain She has GERD symptoms.  Discussed about obtaining CT scan for evaluation, she declined.  I recommend GI work up- refer to gastroenterology   Orders Placed This Encounter  Procedures   CBC with Differential/Platelet    Standing Status:   Future    Standing Expiration Date:   05/22/2023   Comprehensive metabolic panel    Standing Status:   Future    Standing Expiration Date:   05/21/2023   Ambulatory referral to Gastroenterology    Referral Priority:   Routine    Referral Type:   Consultation    Referral Reason:   Specialty Services Required    Referred to Provider:   Efrain Sella, MD    Requested Specialty:   Gastroenterology    Number of Visits Requested:   1   Follow  Follow up in 3 months.   All questions were answered. The patient knows to call the clinic with any problems, questions or concerns.  Earlie Server, MD, PhD [facility] Hematology Oncology 05/21/2022    CHIEF COMPLAINTS/REASON FOR VISIT:  Essential thrombocythemia  HISTORY OF PRESENTING ILLNESS:  Tiffany Richardson is a 77 y.o. female who was seen in consultation at the request of Baity, Coralie Keens, NP for evaluation of thromcbocytosis Reviewed patient's labs.  08/07/2020 labs showed elevated platelet counts at 904,000.  Normal wbc  and hemoglobin   Reviewed patient's previous labs. Thrombocytosis onset is chronic onset , duration since at least 2017 No aggravating or elevated factors. Associated symptoms or signs:  Denies weight loss, fever, chills,  fatigue, night sweats.  Context:  Smoking history: Denies Family history of polycythemia.  Denies.  However his father had a history of needing frequent blood removal History of iron deficiency anemia: Denies History of DVT: Denies She also reports chronic abdominal bloating.  She has IBS.  Denies any unintentional weight loss, fever, nausea vomiting.  No recent colonoscopy in current EMR. She is widowed, husband had colon cancer.  # Jak 2 V61F mutation positive 08/21/2020 bone marrow biopsy results showed hypercellular bone marrow for age with features of myeloproliferative neoplasm.  Consistent with essential thrombocythemia, iron deficient polycythemia vera, pretty fibrotic/early primary myelofibrosis. Patient has no erythrocytosis.  No chronic leukocytosis.  Clinically most consistent with essential thrombocythemia.  # Chronic bloating and abdominal discomfort, possible due to chronic IBS symptoms.  Last colonoscopy 2013. Patient was seen by Dr. Vira Agar in 2019 and was recommended to increase soluble fiber, MiraLAX, if not helpful trial of FODMAP diet and probiotics.  Discussed with patient that I recommend surveillance colonoscopy.  07/06/2021, CT scan showed constipation.  Otherwise no acute intra-a patient bdominal or intrapelvic abnormality. Reports alternate of constipation and diarrhea.  Not associated with any food or medication.  INTERVAL HISTORY Tiffany Richardson is a 77 y.o. female who has above history reviewed by me today presents for follow up visit for management of essential thrombocythemia Patient has been on  hydroxyurea to '1000mg'$  1 day per week, and '500mg'$  daily the rest of the days.   Patient tolerates well.  She reports no appetite and her stomach feels like "its in a knot". Per patient, this is a chronic issue for her, she had CT scan done in March 2023 which did not explain her discomfort. She feels very stressed due to her abdominal symptoms. She also has life stressors,  currently unemployed.    Review of Systems  Constitutional:  Negative for appetite change, chills, fatigue and fever.  HENT:   Negative for hearing loss and voice change.   Eyes:  Negative for eye problems.  Respiratory:  Negative for chest tightness and cough.   Cardiovascular:  Negative for chest pain.  Gastrointestinal:  Positive for abdominal pain. Negative for abdominal distention and blood in stool.       Epigastric/left upper quadrant pain, constant, not associated with eating.   Endocrine: Negative for hot flashes.  Genitourinary:  Negative for difficulty urinating and frequency.   Musculoskeletal:  Negative for arthralgias.  Skin:  Negative for itching and rash.  Neurological:  Negative for extremity weakness.  Hematological:  Negative for adenopathy.  Psychiatric/Behavioral:  Negative for confusion.        Feel stressed    MEDICAL HISTORY:  Past Medical History:  Diagnosis Date   Allergy    Cancer ([place])    basal cell   Emphysema of lung ([place])    Hypertension     SURGICAL HISTORY: Past Surgical History:  Procedure Laterality Date   basal cell removed     Rt eye   squamous cell removed       SOCIAL HISTORY: Social History   Socioeconomic History   Marital status: Widowed    Spouse name: Not on file   Number of children: Not on file   Years of education: Not on file   Highest education level: Not on file  Occupational History   Not on file  Tobacco Use   Smoking status: Never   Smokeless tobacco: Never  Vaping Use   Vaping Use: Never used  Substance and Sexual Activity   Alcohol use: No    Alcohol/week: 0.0 standard drinks of alcohol   Drug use: No   Sexual activity: Not on file  Other Topics Concern   Not on file  Social History Narrative   Not on file   Social Determinants of Health   Financial Resource Strain: Not on file  Food Insecurity: Not on file  Transportation Needs: Not on file  Physical Activity: Not on file  Stress: Not on  file  Social Connections: Not on file  Intimate Partner Violence: Not on file    FAMILY HISTORY: Family History  Problem Relation Age of Onset   Leukemia Mother    Heart disease Father    Non-Hodgkin's lymphoma Daughter     ALLERGIES:  is allergic to codeine.  MEDICATIONS:  Current Outpatient Medications  Medication Sig Dispense Refill   aspirin EC 81 MG tablet Take 81 mg by mouth.     atorvastatin (LIPITOR) 20 MG tablet TAKE 1 TABLET BY MOUTH EVERY DAY 90 tablet 1   fluticasone (FLONASE) 50 MCG/ACT nasal spray SPRAY 2 SPRAYS INTO EACH NOSTRIL EVERY DAY 48 mL 0   hydroxyurea (HYDREA) 500 MG capsule Take 1 capsule (500 mg total) by mouth See admin instructions. Take '1000mg'$  on Mondays, and take '500mg'$  daily for the rest of the week. 94 capsule 0   metoprolol succinate (TOPROL-XL) 50 MG 24 hr tablet TAKE 1 TABLET BY MOUTH EVERY DAY WITH OR IMMEDIATELY FOLLOWING A MEAL 90 tablet  0   pantoprazole (PROTONIX) 40 MG tablet TAKE 1 TABLET BY MOUTH EVERY DAY 90 tablet 0   metroNIDAZOLE (METROCREAM) 0.75 % cream SMARTSIG:Sparingly Topical 1-2 Times Daily (Patient not taking: Reported on 02/18/2022)     No current facility-administered medications for this visit.     PHYSICAL EXAMINATION: ECOG PERFORMANCE STATUS: 0 - Asymptomatic Vitals:   05/21/22 1429  BP: (!) 143/74  Pulse: 68  Resp: 18  Temp: (!) 97.5 F (36.4 C)   Filed Weights   05/21/22 1429  Weight: 148 lb 6.4 oz (67.3 kg)    Physical Exam Constitutional:      General: She is not in acute distress. HENT:     Head: Normocephalic.  Eyes:     General: No scleral icterus. Cardiovascular:     Rate and Rhythm: Normal rate.  Pulmonary:     Effort: Pulmonary effort is normal. No respiratory distress.  Abdominal:     General: There is no distension.  Musculoskeletal:        General: No deformity. Normal range of motion.     Cervical back: Normal range of motion.  Skin:    General: Skin is warm and dry.     Findings: No  erythema or rash.  Neurological:     Mental Status: She is alert and oriented to person, place, and time. Mental status is at baseline.     Cranial Nerves: No cranial nerve deficit.     Coordination: Coordination normal.  Psychiatric:        Mood and Affect: Mood normal.      LABORATORY DATA:  I have reviewed the data as listed    Latest Ref Rng & Units 05/21/2022    2:14 PM 04/01/2022    8:07 AM 02/18/2022    9:28 AM  CBC  WBC 4.0 - 10.5 K/uL 7.0  6.5  7.0   Hemoglobin 12.0 - 15.0 g/dL 13.8  13.7  14.4   Hematocrit 36.0 - 46.0 % 40.8  40.1  43.2   Platelets 150 - 400 K/uL 490  480  557       Latest Ref Rng & Units 05/21/2022    2:14 PM 02/18/2022    9:28 AM 10/29/2021    1:28 PM  CMP  Glucose 70 - 99 mg/dL 94  85  87   BUN 8 - 23 mg/dL '18  13  13   '$ Creatinine 0.44 - 1.00 mg/dL 0.92  0.85  0.94   Sodium 135 - 145 mmol/L 138  136  134   Potassium 3.5 - 5.1 mmol/L 4.4  4.5  4.7   Chloride 98 - 111 mmol/L 101  104  101   CO2 22 - 32 mmol/L '27  29  27   '$ Calcium 8.9 - 10.3 mg/dL 8.8  8.7  8.7   Total Protein 6.5 - 8.1 g/dL 7.2  7.2  7.0   Total Bilirubin 0.3 - 1.2 mg/dL 0.8  1.0  1.0   Alkaline Phos 38 - 126 U/L 126  131  121   AST 15 - 41 U/L '23  23  22   '$ ALT 0 - 44 U/L '19  19  17    '$ Lab Results  Component Value Date   IRON 85 08/15/2020   TIBC 337 08/15/2020   FERRITIN 31 08/15/2020       RADIOGRAPHIC STUDIES: I have personally reviewed the radiological images as listed and agreed with the findings in the report. No results found.

## 2022-05-21 NOTE — Telephone Encounter (Signed)
Requested Prescriptions  Pending Prescriptions Disp Refills   fluticasone (FLONASE) 50 MCG/ACT nasal spray [Pharmacy Med Name: FLUTICASONE PROP 50 MCG SPRAY] 48 mL 0    Sig: SPRAY 2 SPRAYS INTO EACH NOSTRIL EVERY DAY     Ear, Nose, and Throat: Nasal Preparations - Corticosteroids Passed - 05/21/2022  1:32 AM      Passed - Valid encounter within last 12 months    Recent Outpatient Visits           3 months ago Acute bacterial sinusitis   [facility] [place], [place] W, NP   9 months ago Irritable bowel syndrome with both constipation and diarrhea   [facility] [place], Coralie Keens, NP   10 months ago Diarrhea, unspecified type   [facility] [place], Coralie Keens, NP   1 year ago Medicare annual wellness visit, subsequent   [facility] [place], Coralie Keens, NP   1 year ago Primary hypertension   [facility] [place], Coralie Keens, [place]

## 2022-05-21 NOTE — Assessment & Plan Note (Signed)
No anemia. Due to hydroxyurea use.

## 2022-05-21 NOTE — Progress Notes (Signed)
Pt here for follow up. Pt reports that she has no appetite an stomach feels like "its in a knot"

## 2022-05-21 NOTE — Assessment & Plan Note (Addendum)
Labs reviewed and discussed with patient.  Platelet count is 490,000. Recommend patient to continue hydroxyurea to '1000mg'$  1 day per week, and '500mg'$  daily the rest of the days.

## 2022-05-21 NOTE — Assessment & Plan Note (Addendum)
She has GERD symptoms.  Discussed about obtaining CT scan for evaluation, she declined.  I recommend GI work up- refer to gastroenterology

## 2022-05-27 ENCOUNTER — Ambulatory Visit: Payer: Self-pay | Admitting: *Deleted

## 2022-05-27 NOTE — Telephone Encounter (Signed)
  Chief Complaint: Abdominal Cramping Symptoms: "Cramping" left side abdomen above navel, radiates across stomach. States has "IBS for years" cramping off and on for years but worsening past 2 weeks. Frequency: 2 weeks Pertinent Negatives: Patient denies N/V/D fever, dysuria Disposition: '[]'$ ED /'[]'$ Urgent Care (no appt availability in office) / '[x]'$ Appointment(In office/virtual)/ '[]'$  [facility]/ '[]'$ Home Care/ '[]'$ Refused Recommended Disposition /'[]'$ [facility]/ '[]'$  Follow-up with PCP Additional Notes: Pt states has appt with GI in April. States just need to be checked out  before then, advised to call PCP. Appt secured for tomorrow, care advise provided, verbalizes understanding.  Reason for Disposition  [1] MODERATE pain (e.g., interferes with normal activities) AND [2] pain comes and goes (cramps) AND [3] present > 24 hours  (Exception: Pain with Vomiting or Diarrhea - see that Guideline.)  Answer Assessment - Initial Assessment Questions 1. LOCATION: "Where does it hurt?"      Above belly button, left sided 2. RADIATION: "Does the pain shoot anywhere else?" (e.g., chest, back)     Across stomach 3. ONSET: "When did the pain begin?" (e.g., minutes, hours or days ago)      Worsening last 2 weeks 4. SUDDEN: "Gradual or sudden onset?"     Gradual 5. PATTERN "Does the pain come and go, or is it constant?"    - If it comes and goes: "How long does it last?" "Do you have pain now?"     (Note: Comes and goes means the pain is intermittent. It goes away completely between bouts.)    - If constant: "Is it getting better, staying the same, or getting worse?"      (Note: Constant means the pain never goes away completely; most serious pain is constant and gets worse.)      Comes and goes,cramping 6. SEVERITY: "How bad is the pain?"  (e.g., Scale 1-10; mild, moderate, or severe)    - MILD (1-3): Doesn't interfere with normal activities, abdomen soft and not tender to touch.     -  MODERATE (4-7): Interferes with normal activities or awakens from sleep, abdomen tender to touch.     - SEVERE (8-10): Excruciating pain, doubled over, unable to do any normal activities.       8/10 7. RECURRENT SYMPTOM: "Have you ever had this type of stomach pain before?" If Yes, ask: "When was the last time?" and "What happened that time?"      Yes 8. CAUSE: "What do you think is causing the stomach pain?"     IBS 9. RELIEVING/AGGRAVATING FACTORS: "What makes it better or worse?" (e.g., antacids, bending or twisting motion, bowel movement)     No 10. OTHER SYMPTOMS: "Do you have any other symptoms?" (e.g., back pain, diarrhea, fever, urination pain, vomiting)       "I have IBS"  Protocols used: Abdominal Pain - Female-A-AH

## 2022-05-28 ENCOUNTER — Encounter: Payer: Self-pay | Admitting: Internal Medicine

## 2022-05-28 ENCOUNTER — Ambulatory Visit (INDEPENDENT_AMBULATORY_CARE_PROVIDER_SITE_OTHER): Payer: Medicare Other | Admitting: Internal Medicine

## 2022-05-28 VITALS — BP 134/80 | HR 70 | Temp 96.8°F | Wt 148.0 lb

## 2022-05-28 DIAGNOSIS — I10 Essential (primary) hypertension: Secondary | ICD-10-CM | POA: Diagnosis not present

## 2022-05-28 DIAGNOSIS — I700 Atherosclerosis of aorta: Secondary | ICD-10-CM | POA: Insufficient documentation

## 2022-05-28 DIAGNOSIS — K582 Mixed irritable bowel syndrome: Secondary | ICD-10-CM | POA: Diagnosis not present

## 2022-05-28 DIAGNOSIS — Z6825 Body mass index (BMI) 25.0-25.9, adult: Secondary | ICD-10-CM

## 2022-05-28 DIAGNOSIS — E782 Mixed hyperlipidemia: Secondary | ICD-10-CM | POA: Diagnosis not present

## 2022-05-28 DIAGNOSIS — D473 Essential (hemorrhagic) thrombocythemia: Secondary | ICD-10-CM

## 2022-05-28 DIAGNOSIS — K219 Gastro-esophageal reflux disease without esophagitis: Secondary | ICD-10-CM | POA: Diagnosis not present

## 2022-05-28 DIAGNOSIS — E663 Overweight: Secondary | ICD-10-CM | POA: Insufficient documentation

## 2022-05-28 MED ORDER — HYOSCYAMINE SULFATE 0.125 MG PO TABS: 0.1250 mg | ORAL_TABLET | ORAL | 0 refills | Status: DC | PRN

## 2022-05-28 MED ORDER — OMEPRAZOLE 40 MG PO CPDR: 40.0000 mg | DELAYED_RELEASE_CAPSULE | Freq: Two times a day (BID) | ORAL | 1 refills | Status: DC

## 2022-05-28 NOTE — Assessment & Plan Note (Signed)
Encourage diet and exercise for weight loss 

## 2022-05-28 NOTE — Assessment & Plan Note (Signed)
CBC reviewed Continue hydroxyurea She will continue to follow with hematology

## 2022-05-28 NOTE — Assessment & Plan Note (Signed)
Lipid profile today Encouraged her to consume low-fat diet Continue atorvastatin and aspirin

## 2022-05-28 NOTE — Patient Instructions (Signed)
Diet for Irritable Bowel Syndrome When you have irritable bowel syndrome (IBS), it is very important to follow the eating habits that are best for your condition. IBS may cause various symptoms, such as pain in the abdomen, constipation, or diarrhea. Choosing the right foods can help to ease the discomfort from these symptoms. Work with your health care provider and dietitian to find the eating plan that will help to control your symptoms. What are tips for following this plan?  Keep a food diary. This will help you identify foods that cause symptoms. Write down: What you eat and when you eat it. What symptoms you have. When symptoms occur in relation to your meals, such as "pain in abdomen 2 hours after dinner." Eat your meals slowly and in a relaxed setting. Aim to eat 5-6 small meals per day. Do not skip meals. Drink enough fluid to keep your urine pale yellow. Ask your health care provider if you should take an over-the-counter probiotic to help restore healthy bacteria in your gut (digestive tract). Probiotics are foods that contain good bacteria and yeasts. Your dietitian may have specific dietary recommendations for you based on your symptoms. Your dietitian may recommend that you: Avoid foods that cause symptoms. Talk with your dietitian about other ways to get the same nutrients that are in those problem foods. Avoid foods with gluten. Gluten is a protein that is found in rye, wheat, and barley. Eat more foods that contain soluble fiber. Examples of foods with high soluble fiber include oats, seeds, and certain fruits and vegetables. Take a fiber supplement if told by your dietitian. Reduce or avoid certain foods called FODMAPs. These are foods that contain sugars that are hard for some people to digest. Ask your health care provider which foods to avoid. What foods should I avoid? The following are some foods and drinks that may make your symptoms worse: Fatty foods, such as french  fries. Foods that contain gluten, such as pasta and cereal. Dairy products, such as milk, cheese, and ice cream. Spicy foods. Alcohol. Products with caffeine, such as coffee, tea, or chocolate. Carbonated drinks, such as soda. Foods that are high in FODMAPs. These include certain fruits and vegetables. Products with sweeteners such as honey, high fructose corn syrup, sorbitol, and mannitol. The items listed above may not be a complete list of foods and beverages you should avoid. Contact a dietitian for more information. What foods are good sources of fiber? Your health care provider or dietitian may recommend that you eat more foods that contain fiber. Fiber can help to reduce constipation and other IBS symptoms. Add foods with fiber to your diet a little at a time so your body can get used to them. Too much fiber at one time might cause gas and swelling of your abdomen. The following are some foods that are good sources of fiber: Berries, such as raspberries, strawberries, and blueberries. Tomatoes. Carrots. Brown rice. Oats. Seeds, such as chia and pumpkin seeds. The items listed above may not be a complete list of recommended sources of fiber. Contact your dietitian for more options. Where to find more information International Foundation for Functional Gastrointestinal Disorders: aboutibs.org National Institute of Diabetes and Digestive and Kidney Diseases: niddk.nih.gov Summary When you have irritable bowel syndrome (IBS), it is very important to follow the eating habits that are best for your condition. IBS may cause various symptoms, such as pain in the abdomen, constipation, or diarrhea. Choosing the right foods can help to ease the   discomfort that comes from symptoms. Your health care provider or dietitian may recommend that you eat more foods that contain fiber. Keep a food diary. This will help you identify foods that cause symptoms. This information is not intended to replace  advice given to you by your health care provider. Make sure you discuss any questions you have with your health care provider. Document Revised: 03/20/2021 Document Reviewed: 03/20/2021 Elsevier Patient Education  2023 Elsevier Inc.  

## 2022-05-28 NOTE — Assessment & Plan Note (Signed)
Will discontinue pantoprazole Rx for omeprazole 40 mg twice daily She has an appointment with GI scheduled

## 2022-05-28 NOTE — Progress Notes (Signed)
Subjective:    Patient ID: Tiffany Richardson, female    DOB: 1946/02/02, 77 y.o.   MRN: [id number]  HPI  Patient presents to clinic today with complaint for follow-up of chronic conditions.   HTN: Her BP today is 134/80.  She is taking Metoprolol as prescribed.  ECG from 04/2015 reviewed.  GERD: Triggered by stress.  She has breakthrough on Pantoprazole, would like to switch back to Omeprazole.  There is no upper GI on file.   IBS: Alternating constipation and diarrhea. She reports worsening abdominal cramping.  This started 2 weeks ago.  She has failed Escitalopram, Dicyclomine and Amitriptyline in the past.  She has an appointment with GI in April.  Colonoscopy from 12/2011 reviewed, no history of diverticulosis.  Thrombocytopenia: Her last platelet count was 490, 04/2022.  She is taking hydroxyurea as prescribed.  She follows with hematology.  HLD with Aortic Atherosclerosis: Her last LDL was 74, triglycerides 250, 03/2021.  She denies myalgias on Atorvastatin.  She is taking Aspirin as well.  She does not consume low-fat diet.  Review of Systems     Past Medical History:  Diagnosis Date   Allergy    Cancer ([place])    basal cell   Emphysema of lung ([facility])    Hypertension     Current Outpatient Medications  Medication Sig Dispense Refill   aspirin EC 81 MG tablet Take 81 mg by mouth.     atorvastatin (LIPITOR) 20 MG tablet TAKE 1 TABLET BY MOUTH EVERY DAY 90 tablet 1   fluticasone (FLONASE) 50 MCG/ACT nasal spray SPRAY 2 SPRAYS INTO EACH NOSTRIL EVERY DAY 48 mL 0   hydroxyurea (HYDREA) 500 MG capsule Take 1 capsule (500 mg total) by mouth See admin instructions. Take '1000mg'$  on Mondays, and take '500mg'$  daily for the rest of the week. 94 capsule 0   metoprolol succinate (TOPROL-XL) 50 MG 24 hr tablet TAKE 1 TABLET BY MOUTH EVERY DAY WITH OR IMMEDIATELY FOLLOWING A MEAL 90 tablet 0   metroNIDAZOLE (METROCREAM) 0.75 % cream SMARTSIG:Sparingly Topical 1-2 Times Daily (Patient not taking:  Reported on 02/18/2022)     pantoprazole (PROTONIX) 40 MG tablet TAKE 1 TABLET BY MOUTH EVERY DAY 90 tablet 0   No current facility-administered medications for this visit.    Allergies  Allergen Reactions   Codeine Swelling    Family History  Problem Relation Age of Onset   Leukemia Mother    Heart disease Father    Non-Hodgkin's lymphoma Daughter     Social History   Socioeconomic History   Marital status: Widowed    Spouse name: Not on file   Number of children: Not on file   Years of education: Not on file   Highest education level: Not on file  Occupational History   Not on file  Tobacco Use   Smoking status: Never   Smokeless tobacco: Never  Vaping Use   Vaping Use: Never used  Substance and Sexual Activity   Alcohol use: No    Alcohol/week: 0.0 standard drinks of alcohol   Drug use: No   Sexual activity: Not on file  Other Topics Concern   Not on file  Social History Narrative   Not on file   Social Determinants of Health   Financial Resource Strain: Not on file  Food Insecurity: Not on file  Transportation Needs: Not on file  Physical Activity: Not on file  Stress: Not on file  Social Connections: Not on file  Intimate  Partner Violence: Not on file     Constitutional: Denies fever, malaise, fatigue, headache or abrupt weight changes.  HEENT: Denies eye pain, eye redness, ear pain, ringing in the ears, wax buildup, runny nose, nasal congestion, bloody nose, or sore throat. Respiratory: Denies difficulty breathing, shortness of breath, cough or sputum production.   Cardiovascular: Denies chest pain, chest tightness, palpitations or swelling in the hands or feet.  Gastrointestinal: Patient reports reflux, abdominal cramping, alternating constipation and diarrhea.  Denies bloating, or blood in the stool.  GU: Denies urgency, frequency, pain with urination, burning sensation, blood in urine, odor or discharge. Musculoskeletal: Denies decrease in range  of motion, difficulty with gait, muscle pain or joint pain and swelling.  Skin: Denies redness, rashes, lesions or ulcercations.  Neurological: Denies dizziness, difficulty with memory, difficulty with speech or problems with balance and coordination.  Psych: Patient reports stress.  Denies anxiety, depression, SI/HI.  No other specific complaints in a complete review of systems (except as listed in HPI above).  Objective:   Physical Exam  BP 134/80 (BP Location: Right Arm, Patient Position: Sitting, Cuff Size: Normal)   Pulse 70   Temp (!) 96.8 F (36 C) (Temporal)   Wt 148 lb (67.1 kg)   SpO2 99%   BMI 25.40 kg/m   Wt Readings from Last 3 Encounters:  05/21/22 148 lb 6.4 oz (67.3 kg)  02/18/22 146 lb 11.2 oz (66.5 kg)  10/29/21 145 lb (65.8 kg)    General: Appears her stated age, overweight, in NAD. Skin: Warm, dry and intact. No rashes noted. HEENT: Head: normal shape and size; Eyes: sclera white, no icterus, conjunctiva pink, PERRLA and EOMs intact;  Cardiovascular: Normal rate and rhythm. S1,S2 noted.  No murmur, rubs or gallops noted. No JVD or BLE edema. No carotid bruits noted. Pulmonary/Chest: Normal effort and positive vesicular breath sounds. No respiratory distress. No wheezes, rales or ronchi noted.  Abdomen: Soft and nontender. Normal bowel sounds. No distention or masses noted. Musculoskeletal: No difficulty with gait.  Neurological: Alert and oriented.  Coordination normal.  Psychiatric: Mood and affect normal.  Mildly anxious appearing. Judgment and thought content normal.     BMET    Component Value Date/Time   NA 138 05/21/2022 1414   NA 142 10/11/2016 0932   K 4.4 05/21/2022 1414   CL 101 05/21/2022 1414   CO2 27 05/21/2022 1414   GLUCOSE 94 05/21/2022 1414   BUN 18 05/21/2022 1414   BUN 12 10/11/2016 0932   CREATININE 0.92 05/21/2022 1414   CREATININE 0.90 04/02/2021 0840   CALCIUM 8.8 (L) 05/21/2022 1414   GFRNONAA >60 05/21/2022 1414    GFRNONAA 63 08/07/2020 1001   GFRAA 74 08/07/2020 1001    Lipid Panel     Component Value Date/Time   CHOL 142 04/02/2021 0840   CHOL 220 (H) 10/11/2016 0932   TRIG 250 (H) 04/02/2021 0840   HDL 35 (L) 04/02/2021 0840   HDL 42 10/11/2016 0932   CHOLHDL 4.1 04/02/2021 0840   LDLCALC 74 04/02/2021 0840    CBC    Component Value Date/Time   WBC 7.0 05/21/2022 1414   RBC 3.69 (L) 05/21/2022 1414   HGB 13.8 05/21/2022 1414   HGB 13.0 05/29/2015 1404   HCT 40.8 05/21/2022 1414   HCT 39.0 05/29/2015 1404   PLT 490 (H) 05/21/2022 1414   PLT 684 (H) 05/29/2015 1404   MCV 110.6 (H) 05/21/2022 1414   MCV 90 05/29/2015 1404  MCH 37.4 (H) 05/21/2022 1414   MCHC 33.8 05/21/2022 1414   RDW 14.6 05/21/2022 1414   RDW 13.6 05/29/2015 1404   LYMPHSABS 2.0 05/21/2022 1414   LYMPHSABS 3.5 (H) 05/29/2015 1404   MONOABS 0.4 05/21/2022 1414   EOSABS 0.1 05/21/2022 1414   EOSABS 0.3 05/29/2015 1404   BASOSABS 0.1 05/21/2022 1414   BASOSABS 0.1 05/29/2015 1404    Hgb A1C No results found for: "HGBA1C"        Assessment & Plan:      RTC in 6 months for follow-up of chronic conditions Webb Silversmith, NP

## 2022-05-28 NOTE — Assessment & Plan Note (Signed)
Deteriorated We will try hyoscyamine Encouraged high-fiber diet and adequate water intake She has an appointment with GI scheduled

## 2022-05-28 NOTE — Assessment & Plan Note (Signed)
C-Met and lipid profile today Encouraged her to consume a low-fat diet Continue atorvastatin 

## 2022-05-28 NOTE — Assessment & Plan Note (Signed)
Controlled on metoprolol Reinforced DASH diet and exercise for weight loss

## 2022-05-29 ENCOUNTER — Telehealth: Payer: Self-pay

## 2022-05-29 DIAGNOSIS — E782 Mixed hyperlipidemia: Secondary | ICD-10-CM

## 2022-05-29 LAB — LIPID PANEL
Cholesterol: 170 mg/dL (ref <200)
HDL: 36 mg/dL — ABNORMAL LOW (ref >=50)
LDL Cholesterol (Calc): 98 mg/dL (calc)
Non-HDL Cholesterol (Calc): 134 mg/dL (calc) — ABNORMAL HIGH (ref <130)
Total CHOL/HDL Ratio: 4.7 (calc) (ref <5.0)
Triglycerides: 240 mg/dL — ABNORMAL HIGH (ref <150)

## 2022-05-29 NOTE — Telephone Encounter (Unsigned)
Copied from [place] [id number]. Topic: General - Other >> May 29, 2022  2:28 PM [place] B wrote: Reason for CRM: Patient called in states wants to try medicine Dr Garnette Gunner suggested to bring down her triglycerides. She doesn't know the name of it.

## 2022-05-30 MED ORDER — ATORVASTATIN CALCIUM 40 MG PO TABS: 40.0000 mg | ORAL_TABLET | Freq: Every day | ORAL | 1 refills | Status: DC

## 2022-05-30 NOTE — Addendum Note (Signed)
Addended by: Jearld Fenton on: 05/30/2022 08:52 AM   Modules accepted: Orders

## 2022-05-30 NOTE — Telephone Encounter (Signed)
Pt advised lab only pt schedule for 09/02/2022.    Thanks,   -Mickel Baas

## 2022-05-30 NOTE — Telephone Encounter (Signed)
I have increased atorvastatin to 40 mg daily.  Please have her schedule lab only appointment in 3 months to recheck cholesterol.

## 2022-06-17 DIAGNOSIS — L708 Other acne: Secondary | ICD-10-CM | POA: Diagnosis not present

## 2022-06-17 DIAGNOSIS — L298 Other pruritus: Secondary | ICD-10-CM | POA: Diagnosis not present

## 2022-06-17 DIAGNOSIS — D224 Melanocytic nevi of scalp and neck: Secondary | ICD-10-CM | POA: Diagnosis not present

## 2022-06-17 DIAGNOSIS — L570 Actinic keratosis: Secondary | ICD-10-CM | POA: Diagnosis not present

## 2022-06-17 DIAGNOSIS — L814 Other melanin hyperpigmentation: Secondary | ICD-10-CM | POA: Diagnosis not present

## 2022-06-17 DIAGNOSIS — Z7189 Other specified counseling: Secondary | ICD-10-CM | POA: Diagnosis not present

## 2022-06-17 DIAGNOSIS — Z85828 Personal history of other malignant neoplasm of skin: Secondary | ICD-10-CM | POA: Diagnosis not present

## 2022-06-17 DIAGNOSIS — D485 Neoplasm of uncertain behavior of skin: Secondary | ICD-10-CM | POA: Diagnosis not present

## 2022-06-17 DIAGNOSIS — X32XXXA Exposure to sunlight, initial encounter: Secondary | ICD-10-CM | POA: Diagnosis not present

## 2022-06-17 DIAGNOSIS — L820 Inflamed seborrheic keratosis: Secondary | ICD-10-CM | POA: Diagnosis not present

## 2022-06-17 DIAGNOSIS — Z872 Personal history of diseases of the skin and subcutaneous tissue: Secondary | ICD-10-CM | POA: Diagnosis not present

## 2022-06-17 DIAGNOSIS — Z08 Encounter for follow-up examination after completed treatment for malignant neoplasm: Secondary | ICD-10-CM | POA: Diagnosis not present

## 2022-06-17 DIAGNOSIS — L538 Other specified erythematous conditions: Secondary | ICD-10-CM | POA: Diagnosis not present

## 2022-06-28 ENCOUNTER — Other Ambulatory Visit: Payer: Self-pay | Admitting: Oncology

## 2022-06-28 DIAGNOSIS — H52221 Regular astigmatism, right eye: Secondary | ICD-10-CM | POA: Diagnosis not present

## 2022-06-28 DIAGNOSIS — H2511 Age-related nuclear cataract, right eye: Secondary | ICD-10-CM | POA: Diagnosis not present

## 2022-07-09 DIAGNOSIS — I10 Essential (primary) hypertension: Secondary | ICD-10-CM | POA: Diagnosis not present

## 2022-07-09 DIAGNOSIS — I83892 Varicose veins of left lower extremities with other complications: Secondary | ICD-10-CM | POA: Diagnosis not present

## 2022-07-09 DIAGNOSIS — I83813 Varicose veins of bilateral lower extremities with pain: Secondary | ICD-10-CM | POA: Diagnosis not present

## 2022-07-09 DIAGNOSIS — R252 Cramp and spasm: Secondary | ICD-10-CM | POA: Diagnosis not present

## 2022-07-09 DIAGNOSIS — M7989 Other specified soft tissue disorders: Secondary | ICD-10-CM | POA: Diagnosis not present

## 2022-07-16 ENCOUNTER — Other Ambulatory Visit: Payer: Self-pay | Admitting: Internal Medicine

## 2022-07-16 DIAGNOSIS — J3089 Other allergic rhinitis: Secondary | ICD-10-CM

## 2022-07-16 DIAGNOSIS — E782 Mixed hyperlipidemia: Secondary | ICD-10-CM

## 2022-07-16 DIAGNOSIS — I10 Essential (primary) hypertension: Secondary | ICD-10-CM

## 2022-07-17 NOTE — Telephone Encounter (Signed)
Atorvastatin- no longer current dosing Metoprolol- too early- Rx 05/20/22 #90 Requested Prescriptions  Pending Prescriptions Disp Refills   fluticasone (FLONASE) 50 MCG/ACT nasal spray [Pharmacy Med Name: FLUTICASONE PROP 50 MCG SPRAY] 48 mL 0    Sig: SPRAY 2 SPRAYS INTO EACH NOSTRIL EVERY DAY     Ear, Nose, and Throat: Nasal Preparations - Corticosteroids Passed - 07/16/2022  3:40 PM      Passed - Valid encounter within last 12 months    Recent Outpatient Visits           1 month ago Mixed hyperlipidemia   [facility] [place], [place] W, NP   5 months ago Acute bacterial sinusitis   [facility] [place], [place], NP   11 months ago Irritable bowel syndrome with both constipation and diarrhea   [facility] [place], [place]ie Keens, NP   1 year ago Diarrhea, unspecified type   [facility] [place], Coralie Keens, NP   1 year ago Medicare annual wellness visit, subsequent   [facility] [place], [place] W, NP               hyoscyamine (LEVSIN) 0.125 MG tablet [Pharmacy Med Name: HYOSCYAMINE SULF 0.125 MG TAB] 30 tablet 0    Sig: TAKE 1 TABLET (0.125 MG TOTAL) BY MOUTH EVERY 4 (FOUR) HOURS AS NEEDED.     Gastroenterology:  Antispasmodic Agents Passed - 07/16/2022  3:40 PM      Passed - Valid encounter within last 12 months    Recent Outpatient Visits           1 month ago Mixed hyperlipidemia   [facility] [place], [place] W, NP   5 months ago Acute bacterial sinusitis   [facility] [place], [place], NP   11 months ago Irritable bowel syndrome with both constipation and diarrhea   [facility] [place], Coralie Keens, NP   1 year ago Diarrhea, unspecified type   [facility] [place], Coralie Keens, NP   1 year ago Medicare annual wellness visit, subsequent   [facility] [place], Coralie Keens, NP              Refused Prescriptions Disp Refills   metoprolol succinate (TOPROL-XL) 50 MG 24 hr tablet [Pharmacy Med Name: METOPROLOL SUCC ER 50 MG TAB] 90 tablet 0    Sig: TAKE 1 TABLET BY MOUTH EVERY DAY WITH OR IMMEDIATELY FOLLOWING A MEAL     Cardiovascular:  Beta Blockers Passed - 07/16/2022  3:40 PM      Passed - Last BP in normal range    BP Readings from Last 1 Encounters:  05/28/22 134/80         Passed - Last Heart Rate in normal range    Pulse Readings from Last 1 Encounters:  05/28/22 70         Passed - Valid encounter within last 6 months    Recent Outpatient Visits           1 month ago Mixed hyperlipidemia   [facility] [place], Coralie Keens, NP   5 months ago Acute bacterial sinusitis   [facility] [place], [place], NP   11 months ago Irritable bowel syndrome with both constipation and diarrhea  [facility] [place], Coralie Keens, NP   1 year ago Diarrhea, unspecified type   [facility], Coralie Keens, NP   1 year ago Medicare annual wellness visit, subsequent   [facility] [place], [place], [place]               atorvastatin (LIPITOR) 20 MG tablet [Pharmacy Med Name: ATORVASTATIN 20 MG TABLET] 90 tablet 1    Sig: TAKE 1 TABLET BY MOUTH EVERY DAY     Cardiovascular:  Antilipid - Statins Failed - 07/16/2022  3:40 PM      Failed - Lipid Panel in normal range within the last 12 months    Cholesterol, Total  Date Value Ref Range Status  10/11/2016 220 (H) 100 - 199 mg/dL Final   Cholesterol  Date Value Ref Range Status  05/28/2022 170 <200 mg/dL Final   LDL Cholesterol (Calc)  Date Value Ref Range Status  05/28/2022 98 mg/dL (calc) Final    Comment:    Reference range: <100 . Desirable range <100 mg/dL for primary prevention;   <70 mg/dL for patients with CHD or diabetic  patients  with > or = 2 CHD risk factors. Marland Kitchen LDL-C is now calculated using the Martin-Hopkins  calculation, which is a validated novel method providing  better accuracy than the Friedewald equation in the  estimation of LDL-C.  Cresenciano Genre et al. Annamaria Helling. [id number]): 2061-2068  (http://education.[facility].com/faq/FAQ164)    HDL  Date Value Ref Range Status  05/28/2022 36 (L) > OR = 50 mg/dL Final  10/11/2016 42 >39 mg/dL Final   Triglycerides  Date Value Ref Range Status  05/28/2022 240 (H) <150 mg/dL Final    Comment:    . If a non-fasting specimen was collected, consider repeat triglyceride testing on a fasting specimen if clinically indicated.  Yates Decamp et al. J. of Clin. Lipidol. [id number]. Marland Kitchen          Passed - Patient is not pregnant      Passed - Valid encounter within last 12 months    Recent Outpatient Visits           1 month ago Mixed hyperlipidemia   [facility] [place], [place], NP   5 months ago Acute bacterial sinusitis   [facility] [place], [place], NP   11 months ago Irritable bowel syndrome with both constipation and diarrhea   [facility] [place], Coralie Keens, NP   1 year ago Diarrhea, unspecified type   [facility] [place], Coralie Keens, NP   1 year ago Medicare annual wellness visit, subsequent   [facility] [place], [place]ie Keens, [place]

## 2022-07-17 NOTE — Telephone Encounter (Signed)
Requested Prescriptions  Pending Prescriptions Disp Refills   fluticasone (FLONASE) 50 MCG/ACT nasal spray [Pharmacy Med Name: FLUTICASONE PROP 50 MCG SPRAY] 48 mL 0    Sig: SPRAY 2 SPRAYS INTO EACH NOSTRIL EVERY DAY     Ear, Nose, and Throat: Nasal Preparations - Corticosteroids Passed - 07/16/2022  3:40 PM      Passed - Valid encounter within last 12 months    Recent Outpatient Visits           1 month ago Mixed hyperlipidemia   [facility] [place], [place] W, NP   5 months ago Acute bacterial sinusitis   [facility] [place], [place], NP   11 months ago Irritable bowel syndrome with both constipation and diarrhea   [facility] [place], Coralie Keens, NP   1 year ago Diarrhea, unspecified type   [facility] [place], Coralie Keens, NP   1 year ago Medicare annual wellness visit, subsequent   [facility] [place], [place] W, NP               hyoscyamine (LEVSIN) 0.125 MG tablet [Pharmacy Med Name: HYOSCYAMINE SULF 0.125 MG TAB] 30 tablet 0    Sig: TAKE 1 TABLET (0.125 MG TOTAL) BY MOUTH EVERY 4 (FOUR) HOURS AS NEEDED.     Gastroenterology:  Antispasmodic Agents Passed - 07/16/2022  3:40 PM      Passed - Valid encounter within last 12 months    Recent Outpatient Visits           1 month ago Mixed hyperlipidemia   [facility] [place], [place] W, NP   5 months ago Acute bacterial sinusitis   [facility] [place], [place] W, NP   11 months ago Irritable bowel syndrome with both constipation and diarrhea   [facility] [place], Coralie Keens, NP   1 year ago Diarrhea, unspecified type   [facility] [place], Coralie Keens, NP   1 year ago Medicare annual wellness visit, subsequent   [facility] [place], Coralie Keens, NP              Refused  Prescriptions Disp Refills   metoprolol succinate (TOPROL-XL) 50 MG 24 hr tablet [Pharmacy Med Name: METOPROLOL SUCC ER 50 MG TAB] 90 tablet 0    Sig: TAKE 1 TABLET BY MOUTH EVERY DAY WITH OR IMMEDIATELY FOLLOWING A MEAL     Cardiovascular:  Beta Blockers Passed - 07/16/2022  3:40 PM      Passed - Last BP in normal range    BP Readings from Last 1 Encounters:  05/28/22 134/80         Passed - Last Heart Rate in normal range    Pulse Readings from Last 1 Encounters:  05/28/22 70         Passed - Valid encounter within last 6 months    Recent Outpatient Visits           1 month ago Mixed hyperlipidemia   [facility] [place], Coralie Keens, NP   5 months ago Acute bacterial sinusitis   [facility] [place], [place], NP   11 months ago Irritable bowel syndrome with both constipation and diarrhea   [facility] [place], [place],  NP   1 year ago Diarrhea, unspecified type   [facility] [place], Coralie Keens, NP   1 year ago Medicare annual wellness visit, subsequent   [facility] [place], Coralie Keens, NP               atorvastatin (LIPITOR) 20 MG tablet [Pharmacy Med Name: ATORVASTATIN 20 MG TABLET] 90 tablet 1    Sig: TAKE 1 TABLET BY MOUTH EVERY DAY     Cardiovascular:  Antilipid - Statins Failed - 07/16/2022  3:40 PM      Failed - Lipid Panel in normal range within the last 12 months    Cholesterol, Total  Date Value Ref Range Status  10/11/2016 220 (H) 100 - 199 mg/dL Final   Cholesterol  Date Value Ref Range Status  05/28/2022 170 <200 mg/dL Final   LDL Cholesterol (Calc)  Date Value Ref Range Status  05/28/2022 98 mg/dL (calc) Final    Comment:    Reference range: <100 . Desirable range <100 mg/dL for primary prevention;   <70 mg/dL for patients with CHD or diabetic patients  with > or = 2 CHD risk factors. Marland Kitchen LDL-C is now calculated using the  Martin-Hopkins  calculation, which is a validated novel method providing  better accuracy than the Friedewald equation in the  estimation of LDL-C.  Cresenciano Genre et al. Annamaria Helling. [id number]): 2061-2068  (http://education.[facility].com/faq/FAQ164)    HDL  Date Value Ref Range Status  05/28/2022 36 (L) > OR = 50 mg/dL Final  10/11/2016 42 >39 mg/dL Final   Triglycerides  Date Value Ref Range Status  05/28/2022 240 (H) <150 mg/dL Final    Comment:    . If a non-fasting specimen was collected, consider repeat triglyceride testing on a fasting specimen if clinically indicated.  Yates Decamp et al. J. of Clin. Lipidol. [id number]. Marland Kitchen          Passed - Patient is not pregnant      Passed - Valid encounter within last 12 months    Recent Outpatient Visits           1 month ago Mixed hyperlipidemia   [facility] [place], [place] W, NP   5 months ago Acute bacterial sinusitis   [facility] [place], [place], [place]   11 months ago Irritable bowel syndrome with both constipation and diarrhea   [facility] [place], Coralie Keens, NP   1 year ago Diarrhea, unspecified type   [facility] [place], Coralie Keens, NP   1 year ago Medicare annual wellness visit, subsequent   [facility] [place], [place]alie Keens, [place]

## 2022-08-06 DIAGNOSIS — K219 Gastro-esophageal reflux disease without esophagitis: Secondary | ICD-10-CM | POA: Diagnosis not present

## 2022-08-06 DIAGNOSIS — R1012 Left upper quadrant pain: Secondary | ICD-10-CM | POA: Diagnosis not present

## 2022-08-06 DIAGNOSIS — Z86010 Personal history of colonic polyps: Secondary | ICD-10-CM | POA: Diagnosis not present

## 2022-08-15 ENCOUNTER — Other Ambulatory Visit: Payer: Self-pay | Admitting: Internal Medicine

## 2022-08-15 DIAGNOSIS — I10 Essential (primary) hypertension: Secondary | ICD-10-CM

## 2022-08-15 NOTE — Telephone Encounter (Signed)
Requested Prescriptions  Pending Prescriptions Disp Refills   metoprolol succinate (TOPROL-XL) 50 MG 24 hr tablet [Pharmacy Med Name: METOPROLOL SUCC ER 50 MG TAB] 90 tablet 0    Sig: TAKE 1 TABLET BY MOUTH EVERY DAY WITH OR IMMEDIATELY FOLLOWING A MEAL     Cardiovascular:  Beta Blockers Passed - 08/15/2022  3:42 PM      Passed - Last BP in normal range    BP Readings from Last 1 Encounters:  05/28/22 134/80         Passed - Last Heart Rate in normal range    Pulse Readings from Last 1 Encounters:  05/28/22 70         Passed - Valid encounter within last 6 months    Recent Outpatient Visits           2 months ago Mixed hyperlipidemia   [facility] [place], [place], NP   6 months ago Acute bacterial sinusitis   [facility] [place], [place], NP   1 year ago Irritable bowel syndrome with both constipation and diarrhea   [facility] [place], Salvadore [place], NP   1 year ago Diarrhea, unspecified type   [facility] [place], Salvadore Oxford, NP   1 year ago Medicare annual wellness visit, subsequent   [facility] [place], Salvadore Oxford, [place]

## 2022-08-20 ENCOUNTER — Ambulatory Visit
Admission: RE | Admit: 2022-08-20 | Discharge: 2022-08-20 | Disposition: A | Discharge status: Home / Self Care | Payer: Medicare HMO | Source: Ambulatory Visit | Attending: Internal Medicine | Admitting: Internal Medicine

## 2022-08-20 DIAGNOSIS — Z1231 Encounter for screening mammogram for malignant neoplasm of breast: Secondary | ICD-10-CM | POA: Diagnosis not present

## 2022-08-21 ENCOUNTER — Encounter: Payer: Self-pay | Admitting: Oncology

## 2022-08-21 ENCOUNTER — Inpatient Hospital Stay (HOSPITAL_BASED_OUTPATIENT_CLINIC_OR_DEPARTMENT_OTHER): Payer: Medicare HMO | Admitting: Oncology

## 2022-08-21 ENCOUNTER — Inpatient Hospital Stay: Payer: Medicare HMO | Attending: Oncology

## 2022-08-21 VITALS — BP 141/81 | HR 76 | Temp 97.5°F | Resp 18 | Wt 150.4 lb

## 2022-08-21 DIAGNOSIS — I10 Essential (primary) hypertension: Secondary | ICD-10-CM | POA: Diagnosis not present

## 2022-08-21 DIAGNOSIS — D473 Essential (hemorrhagic) thrombocythemia: Secondary | ICD-10-CM

## 2022-08-21 DIAGNOSIS — Z79899 Other long term (current) drug therapy: Secondary | ICD-10-CM | POA: Diagnosis not present

## 2022-08-21 LAB — CBC WITH DIFFERENTIAL/PLATELET
Abs Immature Granulocytes: 0.05 10*3/uL (ref 0.00–0.07)
Basophils Absolute: 0.1 10*3/uL (ref 0.0–0.1)
Basophils Relative: 1 %
Eosinophils Absolute: 0.2 10*3/uL (ref 0.0–0.5)
Eosinophils Relative: 2 %
HCT: 38.6 % (ref 36.0–46.0)
Hemoglobin: 13.3 g/dL (ref 12.0–15.0)
Immature Granulocytes: 1 %
Lymphocytes Relative: 32 %
Lymphs Abs: 2.5 10*3/uL (ref 0.7–4.0)
MCH: 38.6 pg — ABNORMAL HIGH (ref 26.0–34.0)
MCHC: 34.5 g/dL (ref 30.0–36.0)
MCV: 111.9 fL — ABNORMAL HIGH (ref 80.0–100.0)
Monocytes Absolute: 0.5 10*3/uL (ref 0.1–1.0)
Monocytes Relative: 7 %
Neutro Abs: 4.3 10*3/uL (ref 1.7–7.7)
Neutrophils Relative %: 57 %
Platelets: 440 10*3/uL — ABNORMAL HIGH (ref 150–400)
RBC: 3.45 MIL/uL — ABNORMAL LOW (ref 3.87–5.11)
RDW: 13.7 % (ref 11.5–15.5)
WBC: 7.6 10*3/uL (ref 4.0–10.5)
nRBC: 0 % (ref 0.0–0.2)

## 2022-08-21 LAB — COMPREHENSIVE METABOLIC PANEL
ALT: 16 U/L (ref 0–44)
AST: 20 U/L (ref 15–41)
Albumin: 3.5 g/dL (ref 3.5–5.0)
Alkaline Phosphatase: 125 U/L (ref 38–126)
Anion gap: 5 (ref 5–15)
BUN: 16 mg/dL (ref 8–23)
CO2: 27 mmol/L (ref 22–32)
Calcium: 8.6 mg/dL — ABNORMAL LOW (ref 8.9–10.3)
Chloride: 104 mmol/L (ref 98–111)
Creatinine, Ser: 0.92 mg/dL (ref 0.44–1.00)
GFR, Estimated: >60 mL/min (ref >60)
Glucose, Bld: 85 mg/dL (ref 70–99)
Potassium: 4.4 mmol/L (ref 3.5–5.1)
Sodium: 136 mmol/L (ref 135–145)
Total Bilirubin: 0.8 mg/dL (ref 0.3–1.2)
Total Protein: 6.5 g/dL (ref 6.5–8.1)

## 2022-08-21 MED ORDER — HYDROXYUREA 500 MG PO CAPS: 500.0000 mg | ORAL_CAPSULE | ORAL | 2 refills | Status: DC

## 2022-08-21 NOTE — Assessment & Plan Note (Signed)
Recommend patient to take calcium supplementation.  

## 2022-08-21 NOTE — Assessment & Plan Note (Addendum)
Labs reviewed and discussed with patient.  Platelet count is 440,000. continue hydroxyurea to 1000mg  1 day per week, and 500mg  daily the rest of the days.

## 2022-08-21 NOTE — Progress Notes (Signed)
Hematology/Oncology Progress note [phone] [phone]      Patient Care Team: Lorre Munroe, NP as PCP - General (Internal Medicine)  ASSESSMENT & PLAN:   Essential thrombocythemia [facility]) Labs reviewed and discussed with patient.  Platelet count is 440,000. continue hydroxyurea to 1000mg  1 day per week, and 500mg  daily the rest of the days.   Hypocalcemia Recommend patient to take calcium supplementation.   Orders Placed This Encounter  Procedures   CBC with Differential (Cancer Center Only)    Standing Status:   Future    Standing Expiration Date:   08/21/2023   CMP (Cancer Center only)    Standing Status:   Future    Standing Expiration Date:   08/21/2023    Follow up in 3 months.   All questions were answered. The patient knows to call the clinic with any problems, questions or concerns.  Rickard Patience, MD, PhD [facility] Hematology Oncology 08/21/2022    CHIEF COMPLAINTS/REASON FOR VISIT:  Essential thrombocythemia  HISTORY OF PRESENTING ILLNESS:  Tiffany Richardson is a 77 y.o. female who was seen in consultation at the request of Baity, Salvadore Oxford, NP for evaluation of thromcbocytosis Reviewed patient's labs.  08/07/2020 labs showed elevated platelet counts at 904,000.  Normal wbc  and hemoglobin   Reviewed patient's previous labs. Thrombocytosis onset is chronic onset , duration since at least 2017 No aggravating or elevated factors. Associated symptoms or signs:  Denies weight loss, fever, chills, fatigue, night sweats.  Context:  Smoking history: Denies Family history of polycythemia.  Denies.  However his father had a history of needing frequent blood removal History of iron deficiency anemia: Denies History of DVT: Denies She also reports chronic abdominal bloating.  She has IBS.  Denies any unintentional weight loss, fever, nausea vomiting.  No recent colonoscopy in current EMR. She is widowed, husband had colon cancer.  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  Consistent with essential thrombocythemia, iron deficient polycythemia vera, pretty fibrotic/early primary myelofibrosis. Patient has no erythrocytosis.  No chronic leukocytosis.  Clinically most consistent with essential thrombocythemia.  # Chronic bloating and abdominal discomfort, possible due to chronic IBS symptoms.  Last colonoscopy 2013. Patient was seen by Dr. Mechele Collin in 2019 and was recommended to increase soluble fiber, MiraLAX, if not helpful trial of FODMAP diet and probiotics.  Discussed with patient that I recommend surveillance colonoscopy.  07/06/2021, CT scan showed constipation.  Otherwise no acute intra-a patient bdominal or intrapelvic abnormality. Reports alternate of constipation and diarrhea.  Not associated with any food or medication.  INTERVAL HISTORY Tiffany Richardson is a 77 y.o. female who has above history reviewed by me today presents for follow up visit for management of essential thrombocythemia Patient has been on  hydroxyurea to 1000mg  1 day per week, and 500mg  daily the rest of the days.   Patient tolerates well. Stomach discomfort has significantly improved after seeing GI and prescribed sucralfate.    Review of Systems  Constitutional:  Negative for appetite change, chills, fatigue and fever.  HENT:   Negative for hearing loss and voice change.   Eyes:  Negative for eye problems.  Respiratory:  Negative for chest tightness and cough.   Cardiovascular:  Negative for chest pain.  Gastrointestinal:  Negative for abdominal distention, abdominal pain and blood in stool.  Endocrine: Negative for hot flashes.  Genitourinary:  Negative for difficulty  urinating and frequency.   Musculoskeletal:  Negative for arthralgias.  Skin:  Negative for itching and rash.  Neurological:  Negative for extremity weakness.  Hematological:   Negative for adenopathy.  Psychiatric/Behavioral:  Negative for confusion.     MEDICAL HISTORY:  Past Medical History:  Diagnosis Date   Allergy    Cancer (HCC)    basal cell   Emphysema of lung (HCC)    Hypertension     SURGICAL HISTORY: Past Surgical History:  Procedure Laterality Date   basal cell removed     Rt eye   squamous cell removed       SOCIAL HISTORY: Social History   Socioeconomic History   Marital status: Widowed    Spouse name: Not on file   Number of children: Not on file   Years of education: Not on file   Highest education level: Not on file  Occupational History   Not on file  Tobacco Use   Smoking status: Never   Smokeless tobacco: Never  Vaping Use   Vaping Use: Never used  Substance and Sexual Activity   Alcohol use: No    Alcohol/week: 0.0 standard drinks of alcohol   Drug use: No   Sexual activity: Not on file  Other Topics Concern   Not on file  Social History Narrative   Not on file   Social Determinants of Health   Financial Resource Strain: Not on file  Food Insecurity: Not on file  Transportation Needs: Not on file  Physical Activity: Not on file  Stress: Not on file  Social Connections: Not on file  Intimate Partner Violence: Not on file    FAMILY HISTORY: Family History  Problem Relation Age of Onset   Leukemia Mother    Heart disease Father    Non-Hodgkin's lymphoma Daughter     ALLERGIES:  is allergic to codeine.  MEDICATIONS:  Current Outpatient Medications  Medication Sig Dispense Refill   aspirin EC 81 MG tablet Take 81 mg by mouth.     atorvastatin (LIPITOR) 40 MG tablet Take 1 tablet (40 mg total) by mouth daily. 90 tablet 1   fluticasone (FLONASE) 50 MCG/ACT nasal spray SPRAY 2 SPRAYS INTO EACH NOSTRIL EVERY DAY 48 mL 0   hyoscyamine (LEVSIN) 0.125 MG tablet TAKE 1 TABLET (0.125 MG TOTAL) BY MOUTH EVERY 4 (FOUR) HOURS AS NEEDED. 30 tablet 0   metoprolol succinate (TOPROL-XL) 50 MG 24 hr tablet TAKE 1  TABLET BY MOUTH EVERY DAY WITH OR IMMEDIATELY FOLLOWING A MEAL 90 tablet 0   sucralfate (CARAFATE) 1 g tablet Take by mouth.     hydroxyurea (HYDREA) 500 MG capsule Take 1 capsule (500 mg total) by mouth See admin instructions. Take 1000mg  on Mondays, and take 500mg  daily for the rest of the week. 94 capsule 2   No current facility-administered medications for this visit.     PHYSICAL EXAMINATION: ECOG PERFORMANCE STATUS: 0 - Asymptomatic Vitals:   08/21/22 1358  BP: (!) 141/81  Pulse: 76  Resp: 18  Temp: (!) 97.5 F (36.4 C)  SpO2: 99%   Filed Weights   08/21/22 1358  Weight: 150 lb 6.4 oz (68.2 kg)    Physical Exam Constitutional:      General: She is not in acute distress. HENT:     Head: Normocephalic.  Eyes:     General: No scleral icterus. Cardiovascular:     Rate and Rhythm: Normal rate.  Pulmonary:     Effort: Pulmonary effort is  normal. No respiratory distress.  Abdominal:     General: There is no distension.  Musculoskeletal:        General: No deformity. Normal range of motion.     Cervical back: Normal range of motion.  Skin:    General: Skin is warm and dry.     Findings: No erythema or rash.  Neurological:     Mental Status: She is alert and oriented to person, place, and time. Mental status is at baseline.     Cranial Nerves: No cranial nerve deficit.     Coordination: Coordination normal.  Psychiatric:        Mood and Affect: Mood normal.      LABORATORY DATA:  I have reviewed the data as listed    Latest Ref Rng & Units 08/21/2022    1:49 PM 05/21/2022    2:14 PM 04/01/2022    8:07 AM  CBC  WBC 4.0 - 10.5 K/uL 7.6  7.0  6.5   Hemoglobin 12.0 - 15.0 g/dL 16.1  09.6  04.5   Hematocrit 36.0 - 46.0 % 38.6  40.8  40.1   Platelets 150 - 400 K/uL 440  490  480       Latest Ref Rng & Units 08/21/2022    1:49 PM 05/21/2022    2:14 PM 02/18/2022    9:28 AM  CMP  Glucose 70 - 99 mg/dL 85  94  85   BUN 8 - 23 mg/dL 16  18  13    Creatinine 0.44  - 1.00 mg/dL 4.09  8.11  9.14   Sodium 135 - 145 mmol/L 136  138  136   Potassium 3.5 - 5.1 mmol/L 4.4  4.4  4.5   Chloride 98 - 111 mmol/L 104  101  104   CO2 22 - 32 mmol/L 27  27  29    Calcium 8.9 - 10.3 mg/dL 8.6  8.8  8.7   Total Protein 6.5 - 8.1 g/dL 6.5  7.2  7.2   Total Bilirubin 0.3 - 1.2 mg/dL 0.8  0.8  1.0   Alkaline Phos 38 - 126 U/L 125  126  131   AST 15 - 41 U/L 20  23  23    ALT 0 - 44 U/L 16  19  19     Lab Results  Component Value Date   IRON 85 08/15/2020   TIBC 337 08/15/2020   FERRITIN 31 08/15/2020       RADIOGRAPHIC STUDIES: I have personally reviewed the radiological images as listed and agreed with the findings in the report. MM 3D SCREEN BREAST BILATERAL  Result Date: 08/21/2022 CLINICAL DATA:  Screening. EXAM: DIGITAL SCREENING BILATERAL MAMMOGRAM WITH TOMOSYNTHESIS AND CAD TECHNIQUE: Bilateral screening digital craniocaudal and mediolateral oblique mammograms were obtained. Bilateral screening digital breast tomosynthesis was performed. The images were evaluated with computer-aided detection. COMPARISON:  Previous exam(s). ACR Breast Density Category b: There are scattered areas of fibroglandular density. FINDINGS: There are no findings suspicious for malignancy. IMPRESSION: No mammographic evidence of malignancy. A result letter of this screening mammogram will be mailed directly to the patient. RECOMMENDATION: Screening mammogram in one year. (Code:SM-B-01Y) BI-RADS CATEGORY  1: Negative. Electronically Signed   By: Baird Lyons M.D.   On: 08/21/2022 16:35

## 2022-09-02 ENCOUNTER — Other Ambulatory Visit: Payer: Medicare Other

## 2022-09-02 DIAGNOSIS — E782 Mixed hyperlipidemia: Secondary | ICD-10-CM

## 2022-09-03 ENCOUNTER — Other Ambulatory Visit: Payer: Self-pay | Admitting: Internal Medicine

## 2022-09-03 LAB — LIPID PANEL
Cholesterol: 124 mg/dL (ref <200)
HDL: 34 mg/dL — ABNORMAL LOW (ref >=50)
LDL Cholesterol (Calc): 65 mg/dL (calc)
Non-HDL Cholesterol (Calc): 90 mg/dL (calc) (ref <130)
Total CHOL/HDL Ratio: 3.6 (calc) (ref <5.0)
Triglycerides: 168 mg/dL — ABNORMAL HIGH (ref <150)

## 2022-09-03 NOTE — Telephone Encounter (Signed)
Unable to refill per protocol, Rx expired. Discontinued 08/21/22.  Requested Prescriptions  Pending Prescriptions Disp Refills   pantoprazole (PROTONIX) 40 MG tablet [Pharmacy Med Name: PANTOPRAZOLE SOD DR 40 MG TAB] 90 tablet 0    Sig: TAKE 1 TABLET BY MOUTH EVERY DAY     Gastroenterology: Proton Pump Inhibitors Passed - 09/03/2022 11:19 AM      Passed - Valid encounter within last 12 months    Recent Outpatient Visits           3 months ago Mixed hyperlipidemia   [facility] [place], [place] W, NP   7 months ago Acute bacterial sinusitis   [facility] [place], [place] W, NP   1 year ago Irritable bowel syndrome with both constipation and diarrhea    [facility] [place], Salvadore [place], NP   1 year ago Diarrhea, unspecified type   [facility] [place], Salvadore Oxford, NP   1 year ago Medicare annual wellness visit, subsequent   [facility] [place], Salvadore Oxford, [place]

## 2022-10-03 ENCOUNTER — Ambulatory Visit: Admit: 2022-10-03 | Payer: PRIVATE HEALTH INSURANCE | Admitting: Gastroenterology

## 2022-10-27 ENCOUNTER — Other Ambulatory Visit: Payer: Self-pay | Admitting: Internal Medicine

## 2022-10-27 DIAGNOSIS — J3089 Other allergic rhinitis: Secondary | ICD-10-CM

## 2022-10-28 NOTE — Telephone Encounter (Signed)
Requested Prescriptions  Pending Prescriptions Disp Refills   fluticasone (FLONASE) 50 MCG/ACT nasal spray [Pharmacy Med Name: FLUTICASONE PROP 50 MCG SPRAY] 48 mL 0    Sig: SPRAY 2 SPRAYS INTO EACH NOSTRIL EVERY DAY     Ear, Nose, and Throat: Nasal Preparations - Corticosteroids Passed - 10/27/2022  8:37 AM      Passed - Valid encounter within last 12 months    Recent Outpatient Visits           5 months ago Mixed hyperlipidemia   [facility] [place], Salvadore Oxford, NP   9 months ago Acute bacterial sinusitis   [facility] [place], [place], NP   1 year ago Irritable bowel syndrome with both constipation and diarrhea    [facility] [place], Salv[place], NP   1 year ago Diarrhea, unspecified type   [facility] [place], Salvadore Oxford, NP   1 year ago Medicare annual wellness visit, subsequent   [facility] [place], Salvadore Oxford, [place]

## 2022-11-16 ENCOUNTER — Other Ambulatory Visit: Payer: Self-pay | Admitting: Internal Medicine

## 2022-11-18 ENCOUNTER — Other Ambulatory Visit: Payer: Self-pay | Admitting: Internal Medicine

## 2022-11-18 NOTE — Telephone Encounter (Signed)
Labs in date  Requested Prescriptions  Pending Prescriptions Disp Refills   atorvastatin (LIPITOR) 40 MG tablet [Pharmacy Med Name: ATORVASTATIN 40 MG TABLET] 90 tablet 1    Sig: TAKE 1 TABLET BY MOUTH EVERY DAY     Cardiovascular:  Antilipid - Statins Failed - 11/16/2022  9:27 AM      Failed - Lipid Panel in normal range within the last 12 months    Cholesterol, Total  Date Value Ref Range Status  10/11/2016 220 (H) 100 - 199 mg/dL Final   Cholesterol  Date Value Ref Range Status  09/02/2022 124 <200 mg/dL Final   LDL Cholesterol (Calc)  Date Value Ref Range Status  09/02/2022 65 mg/dL (calc) Final    Comment:    Reference range: <100 . Desirable range <100 mg/dL for primary prevention;   <70 mg/dL for patients with CHD or diabetic patients  with > or = 2 CHD risk factors. Marland Kitchen LDL-C is now calculated using the Martin-Hopkins  calculation, which is a validated novel method providing  better accuracy than the Friedewald equation in the  estimation of LDL-C.  Horald Pollen et al. Lenox Ahr. 1610;960(45): 2061-2068  (http://education.[facility].com/faq/FAQ164)    HDL  Date Value Ref Range Status  09/02/2022 34 (L) > OR = 50 mg/dL Final  40/98/1191 42 >47 mg/dL Final   Triglycerides  Date Value Ref Range Status  09/02/2022 168 (H) <150 mg/dL Final         Passed - Patient is not pregnant      Passed - Valid encounter within last 12 months    Recent Outpatient Visits           5 months ago Mixed hyperlipidemia   [facility] [facility] [place], Salvadore Oxford, NP   9 months ago Acute bacterial sinusitis   [facility] [place], [place], NP   1 year ago Irritable bowel syndrome with both constipation and diarrhea   [facility] [place], Salvadore Oxford, NP   1 year ago Diarrhea, unspecified type   [facility] [place], Salvadore Oxford, NP   1 year ago Medicare annual wellness visit, subsequent    [facility] [place], Salvadore Oxford, [place]

## 2022-11-19 NOTE — Telephone Encounter (Signed)
Requested Prescriptions  Refused Prescriptions Disp Refills   omeprazole (PRILOSEC) 40 MG capsule [Pharmacy Med Name: OMEPRAZOLE DR 40 MG CAPSULE] 180 capsule 1    Sig: TAKE 1 CAPSULE (40 MG TOTAL) BY MOUTH IN THE MORNING AND AT BEDTIME.     Gastroenterology: Proton Pump Inhibitors Passed - 11/18/2022  2:31 AM      Passed - Valid encounter within last 12 months    Recent Outpatient Visits           5 months ago Mixed hyperlipidemia   [place] [facility] [place], [place], NP   9 months ago Acute bacterial sinusitis   [place] [place], [place], NP   1 year ago Irritable bowel syndrome with both constipation and diarrhea   [facility] [place], Salvadore Oxford, NP   1 year ago Diarrhea, unspecified type   [facility] [place], Salvadore Oxford, NP   1 year ago Medicare annual wellness visit, subsequent   [facility] [place], Salvadore Oxford, [place]

## 2022-11-24 ENCOUNTER — Other Ambulatory Visit: Payer: Self-pay | Admitting: Internal Medicine

## 2022-11-24 DIAGNOSIS — I10 Essential (primary) hypertension: Secondary | ICD-10-CM

## 2022-11-24 DIAGNOSIS — J3089 Other allergic rhinitis: Secondary | ICD-10-CM

## 2022-11-24 DIAGNOSIS — E782 Mixed hyperlipidemia: Secondary | ICD-10-CM

## 2022-11-25 NOTE — Telephone Encounter (Signed)
Pantoprazole D/C 08/21/22 J. Socorro CMA Atorvastatin D/C 05/30/22 R. Baity NP Omeprazole D/C 08/31/22 J. Glenwood CMA   Requested Prescriptions  Signed Prescriptions Disp Refills   metoprolol succinate (TOPROL-XL) 50 MG 24 hr tablet 90 tablet 0    Sig: TAKE 1 TABLET BY MOUTH EVERY DAY WITH OR IMMEDIATELY FOLLOWING A MEAL     Cardiovascular:  Beta Blockers Failed - 11/24/2022  1:43 PM      Failed - Last BP in normal range    BP Readings from Last 1 Encounters:  08/21/22 (!) 141/81         Passed - Last Heart Rate in normal range    Pulse Readings from Last 1 Encounters:  08/21/22 76         Passed - Valid encounter within last 6 months    Recent Outpatient Visits           6 months ago Mixed hyperlipidemia   [place] [facility] [place], [place], NP   10 months ago Acute bacterial sinusitis   [place] [facility] [place], [place] W, NP   1 year ago Irritable bowel syndrome with both constipation and diarrhea   [facility] [place], [place], NP   1 year ago Diarrhea, unspecified type   [place] [place], Salvadore [place], NP   1 year ago Medicare annual wellness visit, subsequent   [facility] [place], [place] W, NP               fluticasone [facility]) 50 MCG/ACT nasal spray 48 mL 0    Sig: SPRAY 2 SPRAYS INTO EACH NOSTRIL EVERY DAY     Ear, Nose, and Throat: Nasal Preparations - Corticosteroids Passed - 11/24/2022  1:43 PM      Passed - Valid encounter within last 12 months    Recent Outpatient Visits           6 months ago Mixed hyperlipidemia   [place] [facility] [place], [place], NP   10 months ago Acute bacterial sinusitis   [place] [facility] [place], [place], NP   1 year ago Irritable bowel syndrome with both constipation and diarrhea   [place] [place], Salvadore [place], NP   1 year ago Diarrhea,  unspecified type   [place] [facility] [place], Salvadore [place], NP   1 year ago Medicare annual wellness visit, subsequent   [place] [facility] [place], [place], NP              Refused Prescriptions Disp Refills   pantoprazole (PROTONIX) 40 MG tablet [Pharmacy Med Name: PANTOPRAZOLE SOD DR 40 MG TAB] 90 tablet     Sig: TAKE 1 TABLET BY MOUTH EVERY DAY     Gastroenterology: Proton Pump Inhibitors Passed - 11/24/2022  1:43 PM      Passed - Valid encounter within last 12 months    Recent Outpatient Visits           6 months ago Mixed hyperlipidemia   [place] [facility] [place], Salv[place]ore [place], NP   10 months ago Acute bacterial sinusitis   [place] [facility] [place], [place], NP   1 year ago Irritable bowel syndrome with both constipation and diarrhea   [place] [facility] [place], Salv[place], [place]  1 year ago Diarrhea, unspecified type   [place] [facility] [place], Salvadore [place], NP   1 year ago Medicare annual wellness visit, subsequent   [facility] [place], Salv[place], NP               atorvastatin (LIPITOR) 20 MG tablet [Pharmacy Med Name: ATORVASTATIN 20 MG TABLET] 90 tablet     Sig: TAKE 1 TABLET BY MOUTH EVERY DAY     Cardiovascular:  Antilipid - Statins Failed - 11/24/2022  1:43 PM      Failed - Lipid Panel in normal range within the last 12 months    Cholesterol, Total  Date Value Ref Range Status  10/11/2016 220 (H) 100 - 199 mg/dL Final   Cholesterol  Date Value Ref Range Status  09/02/2022 124 <200 mg/dL Final   LDL Cholesterol (Calc)  Date Value Ref Range Status  09/02/2022 65 mg/dL (calc) Final    Comment:    Reference range: <100 . Desirable range <100 mg/dL for primary prevention;   <70 mg/dL for patients with CHD or diabetic patients  with > or = 2 CHD risk factors. Marland Kitchen LDL-C is now calculated using the Martin-Hopkins   calculation, which is a validated novel method providing  better accuracy than the Friedewald equation in the  estimation of LDL-C.  Horald Pollen et al. Lenox Ahr. 0981;191(47): 2061-2068  (http://education.[facility].com/faq/FAQ164)    HDL  Date Value Ref Range Status  09/02/2022 34 (L) > OR = 50 mg/dL Final  82/95/6213 42 >08 mg/dL Final   Triglycerides  Date Value Ref Range Status  09/02/2022 168 (H) <150 mg/dL Final         Passed - Patient is not pregnant      Passed - Valid encounter within last 12 months    Recent Outpatient Visits           6 months ago Mixed hyperlipidemia   [place] [facility] [place], [place], NP   10 months ago Acute bacterial sinusitis   [place] [facility] [place], [place], NP   1 year ago Irritable bowel syndrome with both constipation and diarrhea   [place] [facility] [place], Salvad[place], NP   1 year ago Diarrhea, unspecified type   [facility] [place], Salv[place], NP   1 year ago Medicare annual wellness visit, subsequent   [facility] [place], Salv[place], NP               omeprazole (PRILOSEC) 40 MG capsule [Pharmacy Med Name: OMEPRAZOLE DR 40 MG CAPSULE] 180 capsule     Sig: TAKE 1 CAPSULE (40 MG TOTAL) BY MOUTH IN THE MORNING AND AT BEDTIME.     Gastroenterology: Proton Pump Inhibitors Passed - 11/24/2022  1:43 PM      Passed - Valid encounter within last 12 months    Recent Outpatient Visits           6 months ago Mixed hyperlipidemia   [facility] [place], Salv[place], NP   10 months ago Acute bacterial sinusitis   [place] [facility] [place], Salv[place], NP   1 year ago Irritable bowel syndrome with both constipation and diarrhea   [place] [facility] [place], Salv[place], NP   1 year ago Diarrhea, unspecified type   [facility] [place],  Salvad[place], NP   1 year ago Medicare annual wellness visit, subsequent   [facility] [place], Salv[place], [place]

## 2022-11-25 NOTE — Telephone Encounter (Signed)
Requested Prescriptions  Pending Prescriptions Disp Refills   metoprolol succinate (TOPROL-XL) 50 MG 24 hr tablet [Pharmacy Med Name: METOPROLOL SUCC ER 50 MG TAB] 90 tablet 0    Sig: TAKE 1 TABLET BY MOUTH EVERY DAY WITH OR IMMEDIATELY FOLLOWING A MEAL     Cardiovascular:  Beta Blockers Failed - 11/24/2022  1:43 PM      Failed - Last BP in normal range    BP Readings from Last 1 Encounters:  08/21/22 (!) 141/81         Passed - Last Heart Rate in normal range    Pulse Readings from Last 1 Encounters:  08/21/22 76         Passed - Valid encounter within last 6 months    Recent Outpatient Visits           6 months ago Mixed hyperlipidemia   [place] [place], [place], NP   10 months ago Acute bacterial sinusitis   [place] [facility] [place], [place], NP   1 year ago Irritable bowel syndrome with both constipation and diarrhea   [place] [facility] [place], Salv[place], NP   1 year ago Diarrhea, unspecified type   [facility] [facility] [place], [place], NP   1 year ago Medicare annual wellness visit, subsequent   [place] [facility] [place], Salvadore [place], NP               pantoprazole (PROTONIX) 40 MG tablet [Pharmacy Med Name: PANTOPRAZOLE SOD DR 40 MG TAB] 90 tablet     Sig: TAKE 1 TABLET BY MOUTH EVERY DAY     Gastroenterology: Proton Pump Inhibitors Passed - 11/24/2022  1:43 PM      Passed - Valid encounter within last 12 months    Recent Outpatient Visits           6 months ago Mixed hyperlipidemia   [place] [place], [place], NP   10 months ago Acute bacterial sinusitis   [facility] [place], [place], NP   1 year ago Irritable bowel syndrome with both constipation and diarrhea   [place] [place], Salv[place], NP   1 year ago Diarrhea, unspecified type   [facility] [place], Salv[place], NP   1 year ago Medicare annual wellness visit, subsequent    [facility] [place], [place], NP               atorvastatin (LIPITOR) 20 MG tablet [Pharmacy Med Name: ATORVASTATIN 20 MG TABLET] 90 tablet     Sig: TAKE 1 TABLET BY MOUTH EVERY DAY     Cardiovascular:  Antilipid - Statins Failed - 11/24/2022  1:43 PM      Failed - Lipid Panel in normal range within the last 12 months    Cholesterol, Total  Date Value Ref Range Status  10/11/2016 220 (H) 100 - 199 mg/dL Final   Cholesterol  Date Value Ref Range Status  09/02/2022 124 <200 mg/dL Final   LDL Cholesterol (Calc)  Date Value Ref Range Status  09/02/2022 65 mg/dL (calc) Final    Comment:    Reference range: <100 . Desirable range <100 mg/dL for primary prevention;   <70 mg/dL for patients with CHD or diabetic patients  with > or = 2 CHD risk  factors. Marland Kitchen LDL-C is now calculated using the Martin-Hopkins  calculation, which is a validated novel method providing  better accuracy than the Friedewald equation in the  estimation of LDL-C.  Horald Pollen et al. Lenox Ahr. 1610;960(45): 2061-2068  (http://education.[facility].com/faq/FAQ164)    HDL  Date Value Ref Range Status  09/02/2022 34 (L) > OR = 50 mg/dL Final  40/98/1191 42 >47 mg/dL Final   Triglycerides  Date Value Ref Range Status  09/02/2022 168 (H) <150 mg/dL Final         Passed - Patient is not pregnant      Passed - Valid encounter within last 12 months    Recent Outpatient Visits           6 months ago Mixed hyperlipidemia   [place] [place], [place], NP   10 months ago Acute bacterial sinusitis   [place] [facility] [place], [place], NP   1 year ago Irritable bowel syndrome with both constipation and diarrhea   [place] [facility] [place], Salv[place], NP   1 year ago Diarrhea, unspecified type   [place] [facility] [place], Salv[place], NP   1 year ago Medicare annual wellness visit, subsequent   [place] [facility] [place], Salv[place], NP               fluticasone [facility]) 50 MCG/ACT nasal spray [Pharmacy Med Name: FLUTICASONE PROP 50 MCG SPRAY] 48 mL 0    Sig: SPRAY 2 SPRAYS INTO EACH NOSTRIL EVERY DAY     Ear, Nose, and Throat: Nasal Preparations - Corticosteroids Passed - 11/24/2022  1:43 PM      Passed - Valid encounter within last 12 months    Recent Outpatient Visits           6 months ago Mixed hyperlipidemia   [facility] [place], [place], NP   10 months ago Acute bacterial sinusitis   [place] [facility] [place], [place], NP   1 year ago Irritable bowel syndrome with both constipation and diarrhea   [place] [place], Salv[place], NP   1 year ago Diarrhea, unspecified type   [facility] [place], [place], NP   1 year ago Medicare annual wellness visit, subsequent   [place] [facility] [place], [place], NP               omeprazole (PRILOSEC) 40 MG capsule [Pharmacy Med Name: OMEPRAZOLE DR 40 MG CAPSULE] 180 capsule     Sig: TAKE 1 CAPSULE (40 MG TOTAL) BY MOUTH IN THE MORNING AND AT BEDTIME.     Gastroenterology: Proton Pump Inhibitors Passed - 11/24/2022  1:43 PM      Passed - Valid encounter within last 12 months    Recent Outpatient Visits           6 months ago Mixed hyperlipidemia   [place] [facility] [place], [place], NP   10 months ago Acute bacterial sinusitis    [facility] [place], [place], NP   1 year ago Irritable bowel syndrome with both constipation and diarrhea    [facility] [place], Salv[place], NP   1 year ago Diarrhea, unspecified type   [facility] [place], [place], [place]  1 year ago Medicare annual wellness visit, subsequent    [facility] [place], [place], [place]

## 2022-11-26 ENCOUNTER — Inpatient Hospital Stay: Payer: Medicare HMO | Attending: Oncology

## 2022-11-26 ENCOUNTER — Encounter: Payer: Self-pay | Admitting: Oncology

## 2022-11-26 ENCOUNTER — Inpatient Hospital Stay (HOSPITAL_BASED_OUTPATIENT_CLINIC_OR_DEPARTMENT_OTHER): Payer: Medicare HMO | Admitting: Oncology

## 2022-11-26 VITALS — BP 127/86 | HR 73 | Temp 97.9°F | Resp 19 | Wt 148.7 lb

## 2022-11-26 DIAGNOSIS — Z807 Family history of other malignant neoplasms of lymphoid, hematopoietic and related tissues: Secondary | ICD-10-CM | POA: Diagnosis not present

## 2022-11-26 DIAGNOSIS — D473 Essential (hemorrhagic) thrombocythemia: Secondary | ICD-10-CM

## 2022-11-26 DIAGNOSIS — R748 Abnormal levels of other serum enzymes: Secondary | ICD-10-CM | POA: Diagnosis not present

## 2022-11-26 DIAGNOSIS — Z806 Family history of leukemia: Secondary | ICD-10-CM | POA: Insufficient documentation

## 2022-11-26 LAB — CMP (CANCER CENTER ONLY)
ALT: 21 U/L (ref 0–44)
AST: 24 U/L (ref 15–41)
Albumin: 3.9 g/dL (ref 3.5–5.0)
Alkaline Phosphatase: 134 U/L — ABNORMAL HIGH (ref 38–126)
Anion gap: 7 (ref 5–15)
BUN: 12 mg/dL (ref 8–23)
CO2: 26 mmol/L (ref 22–32)
Calcium: 8.6 mg/dL — ABNORMAL LOW (ref 8.9–10.3)
Chloride: 105 mmol/L (ref 98–111)
Creatinine: 1.05 mg/dL — ABNORMAL HIGH (ref 0.44–1.00)
GFR, Estimated: 55 mL/min — ABNORMAL LOW (ref >60)
Glucose, Bld: 99 mg/dL (ref 70–99)
Potassium: 4.6 mmol/L (ref 3.5–5.1)
Sodium: 138 mmol/L (ref 135–145)
Total Bilirubin: 0.7 mg/dL (ref 0.3–1.2)
Total Protein: 6.8 g/dL (ref 6.5–8.1)

## 2022-11-26 LAB — CBC WITH DIFFERENTIAL (CANCER CENTER ONLY)
Abs Immature Granulocytes: 0.03 10*3/uL (ref 0.00–0.07)
Basophils Absolute: 0.1 10*3/uL (ref 0.0–0.1)
Basophils Relative: 1 %
Eosinophils Absolute: 0.1 10*3/uL (ref 0.0–0.5)
Eosinophils Relative: 2 %
HCT: 39.5 % (ref 36.0–46.0)
Hemoglobin: 13.5 g/dL (ref 12.0–15.0)
Immature Granulocytes: 0 %
Lymphocytes Relative: 36 %
Lymphs Abs: 2.5 10*3/uL (ref 0.7–4.0)
MCH: 39.1 pg — ABNORMAL HIGH (ref 26.0–34.0)
MCHC: 34.2 g/dL (ref 30.0–36.0)
MCV: 114.5 fL — ABNORMAL HIGH (ref 80.0–100.0)
Monocytes Absolute: 0.6 10*3/uL (ref 0.1–1.0)
Monocytes Relative: 9 %
Neutro Abs: 3.6 10*3/uL (ref 1.7–7.7)
Neutrophils Relative %: 52 %
Platelet Count: 485 10*3/uL — ABNORMAL HIGH (ref 150–400)
RBC: 3.45 MIL/uL — ABNORMAL LOW (ref 3.87–5.11)
RDW: 13.5 % (ref 11.5–15.5)
WBC Count: 7 10*3/uL (ref 4.0–10.5)
nRBC: 0 % (ref 0.0–0.2)

## 2022-11-26 MED ORDER — HYDROXYUREA 500 MG PO CAPS: 500.0000 mg | ORAL_CAPSULE | ORAL | 2 refills | Status: DC

## 2022-11-26 NOTE — Assessment & Plan Note (Signed)
Calcium is chronically decreased.  Stable. Recommend patient to continue calcium supplementation.

## 2022-11-26 NOTE — Assessment & Plan Note (Addendum)
Labs reviewed and discussed with patient.  Platelet count is 485,000. Recommend patient to increase hydroxyurea to 1000mg  on 2 days per week, and 500mg  daily the rest of the days.

## 2022-11-26 NOTE — Progress Notes (Signed)
Hematology/Oncology Progress note [phone] [phone]      Patient Care Team: Lorre Munroe, NP as PCP - General (Internal Medicine)  ASSESSMENT & PLAN:   Essential thrombocythemia [facility]) Labs reviewed and discussed with patient.  Platelet count is 485,000. Recommend patient to increase hydroxyurea to 1000mg  on 2 days per week, and 500mg  daily the rest of the days.   Hypocalcemia Calcium is chronically decreased.  Stable. Recommend patient to continue calcium supplementation.  Elevated alkaline phosphatase level Mildly increased.  She does not use alcohol. Will repeat level in 3 months.   Orders Placed This Encounter  Procedures   CBC with Differential (Cancer Center Only)    Standing Status:   Future    Standing Expiration Date:   11/26/2023   CMP (Cancer Center only)    Standing Status:   Future    Standing Expiration Date:   11/26/2023   Hepatitis B surface antigen    Standing Status:   Future    Standing Expiration Date:   11/26/2023   Hepatitis B core antibody, IgM    Standing Status:   Future    Standing Expiration Date:   11/26/2023    Follow up in 3 months.   All questions were answered. The patient knows to call the clinic with any problems, questions or concerns.  Rickard Patience, MD, PhD [facility] Hematology Oncology 11/26/2022    CHIEF COMPLAINTS/REASON FOR VISIT:  Essential thrombocythemia  HISTORY OF PRESENTING ILLNESS:  Tiffany Richardson is a 77 y.o. female who was seen in consultation at the request of Baity, Salvadore [place], NP for evaluation of thromcbocytosis Reviewed patient's labs.  08/07/2020 labs showed elevated platelet counts at 904,000.  Normal wbc  and hemoglobin   Reviewed patient's previous labs. Thrombocytosis onset is chronic onset , duration since at least 2017 No aggravating or elevated factors. Associated symptoms or signs:  Denies weight loss, fever, chills, fatigue, night sweats.  Context:  Smoking history:  Denies Family history of polycythemia.  Denies.  However his father had a history of needing frequent blood removal History of iron deficiency anemia: Denies History of DVT: Denies She also reports chronic abdominal bloating.  She has IBS.  Denies any unintentional weight loss, fever, nausea vomiting.  No recent colonoscopy in current EMR. She is widowed, husband had colon cancer.  # Jak 2 V61F mutation positive 08/21/2020 bone marrow biopsy results showed hypercellular bone marrow for age with features of myeloproliferative neoplasm.  Consistent with essential thrombocythemia, iron deficient polycythemia vera, pretty fibrotic/early primary myelofibrosis. Patient has no erythrocytosis.  No chronic leukocytosis.  Clinically most consistent with essential thrombocythemia.  # Chronic bloating and abdominal discomfort, possible due to chronic IBS symptoms.  Last colonoscopy 2013. Patient was seen by Dr. Mechele Collin in 2019 and was recommended to increase soluble fiber, MiraLAX, if not helpful trial of FODMAP diet and probiotics.  Discussed with patient that I recommend surveillance colonoscopy.  07/06/2021, CT scan showed constipation.  Otherwise no acute intra-a patient bdominal or intrapelvic abnormality. Reports alternate of constipation and diarrhea.  Not associated with any food or medication.  INTERVAL HISTORY Tiffany Richardson is a 77 y.o. female who has above history reviewed by me today presents for follow up visit for management of essential thrombocythemia Patient has been on  hydroxyurea to 1000mg  1 day per week, and 500mg  daily the rest of the days.   Patient tolerates well. Chronic sinus issue.    Review of Systems  Constitutional:  Negative  for appetite change, chills, fatigue and fever.  HENT:   Negative for hearing loss and voice change.   Eyes:  Negative for eye problems.  Respiratory:  Negative for chest tightness and cough.   Cardiovascular:  Negative for chest pain.   Gastrointestinal:  Negative for abdominal distention, abdominal pain and blood in stool.  Endocrine: Negative for hot flashes.  Genitourinary:  Negative for difficulty urinating and frequency.   Musculoskeletal:  Negative for arthralgias.  Skin:  Negative for itching and rash.  Neurological:  Negative for extremity weakness.  Hematological:  Negative for adenopathy.  Psychiatric/Behavioral:  Negative for confusion.     MEDICAL HISTORY:  Past Medical History:  Diagnosis Date   Allergy    Cancer (HCC)    basal cell   Emphysema of lung (HCC)    Hypertension     SURGICAL HISTORY: Past Surgical History:  Procedure Laterality Date   basal cell removed     Rt eye   squamous cell removed       SOCIAL HISTORY: Social History   Socioeconomic History   Marital status: Widowed    Spouse name: Not on file   Number of children: Not on file   Years of education: Not on file   Highest education level: Not on file  Occupational History   Not on file  Tobacco Use   Smoking status: Never   Smokeless tobacco: Never  Vaping Use   Vaping status: Never Used  Substance and Sexual Activity   Alcohol use: No    Alcohol/week: 0.0 standard drinks of alcohol   Drug use: No   Sexual activity: Not on file  Other Topics Concern   Not on file  Social History Narrative   Not on file   Social Determinants of Health   Financial Resource Strain: Not on file  Food Insecurity: Not on file  Transportation Needs: Not on file  Physical Activity: Not on file  Stress: Not on file  Social Connections: Not on file  Intimate Partner Violence: Not on file    FAMILY HISTORY: Family History  Problem Relation Age of Onset   Leukemia Mother    Heart disease Father    Non-Hodgkin's lymphoma Daughter     ALLERGIES:  is allergic to codeine.  MEDICATIONS:  Current Outpatient Medications  Medication Sig Dispense Refill   aspirin EC 81 MG tablet Take 81 mg by mouth.     atorvastatin (LIPITOR)  40 MG tablet TAKE 1 TABLET BY MOUTH EVERY DAY 90 tablet 1   fluticasone (FLONASE) 50 MCG/ACT nasal spray SPRAY 2 SPRAYS INTO EACH NOSTRIL EVERY DAY 48 mL 0   metoprolol succinate (TOPROL-XL) 50 MG 24 hr tablet TAKE 1 TABLET BY MOUTH EVERY DAY WITH OR IMMEDIATELY FOLLOWING A MEAL 90 tablet 0   sucralfate (CARAFATE) 1 g tablet Take by mouth.     hydroxyurea (HYDREA) 500 MG capsule Take 1 capsule (500 mg total) by mouth See admin instructions. Take 1000mg  on Wednesdays and Sundays, and take 500mg  daily for the rest of the week. 94 capsule 2   hyoscyamine (LEVSIN) 0.125 MG tablet TAKE 1 TABLET (0.125 MG TOTAL) BY MOUTH EVERY 4 (FOUR) HOURS AS NEEDED. (Patient not taking: Reported on 11/26/2022) 30 tablet 0   No current facility-administered medications for this visit.     PHYSICAL EXAMINATION: ECOG PERFORMANCE STATUS: 0 - Asymptomatic Vitals:   11/26/22 1442  BP: 127/86  Pulse: 73  Resp: 19  Temp: 97.9 F (36.6 C)  SpO2:  100%   Filed Weights   11/26/22 1442  Weight: 148 lb 11.2 oz (67.4 kg)    Physical Exam Constitutional:      General: She is not in acute distress. HENT:     Head: Normocephalic.  Eyes:     General: No scleral icterus. Cardiovascular:     Rate and Rhythm: Normal rate.  Pulmonary:     Effort: Pulmonary effort is normal. No respiratory distress.  Abdominal:     General: There is no distension.  Musculoskeletal:        General: No deformity. Normal range of motion.     Cervical back: Normal range of motion.  Skin:    General: Skin is warm and dry.     Findings: No erythema or rash.  Neurological:     Mental Status: She is alert and oriented to person, place, and time. Mental status is at baseline.     Cranial Nerves: No cranial nerve deficit.     Coordination: Coordination normal.  Psychiatric:        Mood and Affect: Mood normal.      LABORATORY DATA:  I have reviewed the data as listed    Latest Ref Rng & Units 11/26/2022    2:27 PM 08/21/2022     1:49 PM 05/21/2022    2:14 PM  CBC  WBC 4.0 - 10.5 K/uL 7.0  7.6  7.0   Hemoglobin 12.0 - 15.0 g/dL 91.4  78.2  95.6   Hematocrit 36.0 - 46.0 % 39.5  38.6  40.8   Platelets 150 - 400 K/uL 485  440  490       Latest Ref Rng & Units 11/26/2022    2:27 PM 08/21/2022    1:49 PM 05/21/2022    2:14 PM  CMP  Glucose 70 - 99 mg/dL 99  85  94   BUN 8 - 23 mg/dL 12  16  18    Creatinine 0.44 - 1.00 mg/dL 2.13  0.86  5.78   Sodium 135 - 145 mmol/L 138  136  138   Potassium 3.5 - 5.1 mmol/L 4.6  4.4  4.4   Chloride 98 - 111 mmol/L 105  104  101   CO2 22 - 32 mmol/L 26  27  27    Calcium 8.9 - 10.3 mg/dL 8.6  8.6  8.8   Total Protein 6.5 - 8.1 g/dL 6.8  6.5  7.2   Total Bilirubin 0.3 - 1.2 mg/dL 0.7  0.8  0.8   Alkaline Phos 38 - 126 U/L 134  125  126   AST 15 - 41 U/L 24  20  23    ALT 0 - 44 U/L 21  16  19     Lab Results  Component Value Date   IRON 85 08/15/2020   TIBC 337 08/15/2020   FERRITIN 31 08/15/2020       RADIOGRAPHIC STUDIES: I have personally reviewed the radiological images as listed and agreed with the findings in the report. No results found.

## 2022-11-26 NOTE — Assessment & Plan Note (Signed)
Mildly increased.  She does not use alcohol. Will repeat level in 3 months.

## 2022-12-12 ENCOUNTER — Encounter: Payer: Self-pay | Admitting: Gastroenterology

## 2022-12-19 ENCOUNTER — Ambulatory Visit: Payer: Medicare HMO | Admitting: Registered Nurse

## 2022-12-19 ENCOUNTER — Ambulatory Visit
Admission: RE | Admit: 2022-12-19 | Discharge: 2022-12-19 | Disposition: A | Discharge status: Home / Self Care | Payer: Medicare HMO | Attending: Gastroenterology | Admitting: Gastroenterology

## 2022-12-19 ENCOUNTER — Encounter
Admission: RE | Disposition: A | Discharge status: Home / Self Care | Payer: Self-pay | Source: Home / Self Care | Attending: Gastroenterology

## 2022-12-19 ENCOUNTER — Other Ambulatory Visit: Payer: Self-pay

## 2022-12-19 ENCOUNTER — Encounter: Payer: Self-pay | Admitting: Gastroenterology

## 2022-12-19 DIAGNOSIS — K640 First degree hemorrhoids: Secondary | ICD-10-CM | POA: Insufficient documentation

## 2022-12-19 DIAGNOSIS — K635 Polyp of colon: Secondary | ICD-10-CM | POA: Diagnosis not present

## 2022-12-19 DIAGNOSIS — D125 Benign neoplasm of sigmoid colon: Secondary | ICD-10-CM | POA: Insufficient documentation

## 2022-12-19 DIAGNOSIS — D124 Benign neoplasm of descending colon: Secondary | ICD-10-CM | POA: Insufficient documentation

## 2022-12-19 DIAGNOSIS — J439 Emphysema, unspecified: Secondary | ICD-10-CM | POA: Insufficient documentation

## 2022-12-19 DIAGNOSIS — K219 Gastro-esophageal reflux disease without esophagitis: Secondary | ICD-10-CM | POA: Insufficient documentation

## 2022-12-19 DIAGNOSIS — Z83719 Family history of colon polyps, unspecified: Secondary | ICD-10-CM | POA: Diagnosis not present

## 2022-12-19 DIAGNOSIS — K317 Polyp of stomach and duodenum: Secondary | ICD-10-CM | POA: Insufficient documentation

## 2022-12-19 DIAGNOSIS — I10 Essential (primary) hypertension: Secondary | ICD-10-CM | POA: Insufficient documentation

## 2022-12-19 DIAGNOSIS — K319 Disease of stomach and duodenum, unspecified: Secondary | ICD-10-CM | POA: Insufficient documentation

## 2022-12-19 DIAGNOSIS — Z1211 Encounter for screening for malignant neoplasm of colon: Secondary | ICD-10-CM | POA: Insufficient documentation

## 2022-12-19 DIAGNOSIS — Z86010 Personal history of colonic polyps: Secondary | ICD-10-CM | POA: Insufficient documentation

## 2022-12-19 DIAGNOSIS — K6389 Other specified diseases of intestine: Secondary | ICD-10-CM | POA: Diagnosis not present

## 2022-12-19 DIAGNOSIS — K2960 Other gastritis without bleeding: Secondary | ICD-10-CM | POA: Diagnosis not present

## 2022-12-19 HISTORY — PX: COLONOSCOPY WITH PROPOFOL: SHX5780

## 2022-12-19 HISTORY — PX: BIOPSY: SHX5522

## 2022-12-19 HISTORY — PX: ESOPHAGOGASTRODUODENOSCOPY (EGD) WITH PROPOFOL: SHX5813

## 2022-12-19 HISTORY — PX: POLYPECTOMY: SHX5525

## 2022-12-19 SURGERY — COLONOSCOPY WITH PROPOFOL
Anesthesia: General

## 2022-12-19 MED ORDER — LIDOCAINE HCL (PF) 2 % IJ SOLN
INTRAMUSCULAR | Status: AC
  Filled 2022-12-19: qty 5

## 2022-12-19 MED ORDER — LIDOCAINE HCL (CARDIAC) PF 100 MG/5ML IV SOSY
PREFILLED_SYRINGE | INTRAVENOUS | Status: DC | PRN
  Administered 2022-12-19: 100 mg via INTRAVENOUS

## 2022-12-19 MED ORDER — SODIUM CHLORIDE 0.9 % IV SOLN: INTRAVENOUS | Status: DC

## 2022-12-19 MED ORDER — PROPOFOL 500 MG/50ML IV EMUL
INTRAVENOUS | Status: DC | PRN
  Administered 2022-12-19: 100 ug/kg/min via INTRAVENOUS

## 2022-12-19 MED ORDER — PHENYLEPHRINE 80 MCG/ML (10ML) SYRINGE FOR IV PUSH (FOR BLOOD PRESSURE SUPPORT)
PREFILLED_SYRINGE | INTRAVENOUS | Status: AC
  Filled 2022-12-19: qty 10

## 2022-12-19 MED ORDER — PROPOFOL 10 MG/ML IV BOLUS
INTRAVENOUS | Status: DC | PRN
  Administered 2022-12-19: 20 mg via INTRAVENOUS
  Administered 2022-12-19: 30 mg via INTRAVENOUS
  Administered 2022-12-19: 100 mg via INTRAVENOUS
  Administered 2022-12-19: 20 mg via INTRAVENOUS

## 2022-12-19 MED ORDER — PROPOFOL 1000 MG/100ML IV EMUL
INTRAVENOUS | Status: AC
  Filled 2022-12-19: qty 100

## 2022-12-19 MED ORDER — PROPOFOL 10 MG/ML IV BOLUS
INTRAVENOUS | Status: AC
  Filled 2022-12-19: qty 20

## 2022-12-19 NOTE — Op Note (Signed)
[facility] Gastroenterology Patient Name: Manreet Sarr Procedure Date: 12/19/2022 7:21 AM MRN: [id number] Account #: [id number] Date of Birth: [birth year] Admit Type: Outpatient Age: 77 Room: [facility] ENDO ROOM 1 Gender: Female Note Status: Finalized Instrument Name: Patton Salles Endoscope [id number] Procedure:             Upper GI endoscopy Indications:           Abdominal pain in the left upper quadrant, Suspected                         gastro-esophageal reflux disease Providers:             Trenda Moots, DO Referring MD:          Lorre Munroe (Referring MD) Medicines:             Monitored Anesthesia Care Complications:         No immediate complications. Estimated blood loss:                         Minimal. Procedure:             Pre-Anesthesia Assessment:                        - Prior to the procedure, a History and Physical was                         performed, and patient medications and allergies were                         reviewed. The patient is competent. The risks and                         benefits of the procedure and the sedation options and                         risks were discussed with the patient. All questions                         were answered and informed consent was obtained.                         Patient identification and proposed procedure were                         verified by the physician, the nurse, the anesthetist                         and the technician in the endoscopy suite. Mental                         Status Examination: alert and oriented. Airway                         Examination: normal oropharyngeal airway and neck                         mobility. Respiratory Examination: clear to  auscultation. CV Examination: RRR, no murmurs, no S3                         or S4. Prophylactic Antibiotics: The patient does not                         require prophylactic antibiotics. Prior                          Anticoagulants: The patient has taken no anticoagulant                         or antiplatelet agents. ASA Grade Assessment: III - A                         patient with severe systemic disease. After reviewing                         the risks and benefits, the patient was deemed in                         satisfactory condition to undergo the procedure. The                         anesthesia plan was to use monitored anesthesia care                         (MAC). Immediately prior to administration of                         medications, the patient was re-assessed for adequacy                         to receive sedatives. The heart rate, respiratory                         rate, oxygen saturations, blood pressure, adequacy of                         pulmonary ventilation, and response to care were                         monitored throughout the procedure. The physical                         status of the patient was re-assessed after the                         procedure.                        After obtaining informed consent, the endoscope was                         passed under direct vision. Throughout the procedure,                         the patient's blood pressure, pulse, and oxygen  saturations were monitored continuously. The Endoscope                         was introduced through the mouth, and advanced to the                         second part of duodenum. The upper GI endoscopy was                         accomplished without difficulty. The patient tolerated                         the procedure well. Findings:      The duodenal bulb, first portion of the duodenum and second portion of       the duodenum were normal. Biopsies for histology were taken with a cold       forceps for evaluation of celiac disease. Estimated blood loss was       minimal.      Localized mild inflammation characterized by erythema was found in the        gastric antrum. Biopsies were taken with a cold forceps for Helicobacter       pylori testing. Estimated blood loss was minimal.      The exam of the stomach was otherwise normal.      The Z-line was regular. Estimated blood loss: none.      Esophagogastric landmarks were identified: the gastroesophageal junction       was found at 34 cm from the incisors.      The exam of the esophagus was otherwise normal. Impression:            - Normal duodenal bulb, first portion of the duodenum                         and second portion of the duodenum. Biopsied.                        - Gastritis. Biopsied.                        - Z-line regular.                        - Esophagogastric landmarks identified. Recommendation:        - Patient has a contact number available for                         emergencies. The signs and symptoms of potential                         delayed complications were discussed with the patient.                         Return to normal activities tomorrow. Written                         discharge instructions were provided to the patient.                        - Discharge patient to home.                        -  Resume previous diet.                        - Continue present medications.                        - Recommend FODMAP diet                        - Await pathology results.                        - Return to GI office as previously scheduled.                        - Proceed with colonoscopy. See report for further                         recommendations.                        - The findings and recommendations were discussed with                         the patient. Procedure Code(s):     --- Professional ---                        [phone], Esophagogastroduodenoscopy, flexible,                         transoral; with biopsy, single or multiple Diagnosis Code(s):     --- Professional ---                        K29.70, Gastritis, unspecified, without  bleeding                        R10.12, Left upper quadrant pain CPT copyright 2022 American Medical Association. All rights reserved. The codes documented in this report are preliminary and upon coder review may  be revised to meet current compliance requirements. Attending Participation:      I personally performed the entire procedure. Elfredia Nevins, DO Jaynie Collins DO, DO 12/19/2022 7:56:54 AM This report has been signed electronically. Number of Addenda: 0 Note Initiated On: 12/19/2022 7:21 AM Estimated Blood Loss:  Estimated blood loss was minimal.      [facility]

## 2022-12-19 NOTE — Anesthesia Procedure Notes (Signed)
Procedure Name: General with mask airway Date/Time: 12/19/2022 7:47 AM  Performed by: Lily Lovings, CRNAPre-anesthesia Checklist: Patient identified, Emergency Drugs available, Suction available, Timeout performed and Patient being monitored Oxygen Delivery Method: Simple face mask Preoxygenation: Pre-oxygenation with 100% oxygen Induction Type: IV induction

## 2022-12-19 NOTE — H&P (Signed)
Pre-Procedure H&P   Patient ID: Tiffany Richardson is a 77 y.o. female.  Gastroenterology Provider: Jaynie Collins, DO  Referring Provider: Tawni Pummel, PA PCP: Lorre Munroe, NP  Date: 12/19/2022  HPI Tiffany Richardson is a 77 y.o. female who presents today for Esophagogastroduodenoscopy and Colonoscopy for GERD, chronic left upper quadrant pain, personal history of colon polyps .  Patient with chronic left upper quadrant pain.  She feels as though this has been worsening as of late both pre and postprandially.  No nausea or vomiting.  She denies any dysphagia or odynophagia. Bowels have typically been loose.  She did have a series of bowel movements (2-3) in a day, but only having a bowel movement every 2 to 3 days.  She denies melena or hematochezia  Father- colon polyps  Last underwent colonoscopy in September 2019 with 1 adenomatous polyp and internal hemorrhoids. 04/10/2012 colonoscopy normal 06/2006 EGD and colonoscopy 1 adenomatous polyp and hiatal hernia  Hemoglobin 13.5 MCV 114.5 platelets 285,000 creatinine 1.05   Past Medical History:  Diagnosis Date   Allergy    Cancer (HCC)    basal cell   Emphysema of lung (HCC)    Hypertension     Past Surgical History:  Procedure Laterality Date   basal cell removed     Rt eye   COLONOSCOPY     squamous cell removed       Family History Father- colon polyps No other h/o GI disease or malignancy  Review of Systems  Constitutional:  Negative for activity change, appetite change, chills, diaphoresis, fatigue, fever and unexpected weight change.  HENT:  Negative for trouble swallowing and voice change.   Respiratory:  Negative for shortness of breath and wheezing.   Cardiovascular:  Negative for chest pain, palpitations and leg swelling.  Gastrointestinal:  Positive for abdominal pain. Negative for abdominal distention, anal bleeding, blood in stool, constipation, diarrhea, nausea, rectal pain and vomiting.   Musculoskeletal:  Negative for arthralgias and myalgias.  Skin:  Negative for color change and pallor.  Neurological:  Negative for dizziness, syncope and weakness.  Psychiatric/Behavioral:  Negative for confusion.   All other systems reviewed and are negative.    Medications No current facility-administered medications on file prior to encounter.   Current Outpatient Medications on File Prior to Encounter  Medication Sig Dispense Refill   aspirin EC 81 MG tablet Take 81 mg by mouth.     sucralfate (CARAFATE) 1 g tablet Take by mouth.     hyoscyamine (LEVSIN) 0.125 MG tablet TAKE 1 TABLET (0.125 MG TOTAL) BY MOUTH EVERY 4 (FOUR) HOURS AS NEEDED. (Patient not taking: Reported on 11/26/2022) 30 tablet 0    Pertinent medications related to GI and procedure were reviewed by me with the patient prior to the procedure   Current Facility-Administered Medications:    0.9 %  sodium chloride infusion, , Intravenous, Continuous, Jaynie Collins, DO  sodium chloride         Allergies  Allergen Reactions   Codeine Swelling   Allergies were reviewed by me prior to the procedure  Objective   Body mass index is 24.82 kg/m. Vitals:   12/19/22 0733  BP: (!) 152/74  Pulse: 88  Temp: (!) 97.4 F (36.3 C)  TempSrc: Temporal  SpO2: 98%  Weight: 65.6 kg     Physical Exam Vitals and nursing note reviewed.  Constitutional:      General: She is not in acute distress.  Appearance: Normal appearance. She is not ill-appearing, toxic-appearing or diaphoretic.  HENT:     Head: Normocephalic and atraumatic.     Nose: Nose normal.     Mouth/Throat:     Mouth: Mucous membranes are moist.     Pharynx: Oropharynx is clear.  Eyes:     General: No scleral icterus.    Extraocular Movements: Extraocular movements intact.  Cardiovascular:     Rate and Rhythm: Normal rate and regular rhythm.     Heart sounds: Normal heart sounds. No murmur heard.    No friction rub. No gallop.   Pulmonary:     Effort: Pulmonary effort is normal. No respiratory distress.     Breath sounds: Normal breath sounds. No wheezing, rhonchi or rales.  Abdominal:     General: Bowel sounds are normal. There is no distension.     Palpations: Abdomen is soft.     Tenderness: There is no abdominal tenderness. There is no guarding or rebound.  Musculoskeletal:     Cervical back: Neck supple.     Right lower leg: No edema.     Left lower leg: No edema.  Skin:    General: Skin is warm and dry.     Coloration: Skin is not jaundiced or pale.  Neurological:     General: No focal deficit present.     Mental Status: She is alert and oriented to person, place, and time. Mental status is at baseline.  Psychiatric:        Mood and Affect: Mood normal.        Behavior: Behavior normal.        Thought Content: Thought content normal.        Judgment: Judgment normal.      Assessment:  Tiffany Richardson is a 77 y.o. female  who presents today for Esophagogastroduodenoscopy and Colonoscopy for GERD, chronic left upper quadrant pain, personal history of colon polyps .  Plan:  Esophagogastroduodenoscopy and Colonoscopy with possible intervention today  Esophagogastroduodenoscopy and Colonoscopy with possible biopsy, control of bleeding, polypectomy, and interventions as necessary has been discussed with the patient/patient representative. Informed consent was obtained from the patient/patient representative after explaining the indication, nature, and risks of the procedure including but not limited to death, bleeding, perforation, missed neoplasm/lesions, cardiorespiratory compromise, and reaction to medications. Opportunity for questions was given and appropriate answers were provided. Patient/patient representative has verbalized understanding is amenable to undergoing the procedure.   Jaynie Collins, DO  [facility] Gastroenterology  Portions of the record may have been created with  voice recognition software. Occasional wrong-word or 'sound-a-like' substitutions may have occurred due to the inherent limitations of voice recognition software.  Read the chart carefully and recognize, using context, where substitutions may have occurred.

## 2022-12-19 NOTE — Transfer of Care (Signed)
Immediate Anesthesia Transfer of Care Note  Patient: Tiffany Richardson  Procedure(s) Performed: COLONOSCOPY WITH PROPOFOL ESOPHAGOGASTRODUODENOSCOPY (EGD) WITH PROPOFOL BIOPSY  Patient Location: Endoscopy Unit  Anesthesia Type:General  Level of Consciousness: drowsy and patient cooperative  Airway & Oxygen Therapy: Patient Spontanous Breathing and Patient connected to face mask oxygen  Post-op Assessment: Report given to RN and Patient moving all extremities X 4  Post vital signs: Reviewed and stable  Last Vitals:  Vitals Value Taken Time  BP 104/65 12/19/22 0823  Temp 36.1 C 12/19/22 0822  Pulse 76 12/19/22 0823  Resp 23 12/19/22 0823  SpO2 98 % 12/19/22 0823  Vitals shown include unfiled device data.  Last Pain:  Vitals:   12/19/22 0822  TempSrc: Temporal  PainSc: Asleep         Complications: No notable events documented.

## 2022-12-19 NOTE — Op Note (Signed)
[facility] Gastroenterology Patient Name: Tiffany Richardson Procedure Date: 12/19/2022 7:20 AM MRN: [id number] Account #: [id number] Date of Birth: [birth year] Admit Type: Outpatient Age: 77 Room: [facility] ENDO ROOM 1 Gender: Female Note Status: Finalized Instrument Name: Colonoscope [id number] Procedure:             Colonoscopy Indications:           High risk colon cancer surveillance: Personal history                         of colonic polyps Providers:             Trenda Moots, DO Referring MD:          Lorre Munroe (Referring MD) Medicines:             Monitored Anesthesia Care Complications:         No immediate complications. Estimated blood loss:                         Minimal. Procedure:             Pre-Anesthesia Assessment:                        - Prior to the procedure, a History and Physical was                         performed, and patient medications and allergies were                         reviewed. The patient is competent. The risks and                         benefits of the procedure and the sedation options and                         risks were discussed with the patient. All questions                         were answered and informed consent was obtained.                         Patient identification and proposed procedure were                         verified by the physician, the nurse, the anesthetist                         and the technician in the endoscopy suite. Mental                         Status Examination: alert and oriented. Airway                         Examination: normal oropharyngeal airway and neck                         mobility. Respiratory Examination: clear to  auscultation. CV Examination: RRR, no murmurs, no S3                         or S4. Prophylactic Antibiotics: The patient does not                         require prophylactic antibiotics. Prior                          Anticoagulants: The patient has taken no anticoagulant                         or antiplatelet agents. ASA Grade Assessment: III - A                         patient with severe systemic disease. After reviewing                         the risks and benefits, the patient was deemed in                         satisfactory condition to undergo the procedure. The                         anesthesia plan was to use monitored anesthesia care                         (MAC). Immediately prior to administration of                         medications, the patient was re-assessed for adequacy                         to receive sedatives. The heart rate, respiratory                         rate, oxygen saturations, blood pressure, adequacy of                         pulmonary ventilation, and response to care were                         monitored throughout the procedure. The physical                         status of the patient was re-assessed after the                         procedure.                        After obtaining informed consent, the colonoscope was                         passed under direct vision. Throughout the procedure,                         the patient's blood pressure, pulse, and oxygen  saturations were monitored continuously. The                         Colonoscope was introduced through the anus and                         advanced to the the terminal ileum, with                         identification of the appendiceal orifice and IC                         valve. The colonoscopy was performed without                         difficulty. The patient tolerated the procedure well.                         The quality of the bowel preparation was evaluated                         using the BBPS [facility] Bowel Preparation Scale) with                         scores of: Right Colon = 2 (minor amount of residual                         staining, small fragments  of stool and/or opaque                         liquid, but mucosa seen well), Transverse Colon = 3                         (entire mucosa seen well with no residual staining,                         small fragments of stool or opaque liquid) and Left                         Colon = 3 (entire mucosa seen well with no residual                         staining, small fragments of stool or opaque liquid).                         The total BBPS score equals 8. The quality of the                         bowel preparation was excellent. The terminal ileum,                         ileocecal valve, appendiceal orifice, and rectum were                         photographed. Findings:      The perianal and digital rectal examinations were normal. Pertinent       negatives include normal sphincter tone.  The terminal ileum contained one sessile, non-bleeding polyp. The polyp       was 1 to 2 mm in diameter. These polyps were removed with a jumbo cold       forceps. Resection and retrieval were complete. Estimated blood loss was       minimal.      The remainder of the exam in the terminal ileum was normal.      Retroflexion in the right colon was performed.      Non-bleeding internal hemorrhoids were found during retroflexion. The       hemorrhoids were Grade I (internal hemorrhoids that do not prolapse).       Estimated blood loss: none.      Normal mucosa was found in the entire colon. Biopsies for histology were       taken with a cold forceps from the right colon and left colon for       evaluation of microscopic colitis. Estimated blood loss was minimal.      Two sessile polyps were found in the sigmoid colon and descending colon.       The polyps were 1 to 2 mm in size. These polyps were removed with a       jumbo cold forceps. Resection and retrieval were complete. Estimated       blood loss was minimal.      A 2 to 3 mm polyp was found in the descending colon. The polyp was        sessile. The polyp was removed with a cold snare. Resection and       retrieval were complete. Estimated blood loss was minimal.      The exam was otherwise without abnormality on direct and retroflexion       views. Impression:            - One polyp in the terminal ileum, removed with a                         jumbo cold forceps. Resected and retrieved.                        - Non-bleeding internal hemorrhoids.                        - Normal mucosa in the entire examined colon. Biopsied.                        - Two 1 to 2 mm polyps in the sigmoid colon and in the                         descending colon, removed with a jumbo cold forceps.                         Resected and retrieved.                        - One 2 to 3 mm polyp in the descending colon, removed                         with a cold snare. Resected and retrieved.                        -  The examination was otherwise normal on direct and                         retroflexion views. Recommendation:        - Patient has a contact number available for                         emergencies. The signs and symptoms of potential                         delayed complications were discussed with the patient.                         Return to normal activities tomorrow. Written                         discharge instructions were provided to the patient.                        - Discharge patient to home.                        - Resume previous diet.                        - Continue present medications.                        - No ibuprofen, naproxen, or other non-steroidal                         anti-inflammatory drugs for 5 days after polyp removal.                        - Await pathology results.                        - Repeat colonoscopy for surveillance based on                         pathology results.                        - Return to GI office as previously scheduled.                        - The findings and  recommendations were discussed with                         the patient. Procedure Code(s):     --- Professional ---                        [phone], Colonoscopy, flexible; with removal of                         tumor(s), polyp(s), or other lesion(s) by snare                         technique  [id number], 59, Colonoscopy, flexible; with biopsy, single                         or multiple Diagnosis Code(s):     --- Professional ---                        Z86.010, Personal history of colonic polyps                        D13.39, Benign neoplasm of other parts of small                         intestine                        K64.0, First degree hemorrhoids                        D12.5, Benign neoplasm of sigmoid colon                        D12.4, Benign neoplasm of descending colon CPT copyright 2022 American Medical Association. All rights reserved. The codes documented in this report are preliminary and upon coder review may  be revised to meet current compliance requirements. Attending Participation:      I personally performed the entire procedure. Elfredia Nevins, DO Jaynie Collins DO, DO 12/19/2022 8:27:38 AM This report has been signed electronically. Number of Addenda: 0 Note Initiated On: 12/19/2022 7:20 AM Scope Withdrawal Time: 0 hours 13 minutes 52 seconds  Total Procedure Duration: 0 hours 20 minutes 39 seconds  Estimated Blood Loss:  Estimated blood loss was minimal.      [facility]

## 2022-12-19 NOTE — Anesthesia Preprocedure Evaluation (Signed)
Anesthesia Evaluation  Patient identified by MRN, date of birth, ID band Patient awake    Reviewed: Allergy & Precautions, NPO status , Patient's Chart, lab work & pertinent test results  Airway Mallampati: III  TM Distance: >3 FB Neck ROM: full    Dental  (+) Chipped   Pulmonary COPD   Pulmonary exam normal        Cardiovascular hypertension, negative cardio ROS Normal cardiovascular exam     Neuro/Psych negative neurological ROS  negative psych ROS   GI/Hepatic negative GI ROS, Neg liver ROS,,,  Endo/Other  negative endocrine ROS    Renal/GU negative Renal ROS  negative genitourinary   Musculoskeletal   Abdominal   Peds  Hematology negative hematology ROS (+)   Anesthesia Other Findings Past Medical History: No date: Allergy No date: Cancer [facility])     Comment:  basal cell No date: Emphysema of lung (HCC) No date: Hypertension  Past Surgical History: No date: basal cell removed     Comment:  Rt eye No date: COLONOSCOPY No date: squamous cell removed   BMI    Body Mass Index: 24.82 kg/m      Reproductive/Obstetrics negative OB ROS                             Anesthesia Physical Anesthesia Plan  ASA: 3  Anesthesia Plan: General   Post-op Pain Management: Minimal or no pain anticipated   Induction: Intravenous  PONV Risk Score and Plan: 3 and Propofol infusion, TIVA and Ondansetron  Airway Management Planned: Nasal Cannula  Additional Equipment: None  Intra-op Plan:   Post-operative Plan:   Informed Consent: I have reviewed the patients History and Physical, chart, labs and discussed the procedure including the risks, benefits and alternatives for the proposed anesthesia with the patient or authorized representative who has indicated his/her understanding and acceptance.     Dental advisory given  Plan Discussed with: CRNA and Surgeon  Anesthesia Plan  Comments: (Discussed risks of anesthesia with patient, including possibility of difficulty with spontaneous ventilation under anesthesia necessitating airway intervention, PONV, and rare risks such as cardiac or respiratory or neurological events, and allergic reactions. Discussed the role of CRNA in patient's perioperative care. Patient understands.)       Anesthesia Quick Evaluation

## 2022-12-19 NOTE — Interval H&P Note (Signed)
History and Physical Interval Note: Preprocedure H&P from 12/19/22  was reviewed and there was no interval change after seeing and examining the patient.  Written consent was obtained from the patient after discussion of risks, benefits, and alternatives. Patient has consented to proceed with Esophagogastroduodenoscopy and Colonoscopy with possible intervention   12/19/2022 7:41 AM  Tiffany Richardson  has presented today for surgery, with the diagnosis of 530.81 (ICD-9-CM) - K21.9 (ICD-10-CM) - Gastroesophageal reflux disease, unspecified whether esophagitis present 789.02 (ICD-9-CM) - R10.12 (ICD-10-CM) - LUQ pain V12.72 (ICD-9-CM) - Z86.010 (ICD-10-CM) - Personal history of colonic polyps.  The various methods of treatment have been discussed with the patient and family. After consideration of risks, benefits and other options for treatment, the patient has consented to  Procedure(s): COLONOSCOPY WITH PROPOFOL (N/A) ESOPHAGOGASTRODUODENOSCOPY (EGD) WITH PROPOFOL (N/A) as a surgical intervention.  The patient's history has been reviewed, patient examined, no change in status, stable for surgery.  I have reviewed the patient's chart and labs.  Questions were answered to the patient's satisfaction.     Jaynie Collins

## 2022-12-19 NOTE — Anesthesia Postprocedure Evaluation (Signed)
Anesthesia Post Note  Patient: Tiffany Richardson  Procedure(s) Performed: COLONOSCOPY WITH PROPOFOL ESOPHAGOGASTRODUODENOSCOPY (EGD) WITH PROPOFOL BIOPSY  Patient location during evaluation: Endoscopy Anesthesia Type: General Level of consciousness: awake and alert Pain management: pain level controlled Vital Signs Assessment: post-procedure vital signs reviewed and stable Respiratory status: spontaneous breathing, nonlabored ventilation, respiratory function stable and patient connected to nasal cannula oxygen Cardiovascular status: blood pressure returned to baseline and stable Postop Assessment: no apparent nausea or vomiting Anesthetic complications: no  No notable events documented.   Last Vitals:  Vitals:   12/19/22 0733 12/19/22 0822  BP: (!) 152/74 104/65  Pulse: 88 73  Resp:  20  Temp: (!) 36.3 C (!) 36.1 C  SpO2: 98% 99%    Last Pain:  Vitals:   12/19/22 0822  TempSrc: Temporal  PainSc: Asleep                 Stephanie Coup

## 2022-12-20 ENCOUNTER — Encounter: Payer: Self-pay | Admitting: Gastroenterology

## 2022-12-25 DIAGNOSIS — D485 Neoplasm of uncertain behavior of skin: Secondary | ICD-10-CM | POA: Diagnosis not present

## 2022-12-25 DIAGNOSIS — Z7189 Other specified counseling: Secondary | ICD-10-CM | POA: Diagnosis not present

## 2022-12-25 DIAGNOSIS — Z08 Encounter for follow-up examination after completed treatment for malignant neoplasm: Secondary | ICD-10-CM | POA: Diagnosis not present

## 2022-12-25 DIAGNOSIS — L570 Actinic keratosis: Secondary | ICD-10-CM | POA: Diagnosis not present

## 2022-12-25 DIAGNOSIS — C4442 Squamous cell carcinoma of skin of scalp and neck: Secondary | ICD-10-CM | POA: Diagnosis not present

## 2022-12-25 DIAGNOSIS — X32XXXA Exposure to sunlight, initial encounter: Secondary | ICD-10-CM | POA: Diagnosis not present

## 2022-12-25 DIAGNOSIS — Z85828 Personal history of other malignant neoplasm of skin: Secondary | ICD-10-CM | POA: Diagnosis not present

## 2022-12-25 DIAGNOSIS — Z872 Personal history of diseases of the skin and subcutaneous tissue: Secondary | ICD-10-CM | POA: Diagnosis not present

## 2023-01-09 DIAGNOSIS — C4442 Squamous cell carcinoma of skin of scalp and neck: Secondary | ICD-10-CM | POA: Diagnosis not present

## 2023-01-09 DIAGNOSIS — L578 Other skin changes due to chronic exposure to nonionizing radiation: Secondary | ICD-10-CM | POA: Diagnosis not present

## 2023-02-03 ENCOUNTER — Ambulatory Visit: Payer: Self-pay

## 2023-02-03 NOTE — Telephone Encounter (Signed)
     Chief Complaint: Mild abdominal pain that comes and goes.Has fatigue. 2/10 Symptoms: Above Frequency: On going Pertinent Negatives: Patient denies  Disposition: [] ED /[] Urgent Care (no appt availability in office) / [x] Appointment(In office/virtual)/ []  [facility]/ [] Home Care/ [] Refused Recommended Disposition /[] [facility]/ []  Follow-up with PCP Additional Notes: Pt. Agrees with appointment.  Reason for Disposition  [1] MILD pain (e.g., does not interfere with normal activities) AND [2] pain comes and goes (cramps) AND [3] present > 48 hours  (Exception: This same abdominal pain is a chronic symptom recurrent or ongoing AND present > 4 weeks.)  Answer Assessment - Initial Assessment Questions 1. LOCATION: "Where does it hurt?"      Upper  2. RADIATION: "Does the pain shoot anywhere else?" (e.g., chest, back)     No 3. ONSET: "When did the pain begin?" (e.g., minutes, hours or days ago)      On going 4. SUDDEN: "Gradual or sudden onset?"     Gradual 5. PATTERN "Does the pain come and go, or is it constant?"    - If it comes and goes: "How long does it last?" "Do you have pain now?"     (Note: Comes and goes means the pain is intermittent. It goes away completely between bouts.)    - If constant: "Is it getting better, staying the same, or getting worse?"      (Note: Constant means the pain never goes away completely; most serious pain is constant and gets worse.)      Comes and goes 6. SEVERITY: "How bad is the pain?"  (e.g., Scale 1-10; mild, moderate, or severe)    - MILD (1-3): Doesn't interfere with normal activities, abdomen soft and not tender to touch.     - MODERATE (4-7): Interferes with normal activities or awakens from sleep, abdomen tender to touch.     - SEVERE (8-10): Excruciating pain, doubled over, unable to do any normal activities.       2 7. RECURRENT SYMPTOM: "Have you ever had this type of stomach pain before?" If Yes, ask: "When  was the last time?" and "What happened that time?"      Yes 8. CAUSE: "What do you think is causing the stomach pain?"     IBS 9. RELIEVING/AGGRAVATING FACTORS: "What makes it better or worse?" (e.g., antacids, bending or twisting motion, bowel movement)     Eating dairy 10. OTHER SYMPTOMS: "Do you have any other symptoms?" (e.g., back pain, diarrhea, fever, urination pain, vomiting)       Fatigue 11. PREGNANCY: "Is there any chance you are pregnant?" "When was your last menstrual period?"       No  Protocols used: Abdominal Pain - [facility]

## 2023-02-05 ENCOUNTER — Encounter: Payer: Self-pay | Admitting: Internal Medicine

## 2023-02-05 ENCOUNTER — Ambulatory Visit (INDEPENDENT_AMBULATORY_CARE_PROVIDER_SITE_OTHER): Payer: Medicare HMO | Admitting: Internal Medicine

## 2023-02-05 VITALS — BP 138/82 | HR 96 | Ht 64.0 in | Wt 145.0 lb

## 2023-02-05 DIAGNOSIS — K582 Mixed irritable bowel syndrome: Secondary | ICD-10-CM

## 2023-02-05 DIAGNOSIS — I700 Atherosclerosis of aorta: Secondary | ICD-10-CM | POA: Diagnosis not present

## 2023-02-05 DIAGNOSIS — D473 Essential (hemorrhagic) thrombocythemia: Secondary | ICD-10-CM

## 2023-02-05 DIAGNOSIS — I10 Essential (primary) hypertension: Secondary | ICD-10-CM

## 2023-02-05 DIAGNOSIS — E782 Mixed hyperlipidemia: Secondary | ICD-10-CM | POA: Diagnosis not present

## 2023-02-05 DIAGNOSIS — K219 Gastro-esophageal reflux disease without esophagitis: Secondary | ICD-10-CM | POA: Diagnosis not present

## 2023-02-05 MED ORDER — LOSARTAN POTASSIUM 25 MG PO TABS: 25.0000 mg | ORAL_TABLET | Freq: Every day | ORAL | 1 refills | Status: DC

## 2023-02-05 MED ORDER — HYOSCYAMINE SULFATE 0.125 MG PO TABS: 0.1250 mg | ORAL_TABLET | Freq: Four times a day (QID) | ORAL | 1 refills | Status: DC | PRN

## 2023-02-05 NOTE — Assessment & Plan Note (Signed)
Elevated on metoprolol We will add losartan 25 mg daily Reinforced DASH diet C-Met today

## 2023-02-05 NOTE — Progress Notes (Signed)
Subjective:    Patient ID: Tiffany Richardson, female    DOB: [birth year], 77 y.o.   MRN: [id number]  HPI  Patient presents to the today for follow-up of chronic conditions.  HTN: Her BP today is 142/84.  She is taking metoprolol as prescribed.  ECG from 04/2015 reviewed.  GERD: Triggered by stress.  She denies breakthrough on carafate and omeprazole.  Upper GI from 11/2022 reviewed.  IBS: She reports alternating constipation and diarrhea.  She has been having worsening abdominal pain since she has been out of the levsin.  She has failed escitalopram, dicyclomine and amitriptyline in the past.  Colonoscopy from 11/2022 reviewed.  Thrombocytopenia: Her last platelet count was 45, 11/2022.  She is taking hydroxyurea as prescribed.  She follows with hematology.  HLD with aortic atherosclerosis: Her last LDL was 65, triglycerides 284, 08/2022.  She denies myalgias on atorvastatin.  She is taking aspirin as well.  She does not consume a low-fat diet.  Review of Systems     Past Medical History:  Diagnosis Date   Allergy    Cancer (HCC)    basal cell   Emphysema of lung (HCC)    Hypertension     Current Outpatient Medications  Medication Sig Dispense Refill   aspirin EC 81 MG tablet Take 81 mg by mouth.     atorvastatin (LIPITOR) 40 MG tablet TAKE 1 TABLET BY MOUTH EVERY DAY 90 tablet 1   fluticasone (FLONASE) 50 MCG/ACT nasal spray SPRAY 2 SPRAYS INTO EACH NOSTRIL EVERY DAY 48 mL 0   hydroxyurea (HYDREA) 500 MG capsule Take 1 capsule (500 mg total) by mouth See admin instructions. Take 1000mg  on Wednesdays and Sundays, and take 500mg  daily for the rest of the week. 94 capsule 2   hyoscyamine (LEVSIN) 0.125 MG tablet TAKE 1 TABLET (0.125 MG TOTAL) BY MOUTH EVERY 4 (FOUR) HOURS AS NEEDED. (Patient not taking: Reported on 11/26/2022) 30 tablet 0   metoprolol succinate (TOPROL-XL) 50 MG 24 hr tablet TAKE 1 TABLET BY MOUTH EVERY DAY WITH OR IMMEDIATELY FOLLOWING A MEAL 90 tablet 0   sucralfate  (CARAFATE) 1 g tablet Take by mouth.     No current facility-administered medications for this visit.    Allergies  Allergen Reactions   Codeine Swelling    Family History  Problem Relation Age of Onset   Leukemia Mother    Heart disease Father    Non-Hodgkin's lymphoma Daughter     Social History   Socioeconomic History   Marital status: Widowed    Spouse name: Not on file   Number of children: Not on file   Years of education: Not on file   Highest education level: Not on file  Occupational History   Not on file  Tobacco Use   Smoking status: Never   Smokeless tobacco: Never  Vaping Use   Vaping status: Never Used  Substance and Sexual Activity   Alcohol use: No    Alcohol/week: 0.0 standard drinks of alcohol   Drug use: No   Sexual activity: Not on file  Other Topics Concern   Not on file  Social History Narrative   Not on file   Social Determinants of Health   Financial Resource Strain: Not on file  Food Insecurity: Not on file  Transportation Needs: Not on file  Physical Activity: Not on file  Stress: Not on file  Social Connections: Not on file  Intimate Partner Violence: Not on file  Constitutional: Patient reports fatigue.  Denies fever, malaise, headache or abrupt weight changes.  HEENT: Denies eye pain, eye redness, ear pain, ringing in the ears, wax buildup, runny nose, nasal congestion, bloody nose, or sore throat. Respiratory: Denies difficulty breathing, shortness of breath, cough or sputum production.   Cardiovascular: Denies chest pain, chest tightness, palpitations or swelling in the hands or feet.  Gastrointestinal: Patient reports abdominal pain, constipation and diarrhea.  Denies bloating, or blood in the stool.  GU: Denies urgency, frequency, pain with urination, burning sensation, blood in urine, odor or discharge. Musculoskeletal: Denies decrease in range of motion, difficulty with gait, muscle pain or joint pain and swelling.   Skin: Denies redness, rashes, lesions or ulcercations.  Neurological: Denies dizziness, difficulty with memory, difficulty with speech or problems with balance and coordination.  Psych: Denies anxiety, depression, SI/HI.  No other specific complaints in a complete review of systems (except as listed in HPI above).  Objective:   Physical Exam   BP 138/82   Pulse 96   Ht 5\' 4"  (1.626 m)   Wt 145 lb (65.8 kg)   SpO2 98%   BMI 24.89 kg/m   Wt Readings from Last 3 Encounters:  12/19/22 144 lb 9.6 oz (65.6 kg)  11/26/22 148 lb 11.2 oz (67.4 kg)  08/21/22 150 lb 6.4 oz (68.2 kg)    General: Appears her stated age, well developed, well nourished in NAD. Skin: Warm, dry and intact.  Cardiovascular: Normal rate and rhythm. S1,S2 noted.  No murmur, rubs or gallops noted. No JVD or BLE edema. No carotid bruits noted. Pulmonary/Chest: Normal effort and positive vesicular breath sounds. No respiratory distress. No wheezes, rales or ronchi noted.  Abdomen: Soft and nontender. Normal bowel sounds.  Musculoskeletal: No difficulty with gait.  Neurological: Alert and oriented. Coordination normal.  Psychiatric: Mood and affect normal. Behavior is normal. Judgment and thought content normal.   BMET    Component Value Date/Time   NA 138 11/26/2022 1427   NA 142 10/11/2016 0932   K 4.6 11/26/2022 1427   CL 105 11/26/2022 1427   CO2 26 11/26/2022 1427   GLUCOSE 99 11/26/2022 1427   BUN 12 11/26/2022 1427   BUN 12 10/11/2016 0932   CREATININE 1.05 (H) 11/26/2022 1427   CREATININE 0.90 04/02/2021 0840   CALCIUM 8.6 (L) 11/26/2022 1427   GFRNONAA 55 (L) 11/26/2022 1427   GFRNONAA 63 08/07/2020 1001   GFRAA 74 08/07/2020 1001    Lipid Panel     Component Value Date/Time   CHOL 124 09/02/2022 0810   CHOL 220 (H) 10/11/2016 0932   TRIG 168 (H) 09/02/2022 0810   HDL 34 (L) 09/02/2022 0810   HDL 42 10/11/2016 0932   CHOLHDL 3.6 09/02/2022 0810   LDLCALC 65 09/02/2022 0810    CBC     Component Value Date/Time   WBC 7.0 11/26/2022 1427   WBC 7.6 08/21/2022 1349   RBC 3.45 (L) 11/26/2022 1427   HGB 13.5 11/26/2022 1427   HGB 13.0 05/29/2015 1404   HCT 39.5 11/26/2022 1427   HCT 39.0 05/29/2015 1404   PLT 485 (H) 11/26/2022 1427   PLT 684 (H) 05/29/2015 1404   MCV 114.5 (H) 11/26/2022 1427   MCV 90 05/29/2015 1404   MCH 39.1 (H) 11/26/2022 1427   MCHC 34.2 11/26/2022 1427   RDW 13.5 11/26/2022 1427   RDW 13.6 05/29/2015 1404   LYMPHSABS 2.5 11/26/2022 1427   LYMPHSABS 3.5 (H) 05/29/2015 1404  MONOABS 0.6 11/26/2022 1427   EOSABS 0.1 11/26/2022 1427   EOSABS 0.3 05/29/2015 1404   BASOSABS 0.1 11/26/2022 1427   BASOSABS 0.1 05/29/2015 1404    Hgb A1C No results found for: "HGBA1C"         Assessment & Plan:     RTC in 2 weeks, follow-up HTN 4 months for your annual exam Nicki Reaper, NP

## 2023-02-05 NOTE — Assessment & Plan Note (Signed)
Will restart Levsin Encouraged low FODMAP diet

## 2023-02-05 NOTE — Assessment & Plan Note (Signed)
Lipid profile reviewed Continue lovastatin Encouraged her to consume a low-fat diet

## 2023-02-05 NOTE — Assessment & Plan Note (Signed)
Continue hydroxyurea per hematology CBC reviewed

## 2023-02-05 NOTE — Patient Instructions (Signed)

## 2023-02-05 NOTE — Assessment & Plan Note (Signed)
Encourage stress reduction techniques Continue Carafate and omeprazole

## 2023-02-05 NOTE — Assessment & Plan Note (Signed)
Lipid profile reviewed Encouraged her to consume a low-fat diet Continue lovastatin and aspirin

## 2023-02-06 DIAGNOSIS — S0100XA Unspecified open wound of scalp, initial encounter: Secondary | ICD-10-CM | POA: Diagnosis not present

## 2023-02-14 ENCOUNTER — Other Ambulatory Visit: Payer: Self-pay | Admitting: Internal Medicine

## 2023-02-14 MED ORDER — DICYCLOMINE HCL 10 MG PO CAPS: 10.0000 mg | ORAL_CAPSULE | Freq: Three times a day (TID) | ORAL | 1 refills | Status: DC

## 2023-02-14 NOTE — Progress Notes (Signed)
Patients insurance [employer] denied hyoscyamine coverage.  Alternatives include glycopyrrolate or dicyclomine.  Please advise how to proceed.

## 2023-02-14 NOTE — Progress Notes (Signed)
Patient aware.

## 2023-02-14 NOTE — Progress Notes (Signed)
Dicyclomine sent to pharmacy although when I ordered this it said not covered due to age limitation

## 2023-02-19 ENCOUNTER — Ambulatory Visit (INDEPENDENT_AMBULATORY_CARE_PROVIDER_SITE_OTHER): Payer: Medicare HMO | Admitting: Internal Medicine

## 2023-02-19 ENCOUNTER — Encounter: Payer: Self-pay | Admitting: Internal Medicine

## 2023-02-19 VITALS — BP 136/80 | Ht 64.0 in | Wt 146.0 lb

## 2023-02-19 DIAGNOSIS — R0982 Postnasal drip: Secondary | ICD-10-CM

## 2023-02-19 DIAGNOSIS — R051 Acute cough: Secondary | ICD-10-CM

## 2023-02-19 DIAGNOSIS — I10 Essential (primary) hypertension: Secondary | ICD-10-CM | POA: Diagnosis not present

## 2023-02-19 NOTE — Patient Instructions (Signed)

## 2023-02-19 NOTE — Assessment & Plan Note (Signed)
Improved on losartan in addition to metoprolol, will continue Reinforced DASH diet

## 2023-02-19 NOTE — Progress Notes (Signed)
Subjective:    Patient ID: Tiffany Richardson, female    DOB: 1945-09-18, 77 y.o.   MRN: [id number]  HPI  Patient presents to clinic today for 2-week follow-up of HTN.  At her last visit, her BP was elevated on metoprolol.  She was advised to continue this and start losartan.  She has been taking medication as prescribed.  Her BP today is 136/80.  ECG from 04/2015 reviewed.  Review of Systems     Past Medical History:  Diagnosis Date   Allergy    Cancer (HCC)    basal cell   Emphysema of lung (HCC)    Hypertension     Current Outpatient Medications  Medication Sig Dispense Refill   aspirin EC 81 MG tablet Take 81 mg by mouth.     atorvastatin (LIPITOR) 40 MG tablet TAKE 1 TABLET BY MOUTH EVERY DAY 90 tablet 1   dicyclomine (BENTYL) 10 MG capsule Take 1 capsule (10 mg total) by mouth 4 (four) times daily -  before meals and at bedtime. 270 capsule 1   fluticasone (FLONASE) 50 MCG/ACT nasal spray SPRAY 2 SPRAYS INTO EACH NOSTRIL EVERY DAY 48 mL 0   hydroxyurea (HYDREA) 500 MG capsule Take 1 capsule (500 mg total) by mouth See admin instructions. Take 1000mg  on Wednesdays and Sundays, and take 500mg  daily for the rest of the week. 94 capsule 2   hyoscyamine (LEVSIN) 0.125 MG tablet Take 1 tablet (0.125 mg total) by mouth every 6 (six) hours as needed. 90 tablet 1   losartan (COZAAR) 25 MG tablet Take 1 tablet (25 mg total) by mouth daily. 90 tablet 1   metoprolol succinate (TOPROL-XL) 50 MG 24 hr tablet TAKE 1 TABLET BY MOUTH EVERY DAY WITH OR IMMEDIATELY FOLLOWING A MEAL 90 tablet 0   sucralfate (CARAFATE) 1 g tablet Take by mouth.     No current facility-administered medications for this visit.    Allergies  Allergen Reactions   Codeine Swelling    Family History  Problem Relation Age of Onset   Leukemia Mother    Heart disease Father    Non-Hodgkin's lymphoma Daughter     Social History   Socioeconomic History   Marital status: Widowed    Spouse name: Not on file    Number of children: Not on file   Years of education: Not on file   Highest education level: GED or equivalent  Occupational History   Not on file  Tobacco Use   Smoking status: Never   Smokeless tobacco: Never  Vaping Use   Vaping status: Never Used  Substance and Sexual Activity   Alcohol use: No    Alcohol/week: 0.0 standard drinks of alcohol   Drug use: No   Sexual activity: Not on file  Other Topics Concern   Not on file  Social History Narrative   Not on file   Social Determinants of Health   Financial Resource Strain: Not on file  Food Insecurity: No Food Insecurity (02/15/2023)   Hunger Vital Sign    Worried About Running Out of Food in the Last Year: Never true    Ran Out of Food in the Last Year: Never true  Transportation Needs: No Transportation Needs (02/15/2023)   PRAPARE - Administrator, Civil Service (Medical): No    Lack of Transportation (Non-Medical): No  Physical Activity: Insufficiently Active (02/15/2023)   Exercise Vital Sign    Days of Exercise per Week: 3 days  Minutes of Exercise per Session: 30 min  Stress: No Stress Concern Present (02/15/2023)   [employer] of Occupational Health - Occupational Stress Questionnaire    Feeling of Stress : Only a little  Social Connections: Moderately Integrated (02/15/2023)   Social Connection and Isolation Panel [NHANES]    Frequency of Communication with Friends and Family: More than three times a week    Frequency of Social Gatherings with Friends and Family: More than three times a week    Attends Religious Services: More than 4 times per year    Active Member of [employer] or Organizations: Yes    Attends Banker Meetings: 1 to 4 times per year    Marital Status: Widowed  Intimate Partner Violence: Not on file     Constitutional: Denies fever, malaise, fatigue, headache or abrupt weight changes.  HEENT: Patient reports postnasal drip.  Denies eye pain, eye redness, ear  pain, ringing in the ears, wax buildup, runny nose, nasal congestion, bloody nose, or sore throat. Respiratory: Patient reports cough.  Denies difficulty breathing, shortness of breath, cough or sputum production.   Cardiovascular: Denies chest pain, chest tightness, palpitations or swelling in the hands or feet.  Neurological: Denies dizziness, difficulty with memory, difficulty with speech or problems with balance and coordination.    No other specific complaints in a complete review of systems (except as listed in HPI above).  Objective:   Physical Exam  BP 136/80   Ht 5\' 4"  (1.626 m)   Wt 146 lb (66.2 kg)   BMI 25.06 kg/m   Wt Readings from Last 3 Encounters:  02/05/23 145 lb (65.8 kg)  12/19/22 144 lb 9.6 oz (65.6 kg)  11/26/22 148 lb 11.2 oz (67.4 kg)    General: Appears her stated age, overweight, in NAD. Skin: Warm, dry and intact.  HEENT: Head: normal shape and size; Eyes: sclera white, no icterus, conjunctiva pink, PERRLA and EOMs intact;  Cardiovascular: Normal rate and rhythm. S1,S2 noted.  No murmur, rubs or gallops noted.  Pulmonary/Chest: Normal effort and positive vesicular breath sounds. No respiratory distress. No wheezes, rales or ronchi noted.  Neurological: Alert and oriented.   BMET    Component Value Date/Time   NA 138 11/26/2022 1427   NA 142 10/11/2016 0932   K 4.6 11/26/2022 1427   CL 105 11/26/2022 1427   CO2 26 11/26/2022 1427   GLUCOSE 99 11/26/2022 1427   BUN 12 11/26/2022 1427   BUN 12 10/11/2016 0932   CREATININE 1.05 (H) 11/26/2022 1427   CREATININE 0.90 04/02/2021 0840   CALCIUM 8.6 (L) 11/26/2022 1427   GFRNONAA 55 (L) 11/26/2022 1427   GFRNONAA 63 08/07/2020 1001   GFRAA 74 08/07/2020 1001    Lipid Panel     Component Value Date/Time   CHOL 124 09/02/2022 0810   CHOL 220 (H) 10/11/2016 0932   TRIG 168 (H) 09/02/2022 0810   HDL 34 (L) 09/02/2022 0810   HDL 42 10/11/2016 0932   CHOLHDL 3.6 09/02/2022 0810   LDLCALC 65  09/02/2022 0810    CBC    Component Value Date/Time   WBC 7.0 11/26/2022 1427   WBC 7.6 08/21/2022 1349   RBC 3.45 (L) 11/26/2022 1427   HGB 13.5 11/26/2022 1427   HGB 13.0 05/29/2015 1404   HCT 39.5 11/26/2022 1427   HCT 39.0 05/29/2015 1404   PLT 485 (H) 11/26/2022 1427   PLT 684 (H) 05/29/2015 1404   MCV 114.5 (H) 11/26/2022 1427  MCV 90 05/29/2015 1404   MCH 39.1 (H) 11/26/2022 1427   MCHC 34.2 11/26/2022 1427   RDW 13.5 11/26/2022 1427   RDW 13.6 05/29/2015 1404   LYMPHSABS 2.5 11/26/2022 1427   LYMPHSABS 3.5 (H) 05/29/2015 1404   MONOABS 0.6 11/26/2022 1427   EOSABS 0.1 11/26/2022 1427   EOSABS 0.3 05/29/2015 1404   BASOSABS 0.1 11/26/2022 1427   BASOSABS 0.1 05/29/2015 1404    Hgb A1C No results found for: "HGBA1C"         Assessment & Plan:   Cough secondary to PND:  Take Zyrtec 10 mg twice daily x 3 days then daily x 2 weeks If no improvement, could consider losartan as source of cough  RTC in 6 months for follow-up of chronic conditions Nicki Reaper, NP

## 2023-02-25 ENCOUNTER — Other Ambulatory Visit: Payer: Self-pay | Admitting: Internal Medicine

## 2023-02-25 DIAGNOSIS — I10 Essential (primary) hypertension: Secondary | ICD-10-CM

## 2023-02-25 DIAGNOSIS — J3089 Other allergic rhinitis: Secondary | ICD-10-CM

## 2023-02-26 ENCOUNTER — Inpatient Hospital Stay: Payer: Medicare HMO | Attending: Oncology

## 2023-02-26 ENCOUNTER — Encounter: Payer: Self-pay | Admitting: Oncology

## 2023-02-26 ENCOUNTER — Other Ambulatory Visit: Payer: Self-pay

## 2023-02-26 ENCOUNTER — Inpatient Hospital Stay (HOSPITAL_BASED_OUTPATIENT_CLINIC_OR_DEPARTMENT_OTHER): Payer: Medicare HMO | Admitting: Oncology

## 2023-02-26 VITALS — BP 126/78 | HR 85 | Temp 98.1°F | Resp 18 | Wt 148.2 lb

## 2023-02-26 DIAGNOSIS — D473 Essential (hemorrhagic) thrombocythemia: Secondary | ICD-10-CM | POA: Diagnosis not present

## 2023-02-26 DIAGNOSIS — R748 Abnormal levels of other serum enzymes: Secondary | ICD-10-CM | POA: Insufficient documentation

## 2023-02-26 DIAGNOSIS — Z7964 Long term (current) use of myelosuppressive agent: Secondary | ICD-10-CM | POA: Insufficient documentation

## 2023-02-26 LAB — CBC WITH DIFFERENTIAL (CANCER CENTER ONLY)
Abs Immature Granulocytes: 0.03 10*3/uL (ref 0.00–0.07)
Basophils Absolute: 0.1 10*3/uL (ref 0.0–0.1)
Basophils Relative: 1 %
Eosinophils Absolute: 0.3 10*3/uL (ref 0.0–0.5)
Eosinophils Relative: 4 %
HCT: 38.3 % (ref 36.0–46.0)
Hemoglobin: 13.1 g/dL (ref 12.0–15.0)
Immature Granulocytes: 1 %
Lymphocytes Relative: 26 %
Lymphs Abs: 1.6 10*3/uL (ref 0.7–4.0)
MCH: 38.8 pg — ABNORMAL HIGH (ref 26.0–34.0)
MCHC: 34.2 g/dL (ref 30.0–36.0)
MCV: 113.3 fL — ABNORMAL HIGH (ref 80.0–100.0)
Monocytes Absolute: 0.4 10*3/uL (ref 0.1–1.0)
Monocytes Relative: 6 %
Neutro Abs: 4 10*3/uL (ref 1.7–7.7)
Neutrophils Relative %: 62 %
Platelet Count: 482 10*3/uL — ABNORMAL HIGH (ref 150–400)
RBC: 3.38 MIL/uL — ABNORMAL LOW (ref 3.87–5.11)
RDW: 12.8 % (ref 11.5–15.5)
WBC Count: 6.3 10*3/uL (ref 4.0–10.5)
nRBC: 0 % (ref 0.0–0.2)

## 2023-02-26 LAB — CMP (CANCER CENTER ONLY)
ALT: 15 U/L (ref 0–44)
AST: 20 U/L (ref 15–41)
Albumin: 3.5 g/dL (ref 3.5–5.0)
Alkaline Phosphatase: 134 U/L — ABNORMAL HIGH (ref 38–126)
Anion gap: 8 (ref 5–15)
BUN: 12 mg/dL (ref 8–23)
CO2: 24 mmol/L (ref 22–32)
Calcium: 8.6 mg/dL — ABNORMAL LOW (ref 8.9–10.3)
Chloride: 103 mmol/L (ref 98–111)
Creatinine: 0.91 mg/dL (ref 0.44–1.00)
GFR, Estimated: >60 mL/min (ref >60)
Glucose, Bld: 109 mg/dL — ABNORMAL HIGH (ref 70–99)
Potassium: 3.5 mmol/L (ref 3.5–5.1)
Sodium: 135 mmol/L (ref 135–145)
Total Bilirubin: 0.9 mg/dL (ref <1.2)
Total Protein: 7 g/dL (ref 6.5–8.1)

## 2023-02-26 LAB — GAMMA GT: GGT: 41 U/L (ref 7–50)

## 2023-02-26 LAB — HEPATITIS B CORE ANTIBODY, IGM: Hep B C IgM: NONREACTIVE

## 2023-02-26 LAB — HEPATITIS B SURFACE ANTIGEN: Hepatitis B Surface Ag: NONREACTIVE

## 2023-02-26 NOTE — Assessment & Plan Note (Addendum)
Labs reviewed and discussed with patient Stable platelet counts.  Continue hydroxyurea to 1000mg  on 2 days per week, and 500mg  daily the rest of the days.

## 2023-02-26 NOTE — Progress Notes (Signed)
Pt here for follow up. No new concerns voiced.   

## 2023-02-26 NOTE — Telephone Encounter (Signed)
Requested Prescriptions  Pending Prescriptions Disp Refills   fluticasone (FLONASE) 50 MCG/ACT nasal spray [Pharmacy Med Name: FLUTICASONE PROP 50 MCG SPRAY] 48 mL 0    Sig: SPRAY 2 SPRAYS INTO EACH NOSTRIL EVERY DAY     Ear, Nose, and Throat: Nasal Preparations - Corticosteroids Passed - 02/25/2023  8:30 AM      Passed - Valid encounter within last 12 months    Recent Outpatient Visits           1 week ago Primary hypertension   [facility] [place], Salvadore Oxford, NP   3 weeks ago Aortic atherosclerosis [facility])   [place] [facility] [place], Salvadore Oxford, NP   9 months ago Mixed hyperlipidemia   [place] [facility] [place], Salvadore Oxford, NP   1 year ago Acute bacterial sinusitis   [place] [place], [place] W, NP   1 year ago Irritable bowel syndrome with both constipation and diarrhea   [facility] [facility] [place], Salvadore Oxford, NP       Future Appointments             In 5 months Baity, Salvadore Oxford, NP [place] [facility]             metoprolol succinate (TOPROL-XL) 50 MG 24 hr tablet [Pharmacy Med Name: METOPROLOL SUCC ER 50 MG TAB] 90 tablet 0    Sig: TAKE 1 TABLET BY MOUTH EVERY DAY WITH OR IMMEDIATELY FOLLOWING A MEAL     Cardiovascular:  Beta Blockers Passed - 02/25/2023  8:30 AM      Passed - Last BP in normal range    BP Readings from Last 1 Encounters:  02/19/23 136/80         Passed - Last Heart Rate in normal range    Pulse Readings from Last 1 Encounters:  02/05/23 96         Passed - Valid encounter within last 6 months    Recent Outpatient Visits           1 week ago Primary hypertension   [place] [facility] [place], Salvadore Oxford, NP   3 weeks ago Aortic atherosclerosis [facility])   [place] [facility] [place], Salvadore Oxford, NP   9 months ago Mixed hyperlipidemia   [place] [facility] [place],  Salvadore Oxford, NP   1 year ago Acute bacterial sinusitis   [place] [facility] [place], [place] W, NP   1 year ago Irritable bowel syndrome with both constipation and diarrhea   [place] [facility] [place], Salvadore Oxford, NP       Future Appointments             In 5 months Baity, Salvadore Oxford, NP [place] [facility], [facility] [place] [facility]

## 2023-02-26 NOTE — Assessment & Plan Note (Addendum)
Mildly increased.  She does not use alcohol. Check GGT

## 2023-02-26 NOTE — Progress Notes (Signed)
Hematology/Oncology Progress note [phone] [phone]      Patient Care Team: Lorre Munroe, NP as PCP - General (Internal Medicine)  ASSESSMENT & PLAN:   Essential thrombocythemia [facility]) Labs reviewed and discussed with patient Stable platelet counts.  Continue hydroxyurea to 1000mg  on 2 days per week, and 500mg  daily the rest of the days.   Hypocalcemia Calcium is chronically decreased.  Stable. Recommend patient to continue calcium supplementation.  Elevated alkaline phosphatase level Mildly increased.  She does not use alcohol. Check GGT   Orders Placed This Encounter  Procedures   CBC with Differential (Cancer Center Only)    Standing Status:   Future    Standing Expiration Date:   02/26/2024   CMP (Cancer Center only)    Standing Status:   Future    Standing Expiration Date:   02/26/2024   Gamma GT    Standing Status:   Future    Number of Occurrences:   1    Standing Expiration Date:   02/26/2024    Follow up in 3 months.   All questions were answered. The patient knows to call the clinic with any problems, questions or concerns.  Rickard Patience, MD, PhD [facility] Hematology Oncology 02/26/2023    CHIEF COMPLAINTS/REASON FOR VISIT:  Essential thrombocythemia  HISTORY OF PRESENTING ILLNESS:  Tiffany Richardson is a 77 y.o. female who was seen in consultation at the request of Baity, Salvadore Oxford, NP for evaluation of thromcbocytosis Reviewed patient's labs.  08/07/2020 labs showed elevated platelet counts at 904,000.  Normal wbc  and hemoglobin   Reviewed patient's previous labs. Thrombocytosis onset is chronic onset , duration since at least 2017 No aggravating or elevated factors. Associated symptoms or signs:  Denies weight loss, fever, chills, fatigue, night sweats.  Context:  Smoking history: Denies Family history of polycythemia.  Denies.  However his father had a history of needing frequent blood removal History of iron deficiency  anemia: Denies History of DVT: Denies She also reports chronic abdominal bloating.  She has IBS.  Denies any unintentional weight loss, fever, nausea vomiting.  No recent colonoscopy in current EMR. She is widowed, husband had colon cancer.  # Jak 2 V61F mutation positive 08/21/2020 bone marrow biopsy results showed hypercellular bone marrow for age with features of myeloproliferative neoplasm.  Consistent with essential thrombocythemia, iron deficient polycythemia vera, pretty fibrotic/early primary myelofibrosis. Patient has no erythrocytosis.  No chronic leukocytosis.  Clinically most consistent with essential thrombocythemia.  # Chronic bloating and abdominal discomfort, possible due to chronic IBS symptoms.  Last colonoscopy 2013. Patient was seen by Dr. Mechele Collin in 2019 and was recommended to increase soluble fiber, MiraLAX, if not helpful trial of FODMAP diet and probiotics.  Discussed with patient that I recommend surveillance colonoscopy.  07/06/2021, CT scan showed constipation.  Otherwise no acute intra-a patient bdominal or intrapelvic abnormality. Reports alternate of constipation and diarrhea.  Not associated with any food or medication.  INTERVAL HISTORY Tiffany Richardson is a 77 y.o. female who has above history reviewed by me today presents for follow up visit for management of essential thrombocythemia Patient has been on  hydroxyurea to 1000mg  2 day per week, and 500mg  daily the rest of the days.   Patient tolerates well. Chronic sinus issue.    Review of Systems  Constitutional:  Negative for appetite change, chills, fatigue and fever.  HENT:   Negative for hearing loss and voice change.   Eyes:  Negative for eye  problems.  Respiratory:  Negative for chest tightness and cough.   Cardiovascular:  Negative for chest pain.  Gastrointestinal:  Negative for abdominal distention, abdominal pain and blood in stool.  Endocrine: Negative for hot flashes.  Genitourinary:  Negative for  difficulty urinating and frequency.   Musculoskeletal:  Negative for arthralgias.  Skin:  Negative for itching and rash.  Neurological:  Negative for extremity weakness.  Hematological:  Negative for adenopathy.  Psychiatric/Behavioral:  Negative for confusion.     MEDICAL HISTORY:  Past Medical History:  Diagnosis Date   Allergy    Cancer (HCC)    basal cell   Emphysema of lung (HCC)    Hypertension     SURGICAL HISTORY: Past Surgical History:  Procedure Laterality Date   basal cell removed     Rt eye   BIOPSY  12/19/2022   Procedure: BIOPSY;  Surgeon: Jaynie Collins, DO;  Location: [facility] ENDOSCOPY;  Service: Gastroenterology;;   COLONOSCOPY     COLONOSCOPY WITH PROPOFOL N/A 12/19/2022   Procedure: COLONOSCOPY WITH PROPOFOL;  Surgeon: Jaynie Collins, DO;  [facility];  Service: Gastroenterology;  Laterality: N/A;   ESOPHAGOGASTRODUODENOSCOPY (EGD) WITH PROPOFOL N/A 12/19/2022   Procedure: ESOPHAGOGASTRODUODENOSCOPY (EGD) WITH PROPOFOL;  Surgeon: Jaynie Collins, DO;  [facility];  Service: Gastroenterology;  Laterality: N/A;   POLYPECTOMY  12/19/2022   Procedure: POLYPECTOMY;  Surgeon: Jaynie Collins, DO;  [facility] END[facility]COPY;  Service: Gastroenterology;;   squamous cell removed       SOCIAL HISTORY: Social History   Socioeconomic History   Marital status: Widowed    Spouse name: Not on file   Number of children: Not on file   Years of education: Not on file   Highest education level: GED or equivalent  Occupational History   Not on file  Tobacco Use   Smoking status: Never   Smokeless tobacco: Never  Vaping Use   Vaping status: Never Used  Substance and Sexual Activity   Alcohol use: No    Alcohol/week: 0.0 standard drinks of alcohol   Drug use: No   Sexual activity: Not on file  Other Topics Concern   Not on file  Social History Narrative   Not on file   Social Determinants of Health   Financial  Resource Strain: Not on file  Food Insecurity: No Food Insecurity (02/15/2023)   Hunger Vital Sign    Worried About Running Out of Food in the Last Year: Never true    Ran Out of Food in the Last Year: Never true  Transportation Needs: No Transportation Needs (02/15/2023)   PRAPARE - Administrator, Civil Service (Medical): No    Lack of Transportation (Non-Medical): No  Physical Activity: Insufficiently Active (02/15/2023)   Exercise Vital Sign    Days of Exercise per Week: 3 days    Minutes of Exercise per Session: 30 min  Stress: No Stress Concern Present (02/15/2023)   [employer] of Occupational Health - Occupational Stress Questionnaire    Feeling of Stress : Only a little  Social Connections: Moderately Integrated (02/15/2023)   Social Connection and Isolation Panel [NHANES]    Frequency of Communication with Friends and Family: More than three times a week    Frequency of Social Gatherings with Friends and Family: More than three times a week    Attends Religious Services: More than 4 times per year    Active Member of Clubs or Organizations: Yes    Attends  Club or Organization Meetings: 1 to 4 times per year    Marital Status: Widowed  Catering manager Violence: Not on file    FAMILY HISTORY: Family History  Problem Relation Age of Onset   Leukemia Mother    Heart disease Father    Non-Hodgkin's lymphoma Daughter     ALLERGIES:  is allergic to codeine.  MEDICATIONS:  Current Outpatient Medications  Medication Sig Dispense Refill   aspirin EC 81 MG tablet Take 81 mg by mouth.     atorvastatin (LIPITOR) 40 MG tablet TAKE 1 TABLET BY MOUTH EVERY DAY 90 tablet 1   dicyclomine (BENTYL) 10 MG capsule Take 1 capsule (10 mg total) by mouth 4 (four) times daily -  before meals and at bedtime. 270 capsule 1   fluticasone (FLONASE) 50 MCG/ACT nasal spray SPRAY 2 SPRAYS INTO EACH NOSTRIL EVERY DAY 48 mL 0   hydroxyurea (HYDREA) 500 MG capsule Take 1 capsule  (500 mg total) by mouth See admin instructions. Take 1000mg  on Wednesdays and Sundays, and take 500mg  daily for the rest of the week. 94 capsule 2   losartan (COZAAR) 25 MG tablet Take 1 tablet (25 mg total) by mouth daily. 90 tablet 1   metoprolol succinate (TOPROL-XL) 50 MG 24 hr tablet TAKE 1 TABLET BY MOUTH EVERY DAY WITH OR IMMEDIATELY FOLLOWING A MEAL 90 tablet 0   sucralfate (CARAFATE) 1 g tablet Take by mouth.     No current facility-administered medications for this visit.     PHYSICAL EXAMINATION: ECOG PERFORMANCE STATUS: 0 - Asymptomatic Vitals:   02/26/23 1045  BP: 126/78  Pulse: 85  Resp: 18  Temp: 98.1 F (36.7 C)   Filed Weights   02/26/23 1045  Weight: 148 lb 3.2 oz (67.2 kg)    Physical Exam Constitutional:      General: She is not in acute distress. HENT:     Head: Normocephalic.  Eyes:     General: No scleral icterus. Cardiovascular:     Rate and Rhythm: Normal rate.  Pulmonary:     Effort: Pulmonary effort is normal. No respiratory distress.  Abdominal:     General: There is no distension.  Musculoskeletal:        General: No deformity. Normal range of motion.     Cervical back: Normal range of motion.  Skin:    General: Skin is warm and dry.     Findings: No erythema or rash.  Neurological:     Mental Status: She is alert and oriented to person, place, and time. Mental status is at baseline.     Cranial Nerves: No cranial nerve deficit.     Coordination: Coordination normal.  Psychiatric:        Mood and Affect: Mood normal.      LABORATORY DATA:  I have reviewed the data as listed    Latest Ref Rng & Units 02/26/2023   10:31 AM 11/26/2022    2:27 PM 08/21/2022    1:49 PM  CBC  WBC 4.0 - 10.5 K/uL 6.3  7.0  7.6   Hemoglobin 12.0 - 15.0 g/dL 16.1  09.6  04.5   Hematocrit 36.0 - 46.0 % 38.3  39.5  38.6   Platelets 150 - 400 K/uL 482  485  440       Latest Ref Rng & Units 02/26/2023   10:31 AM 11/26/2022    2:27 PM 08/21/2022    1:49 PM   CMP  Glucose 70 - 99 mg/dL  109  99  85   BUN 8 - 23 mg/dL 12  12  16    Creatinine 0.44 - 1.00 mg/dL 7.82  9.56  2.13   Sodium 135 - 145 mmol/L 135  138  136   Potassium 3.5 - 5.1 mmol/L 3.5  4.6  4.4   Chloride 98 - 111 mmol/L 103  105  104   CO2 22 - 32 mmol/L 24  26  27    Calcium 8.9 - 10.3 mg/dL 8.6  8.6  8.6   Total Protein 6.5 - 8.1 g/dL 7.0  6.8  6.5   Total Bilirubin <1.2 mg/dL 0.9  0.7  0.8   Alkaline Phos 38 - 126 U/L 134  134  125   AST 15 - 41 U/L 20  24  20    ALT 0 - 44 U/L 15  21  16     Lab Results  Component Value Date   IRON 85 08/15/2020   TIBC 337 08/15/2020   FERRITIN 31 08/15/2020       RADIOGRAPHIC STUDIES: I have personally reviewed the radiological images as listed and agreed with the findings in the report. No results found.

## 2023-02-26 NOTE — Assessment & Plan Note (Signed)
Calcium is chronically decreased.  Stable. Recommend patient to continue calcium supplementation.

## 2023-03-06 DIAGNOSIS — S0100XA Unspecified open wound of scalp, initial encounter: Secondary | ICD-10-CM | POA: Diagnosis not present

## 2023-03-14 DIAGNOSIS — M1712 Unilateral primary osteoarthritis, left knee: Secondary | ICD-10-CM | POA: Diagnosis not present

## 2023-03-14 DIAGNOSIS — S8392XA Sprain of unspecified site of left knee, initial encounter: Secondary | ICD-10-CM | POA: Diagnosis not present

## 2023-03-26 DIAGNOSIS — S8392XD Sprain of unspecified site of left knee, subsequent encounter: Secondary | ICD-10-CM | POA: Diagnosis not present

## 2023-03-26 DIAGNOSIS — M1712 Unilateral primary osteoarthritis, left knee: Secondary | ICD-10-CM | POA: Diagnosis not present

## 2023-04-10 ENCOUNTER — Other Ambulatory Visit: Payer: Self-pay | Admitting: Internal Medicine

## 2023-04-10 DIAGNOSIS — Z48817 Encounter for surgical aftercare following surgery on the skin and subcutaneous tissue: Secondary | ICD-10-CM | POA: Diagnosis not present

## 2023-04-10 DIAGNOSIS — I10 Essential (primary) hypertension: Secondary | ICD-10-CM

## 2023-04-11 NOTE — Telephone Encounter (Signed)
Metoprolol and omeprazole: The original prescription was discontinued on 08/21/2022 by Paulita Fujita, CMA.   Requested Prescriptions  Pending Prescriptions Disp Refills   metoprolol succinate (TOPROL-XL) 50 MG 24 hr tablet [Pharmacy Med Name: METOPROLOL SUCC ER 50 MG TAB] 90 tablet 0    Sig: TAKE 1 TABLET BY MOUTH EVERY DAY WITH OR IMMEDIATELY FOLLOWING A MEAL     Cardiovascular:  Beta Blockers Passed - 04/11/2023  9:46 AM      Passed - Last BP in normal range    BP Readings from Last 1 Encounters:  02/26/23 126/78         Passed - Last Heart Rate in normal range    Pulse Readings from Last 1 Encounters:  02/26/23 85         Passed - Valid encounter within last 6 months    Recent Outpatient Visits           1 month ago Primary hypertension   [place] [facility] [place], Salvadore Oxford, NP   2 months ago Aortic atherosclerosis [facility])   [place] [facility] [place], Salvadore Oxford, NP   10 months ago Mixed hyperlipidemia   [place] [facility] [place], Salvadore Oxford, NP   1 year ago Acute bacterial sinusitis   [place] [facility] [place], [place] W, NP   1 year ago Irritable bowel syndrome with both constipation and diarrhea   [place] [facility] [place], Salvadore Oxford, NP       Future Appointments             In 4 months [place], Salvadore Oxford, NP [place] [facility]             omeprazole (PRILOSEC) 40 MG capsule [Pharmacy Med Name: OMEPRAZOLE DR 40 MG CAPSULE] 180 capsule 1    Sig: TAKE 1 CAPSULE (40 MG TOTAL) BY MOUTH IN THE MORNING AND AT BEDTIME.     Gastroenterology: Proton Pump Inhibitors Passed - 04/11/2023  9:46 AM      Passed - Valid encounter within last 12 months    Recent Outpatient Visits           1 month ago Primary hypertension   [place] [facility] [place], Salvadore Oxford, NP   2 months ago Aortic atherosclerosis [facility])   [place] [facility] [place], Salvadore Oxford, NP   10 months ago Mixed hyperlipidemia   [place] [facility] [place], Salvadore Oxford, NP   1 year ago Acute bacterial sinusitis   [place] [facility] [place], [place] W, NP   1 year ago Irritable bowel syndrome with both constipation and diarrhea   [facility] [place], Salvadore Oxford, NP       Future Appointments             In 4 months [place], Salvadore Oxford, NP [place] [facility]             pantoprazole (PROTONIX) 40 MG tablet [Pharmacy Med Name: PANTOPRAZOLE SOD DR 40 MG TAB] 90 tablet 0    Sig: TAKE 1 TABLET BY MOUTH EVERY DAY     Gastroenterology: Proton Pump Inhibitors Passed - 04/11/2023  9:46 AM      Passed - Valid encounter within last 12 months    Recent Outpatient Visits  1 month ago Primary hypertension   [place] [facility] [place], Salvadore Oxford, NP   2 months ago Aortic atherosclerosis [facility])   [place] [facility] [place], Salvadore Oxford, NP   10 months ago Mixed hyperlipidemia   [place] [facility] [place], Salvadore Oxford, NP   1 year ago Acute bacterial sinusitis   [place] [place], [place], NP   1 year ago Irritable bowel syndrome with both constipation and diarrhea   [place] [facility] [place], [place], NP       Future Appointments             In 4 months [place], Salvadore Oxford, NP [place] [facility]             atorvastatin (LIPITOR) 40 MG tablet [Pharmacy Med Name: ATORVASTATIN 40 MG TABLET] 90 tablet 1    Sig: TAKE 1 TABLET BY MOUTH EVERY DAY     Cardiovascular:  Antilipid - Statins Failed - 04/11/2023  9:46 AM      Failed - Lipid Panel in normal range within the last 12 months    Cholesterol, Total  Date Value Ref Range Status  10/11/2016 220 (H) 100 - 199 mg/dL Final   Cholesterol  Date Value Ref Range Status   09/02/2022 124 <200 mg/dL Final   LDL Cholesterol (Calc)  Date Value Ref Range Status  09/02/2022 65 mg/dL (calc) Final    Comment:    Reference range: <100 . Desirable range <100 mg/dL for primary prevention;   <70 mg/dL for patients with CHD or diabetic patients  with > or = 2 CHD risk factors. Marland Kitchen LDL-C is now calculated using the Martin-Hopkins  calculation, which is a validated novel method providing  better accuracy than the Friedewald equation in the  estimation of LDL-C.  Horald Pollen et al. [place] Ahr. 7564;332(95): 2061-2068  (http://education.[employer].com/faq/FAQ164)    HDL  Date Value Ref Range Status  09/02/2022 34 (L) > OR = 50 mg/dL Final  18/84/1660 42 >63 mg/dL Final   Triglycerides  Date Value Ref Range Status  09/02/2022 168 (H) <150 mg/dL Final         Passed - Patient is not pregnant      Passed - Valid encounter within last 12 months    Recent Outpatient Visits           1 month ago Primary hypertension   [place] [facility] [place], Salvadore Oxford, NP   2 months ago Aortic atherosclerosis [facility])   [place] [facility] [place], Salvadore Oxford, NP   10 months ago Mixed hyperlipidemia   [place] [facility] [place], Salvadore Oxford, NP   1 year ago Acute bacterial sinusitis   [place] [facility] [place], [place] W, NP   1 year ago Irritable bowel syndrome with both constipation and diarrhea   [place] [facility] [place], Salvadore Oxford, NP       Future Appointments             In 4 months Baity, Salvadore Oxford, NP [place] [facility]

## 2023-04-23 DIAGNOSIS — K8020 Calculus of gallbladder without cholecystitis without obstruction: Secondary | ICD-10-CM

## 2023-04-23 HISTORY — DX: Calculus of gallbladder without cholecystitis without obstruction: K80.20

## 2023-05-22 ENCOUNTER — Ambulatory Visit (INDEPENDENT_AMBULATORY_CARE_PROVIDER_SITE_OTHER): Payer: Medicare HMO | Admitting: Internal Medicine

## 2023-05-22 VITALS — BP 124/78 | HR 89 | Temp 98.7°F | Ht 64.0 in | Wt 143.8 lb

## 2023-05-22 DIAGNOSIS — J0110 Acute frontal sinusitis, unspecified: Secondary | ICD-10-CM | POA: Diagnosis not present

## 2023-05-22 MED ORDER — DOXYCYCLINE HYCLATE 100 MG PO TABS: 100.0000 mg | ORAL_TABLET | Freq: Two times a day (BID) | ORAL | 0 refills | Status: DC

## 2023-05-22 NOTE — Progress Notes (Signed)
Subjective:    Patient ID: Tiffany Richardson, female    DOB: [birth year], 78 y.o.   MRN: [id number]  HPI  Discussed the use of AI scribe software for clinical note transcription with the patient, who gave verbal consent to proceed.  The patient presents with sinus pressure and related symptoms.  She has been experiencing sinus pressure for a little over a week, accompanied by headaches and a sensation of fullness in the ears. There is also dental pain attributed to the sinus pressure.  Nasal symptoms include epistaxis when blowing her nose, which she associates with the dry winter air. She has a cough without sputum production and believes she had a slight fever recently.  She has a history of being prone to sinus infections and typically finds relief with antibiotics.  She has tried DayQuil OTC for symptom relief.  She has been in contact with others who have been sick, mentioning attending church and a kid scout event.  No nausea, vomiting, diarrhea, or shortness of breath.       Review of Systems   Past Medical History:  Diagnosis Date   Allergy    Cancer (HCC)    basal cell   Emphysema of lung (HCC)    Hypertension     Current Outpatient Medications  Medication Sig Dispense Refill   aspirin EC 81 MG tablet Take 81 mg by mouth.     atorvastatin (LIPITOR) 40 MG tablet TAKE 1 TABLET BY MOUTH EVERY DAY 90 tablet 1   dicyclomine (BENTYL) 10 MG capsule Take 1 capsule (10 mg total) by mouth 4 (four) times daily -  before meals and at bedtime. 270 capsule 1   fluticasone (FLONASE) 50 MCG/ACT nasal spray SPRAY 2 SPRAYS INTO EACH NOSTRIL EVERY DAY 48 mL 0   hydroxyurea (HYDREA) 500 MG capsule Take 1 capsule (500 mg total) by mouth See admin instructions. Take 1000mg  on Wednesdays and Sundays, and take 500mg  daily for the rest of the week. 94 capsule 2   losartan (COZAAR) 25 MG tablet Take 1 tablet (25 mg total) by mouth daily. 90 tablet 1   metoprolol succinate (TOPROL-XL) 50 MG 24 hr  tablet TAKE 1 TABLET BY MOUTH EVERY DAY WITH OR IMMEDIATELY FOLLOWING A MEAL 90 tablet 1   sucralfate (CARAFATE) 1 g tablet Take by mouth.     No current facility-administered medications for this visit.    Allergies  Allergen Reactions   Codeine Swelling    Family History  Problem Relation Age of Onset   Leukemia Mother    Heart disease Father    Non-Hodgkin's lymphoma Daughter     Social History   Socioeconomic History   Marital status: Widowed    Spouse name: Not on file   Number of children: Not on file   Years of education: Not on file   Highest education level: 12th grade  Occupational History   Not on file  Tobacco Use   Smoking status: Never   Smokeless tobacco: Never  Vaping Use   Vaping status: Never Used  Substance and Sexual Activity   Alcohol use: No    Alcohol/week: 0.0 standard drinks of alcohol   Drug use: No   Sexual activity: Not on file  Other Topics Concern   Not on file  Social History Narrative   Not on file   Social Drivers of Health   Financial Resource Strain: Low Risk  (05/22/2023)   Overall Financial Resource Strain (CARDIA)    Difficulty  of Paying Living Expenses: Not hard at all  Food Insecurity: No Food Insecurity (05/22/2023)   Hunger Vital Sign    Worried About Running Out of Food in the Last Year: Never true    Ran Out of Food in the Last Year: Never true  Transportation Needs: No Transportation Needs (05/22/2023)   PRAPARE - Administrator, Civil Service (Medical): No    Lack of Transportation (Non-Medical): No  Physical Activity: Insufficiently Active (05/22/2023)   Exercise Vital Sign    Days of Exercise per Week: 4 days    Minutes of Exercise per Session: 30 min  Stress: No Stress Concern Present (05/22/2023)   [employer] of Occupational Health - Occupational Stress Questionnaire    Feeling of Stress : Only a little  Social Connections: Moderately Integrated (05/22/2023)   Social Connection and Isolation  Panel [NHANES]    Frequency of Communication with Friends and Family: More than three times a week    Frequency of Social Gatherings with Friends and Family: Twice a week    Attends Religious Services: More than 4 times per year    Active Member of [employer] or Organizations: Yes    Attends Banker Meetings: More than 4 times per year    Marital Status: Widowed  Intimate Partner Violence: Not on file     Constitutional: Pt reports headache, fever. Denies malaise, fatigue, or abrupt weight changes.  HEENT: Pt reports sinus pressure, nasal congestion and ear fullness. Denies eye pain, eye redness, ear pain, ringing in the ears, wax buildup, runny nose, or sore throat. Respiratory: Pt reports cough. Denies difficulty breathing, shortness of breath, or sputum production.   Cardiovascular: Denies chest pain, chest tightness, palpitations or swelling in the hands or feet.  Gastrointestinal: Denies abdominal pain, bloating, constipation, diarrhea or blood in the stool.  Musculoskeletal: Denies decrease in range of motion, difficulty with gait, muscle pain or joint pain and swelling.  Skin: Denies redness, rashes, lesions or ulcercations.  Neurological: Denies dizziness, difficulty with memory, difficulty with speech or problems with balance and coordination.    No other specific complaints in a complete review of systems (except as listed in HPI above).      Objective:   Physical Exam BP 124/78 (BP Location: Left Arm, Patient Position: Sitting, Cuff Size: Normal)   Pulse 89   Temp 98.7 F (37.1 C)   Ht 5\' 4"  (1.626 m)   Wt 143 lb 12.8 oz (65.2 kg)   SpO2 95%   BMI 24.68 kg/m   Wt Readings from Last 3 Encounters:  02/26/23 148 lb 3.2 oz (67.2 kg)  02/19/23 146 lb (66.2 kg)  02/05/23 145 lb (65.8 kg)    General: Appears her stated age, well developed, well nourished in NAD. Skin: Warm, dry and intact.  HEENT: Head: normal shape and size, frontal sinus tenderness noted;  Eyes: sclera white, no icterus, conjunctiva pink, PERRLA and EOMs intact; Nose: mucosa pink and moist, septum midline; Throat/Mouth: Teeth present, mucosa pink and moist, + PND, no exudate, lesions or ulcerations noted.  Neck:  No adenopathy noted.  Cardiovascular: Normal rate and rhythm. S1,S2 noted.  No murmur, rubs or gallops noted. Pulmonary/Chest: Normal effort and positive vesicular breath sounds. No respiratory distress. No wheezes, rales or ronchi noted.  Neurological: Alert and oriented.   BMET    Component Value Date/Time   NA 135 02/26/2023 1031   NA 142 10/11/2016 0932   K 3.5 02/26/2023 1031   CL  103 02/26/2023 1031   CO2 24 02/26/2023 1031   GLUCOSE 109 (H) 02/26/2023 1031   BUN 12 02/26/2023 1031   BUN 12 10/11/2016 0932   CREATININE 0.91 02/26/2023 1031   CREATININE 0.90 04/02/2021 0840   CALCIUM 8.6 (L) 02/26/2023 1031   GFRNONAA >60 02/26/2023 1031   GFRNONAA 63 08/07/2020 1001   GFRAA 74 08/07/2020 1001    Lipid Panel     Component Value Date/Time   CHOL 124 09/02/2022 0810   CHOL 220 (H) 10/11/2016 0932   TRIG 168 (H) 09/02/2022 0810   HDL 34 (L) 09/02/2022 0810   HDL 42 10/11/2016 0932   CHOLHDL 3.6 09/02/2022 0810   LDLCALC 65 09/02/2022 0810    CBC    Component Value Date/Time   WBC 6.3 02/26/2023 1031   WBC 7.6 08/21/2022 1349   RBC 3.38 (L) 02/26/2023 1031   HGB 13.1 02/26/2023 1031   HGB 13.0 05/29/2015 1404   HCT 38.3 02/26/2023 1031   HCT 39.0 05/29/2015 1404   PLT 482 (H) 02/26/2023 1031   PLT 684 (H) 05/29/2015 1404   MCV 113.3 (H) 02/26/2023 1031   MCV 90 05/29/2015 1404   MCH 38.8 (H) 02/26/2023 1031   MCHC 34.2 02/26/2023 1031   RDW 12.8 02/26/2023 1031   RDW 13.6 05/29/2015 1404   LYMPHSABS 1.6 02/26/2023 1031   LYMPHSABS 3.5 (H) 05/29/2015 1404   MONOABS 0.4 02/26/2023 1031   EOSABS 0.3 02/26/2023 1031   EOSABS 0.3 05/29/2015 1404   BASOSABS 0.1 02/26/2023 1031   BASOSABS 0.1 05/29/2015 1404    Hgb A1C No results  found for: "HGBA1C"          Assessment & Plan:  Assessment and Plan    Sinusitis Symptoms of headache, sinus pressure, and nasal congestion for over a week. History of recurrent sinus infections. Physical exam consistent with sinusitis. -Prescribe Doxycycline 100mg  twice daily for 10 days. -Recommend Zyrtec and Flonase to help dry up mucus.     RTC in 4 months for your annual exam Nicki Reaper, NP

## 2023-05-22 NOTE — Patient Instructions (Signed)

## 2023-05-29 ENCOUNTER — Encounter: Payer: Self-pay | Admitting: Oncology

## 2023-05-29 ENCOUNTER — Inpatient Hospital Stay: Payer: Medicare HMO | Attending: Oncology

## 2023-05-29 ENCOUNTER — Inpatient Hospital Stay: Payer: Medicare HMO | Admitting: Oncology

## 2023-05-29 VITALS — BP 136/78 | HR 72 | Temp 96.8°F | Resp 18 | Wt 140.0 lb

## 2023-05-29 DIAGNOSIS — D473 Essential (hemorrhagic) thrombocythemia: Secondary | ICD-10-CM | POA: Diagnosis present

## 2023-05-29 DIAGNOSIS — Z7964 Long term (current) use of myelosuppressive agent: Secondary | ICD-10-CM | POA: Diagnosis not present

## 2023-05-29 DIAGNOSIS — R748 Abnormal levels of other serum enzymes: Secondary | ICD-10-CM | POA: Diagnosis not present

## 2023-05-29 LAB — CMP (CANCER CENTER ONLY)
ALT: 13 U/L (ref 0–44)
AST: 18 U/L (ref 15–41)
Albumin: 3.3 g/dL — ABNORMAL LOW (ref 3.5–5.0)
Alkaline Phosphatase: 121 U/L (ref 38–126)
Anion gap: 8 (ref 5–15)
BUN: 14 mg/dL (ref 8–23)
CO2: 26 mmol/L (ref 22–32)
Calcium: 8.5 mg/dL — ABNORMAL LOW (ref 8.9–10.3)
Chloride: 105 mmol/L (ref 98–111)
Creatinine: 0.89 mg/dL (ref 0.44–1.00)
GFR, Estimated: >60 mL/min (ref >60)
Glucose, Bld: 90 mg/dL (ref 70–99)
Potassium: 3.8 mmol/L (ref 3.5–5.1)
Sodium: 139 mmol/L (ref 135–145)
Total Bilirubin: 1 mg/dL (ref 0.0–1.2)
Total Protein: 6.6 g/dL (ref 6.5–8.1)

## 2023-05-29 LAB — CBC WITH DIFFERENTIAL (CANCER CENTER ONLY)
Abs Immature Granulocytes: 0.02 10*3/uL (ref 0.00–0.07)
Basophils Absolute: 0.1 10*3/uL (ref 0.0–0.1)
Basophils Relative: 1 %
Eosinophils Absolute: 0.1 10*3/uL (ref 0.0–0.5)
Eosinophils Relative: 2 %
HCT: 37.7 % (ref 36.0–46.0)
Hemoglobin: 12.9 g/dL (ref 12.0–15.0)
Immature Granulocytes: 0 %
Lymphocytes Relative: 42 %
Lymphs Abs: 2 10*3/uL (ref 0.7–4.0)
MCH: 37.8 pg — ABNORMAL HIGH (ref 26.0–34.0)
MCHC: 34.2 g/dL (ref 30.0–36.0)
MCV: 110.6 fL — ABNORMAL HIGH (ref 80.0–100.0)
Monocytes Absolute: 0.4 10*3/uL (ref 0.1–1.0)
Monocytes Relative: 8 %
Neutro Abs: 2.2 10*3/uL (ref 1.7–7.7)
Neutrophils Relative %: 47 %
Platelet Count: 430 10*3/uL — ABNORMAL HIGH (ref 150–400)
RBC: 3.41 MIL/uL — ABNORMAL LOW (ref 3.87–5.11)
RDW: 13.8 % (ref 11.5–15.5)
WBC Count: 4.7 10*3/uL (ref 4.0–10.5)
nRBC: 0 % (ref 0.0–0.2)

## 2023-05-29 MED ORDER — HYDROXYUREA 500 MG PO CAPS: 500.0000 mg | ORAL_CAPSULE | ORAL | 2 refills | Status: DC

## 2023-05-29 NOTE — Assessment & Plan Note (Signed)
 Mildly increased level has normalized.

## 2023-05-29 NOTE — Progress Notes (Signed)
 Hematology/Oncology Progress note [phone] [phone]      Patient Care Team: Antonette Angeline ORN, NP as PCP - General (Internal Medicine)  ASSESSMENT & PLAN:   Essential thrombocythemia [facility]) Labs reviewed and discussed with patient Stable platelet counts.  Continue hydroxyurea  to 1000mg  on 2 days per week, and 500mg  daily the rest of the days.   Elevated alkaline phosphatase level Mildly increased level has normalized.    Orders Placed This Encounter  Procedures   CBC with Differential (Cancer Center Only)    Standing Status:   Future    Expected Date:   08/26/2023    Expiration Date:   05/28/2024   CMP (Cancer Center only)    Standing Status:   Future    Expected Date:   08/26/2023    Expiration Date:   05/28/2024    Follow up in 3 months.   All questions were answered. The patient knows to call the clinic with any problems, questions or concerns.  Zelphia Cap, MD, PhD [facility] Hematology Oncology 05/29/2023    CHIEF COMPLAINTS/REASON FOR VISIT:  Essential thrombocythemia  HISTORY OF PRESENTING ILLNESS:  Tiffany Richardson is a 78 y.o. female who was seen in consultation at the request of Baity, Angeline ORN, NP for evaluation of thromcbocytosis Reviewed patient's labs.  08/07/2020 labs showed elevated platelet counts at 904,000.  Normal wbc  and hemoglobin   Reviewed patient's previous labs. Thrombocytosis onset is chronic onset , duration since at least 2017 No aggravating or elevated factors. Associated symptoms or signs:  Denies weight loss, fever, chills, fatigue, night sweats.  Context:  Smoking history: Denies Family history of polycythemia.  Denies.  However his father had a history of needing frequent blood removal History of iron deficiency anemia: Denies History of DVT: Denies She also reports chronic abdominal bloating.  She has IBS.  Denies any unintentional weight loss, fever, nausea vomiting.  No recent colonoscopy in current EMR. She is  widowed, husband had colon cancer.  # Jak 2 V61F mutation positive 08/21/2020 bone marrow biopsy results showed hypercellular bone marrow for age with features of myeloproliferative neoplasm.  Consistent with essential thrombocythemia, iron deficient polycythemia vera, pretty fibrotic/early primary myelofibrosis. Patient has no erythrocytosis.  No chronic leukocytosis.  Clinically most consistent with essential thrombocythemia.  # Chronic bloating and abdominal discomfort, possible due to chronic IBS symptoms.  Last colonoscopy 2013. Patient was seen by Dr. Viktoria in 2019 and was recommended to increase soluble fiber, MiraLAX, if not helpful trial of FODMAP diet and probiotics.  Discussed with patient that I recommend surveillance colonoscopy.  07/06/2021, CT scan showed constipation.  Otherwise no acute intra-a patient bdominal or intrapelvic abnormality. Reports alternate of constipation and diarrhea.  Not associated with any food or medication.  INTERVAL HISTORY Tiffany Richardson is a 78 y.o. female who has above history reviewed by me today presents for follow up visit for management of essential thrombocythemia Patient has been on  hydroxyurea  to 1000mg  2 day per week, and 500mg  daily the rest of the days.   Patient tolerates well. Chronic sinus issue.    Review of Systems  Constitutional:  Negative for appetite change, chills, fatigue and fever.  HENT:   Negative for hearing loss and voice change.   Eyes:  Negative for eye problems.  Respiratory:  Negative for chest tightness and cough.   Cardiovascular:  Negative for chest pain.  Gastrointestinal:  Negative for abdominal distention, abdominal pain and blood in stool.  Endocrine: Negative  for hot flashes.  Genitourinary:  Negative for difficulty urinating and frequency.   Musculoskeletal:  Negative for arthralgias.  Skin:  Negative for itching and rash.  Neurological:  Negative for extremity weakness.  Hematological:  Negative for  adenopathy.  Psychiatric/Behavioral:  Negative for confusion.     MEDICAL HISTORY:  Past Medical History:  Diagnosis Date   Allergy    Cancer (HCC)    basal cell   Emphysema of lung (HCC)    Hypertension     SURGICAL HISTORY: Past Surgical History:  Procedure Laterality Date   basal cell removed     Rt eye   BIOPSY  12/19/2022   Procedure: BIOPSY;  Surgeon: Onita Elspeth Sharper, DO;  Location[facility] ENDOSCOPY;  Service: Gastroenterology;;   COLONOSCOPY     COLONOSCOPY WITH PROPOFOL  N/A 12/19/2022   Procedure: COLONOSCOPY WITH PROPOFOL ;  Surgeon: Onita Elspeth Sharper, DO;  Location: [facility] ENDOSCOPY;  Service: Gastroenterology;  Laterality: N/A;   ESOPHAGOGASTRODUODENOSCOPY (EGD) WITH PROPOFOL  N/A 12/19/2022   Procedure: ESOPHAGOGASTRODUODENOSCOPY (EGD) WITH PROPOFOL ;  Surgeon: Onita Elspeth Sharper, DO;  Location: [facility] ENDOSCOPY;  Service: Gastroenterology;  Laterality: N/A;   POLYPECTOMY  12/19/2022   Procedure: POLYPECTOMY;  Surgeon: Onita Elspeth Sharper, DO;  Location: [facility] ENDOSCOPY;  Service: Gastroenterology;;   squamous cell removed       SOCIAL HISTORY: Social History   Socioeconomic History   Marital status: Widowed    Spouse name: Not on file   Number of children: Not on file   Years of education: Not on file   Highest education level: 12th grade  Occupational History   Not on file  Tobacco Use   Smoking status: Never   Smokeless tobacco: Never  Vaping Use   Vaping status: Never Used  Substance and Sexual Activity   Alcohol use: No    Alcohol/week: 0.0 standard drinks of alcohol   Drug use: No   Sexual activity: Not on file  Other Topics Concern   Not on file  Social History Narrative   Not on file   Social Drivers of Health   Financial Resource Strain: Low Risk  (05/22/2023)   Overall Financial Resource Strain (CARDIA)    Difficulty of Paying Living Expenses: Not hard at all  Food Insecurity: No Food Insecurity (05/22/2023)   Hunger Vital Sign     Worried About Running Out of Food in the Last Year: Never true    Ran Out of Food in the Last Year: Never true  Transportation Needs: No Transportation Needs (05/22/2023)   PRAPARE - Administrator, Civil Service (Medical): No    Lack of Transportation (Non-Medical): No  Physical Activity: Insufficiently Active (05/22/2023)   Exercise Vital Sign    Days of Exercise per Week: 4 days    Minutes of Exercise per Session: 30 min  Stress: No Stress Concern Present (05/22/2023)   [employer] of Occupational Health - Occupational Stress Questionnaire    Feeling of Stress : Only a little  Social Connections: Moderately Integrated (05/22/2023)   Social Connection and Isolation Panel [NHANES]    Frequency of Communication with Friends and Family: More than three times a week    Frequency of Social Gatherings with Friends and Family: Twice a week    Attends Religious Services: More than 4 times per year    Active Member of [employer] or Organizations: Yes    Attends Banker Meetings: More than 4 times per year    Marital Status: Widowed  Intimate  Partner Violence: Not on file    FAMILY HISTORY: Family History  Problem Relation Age of Onset   Leukemia Mother    Heart disease Father    Non-Hodgkin's lymphoma Daughter     ALLERGIES:  is allergic to codeine.  MEDICATIONS:  Current Outpatient Medications  Medication Sig Dispense Refill   aspirin  EC 81 MG tablet Take 81 mg by mouth.     atorvastatin  (LIPITOR) 40 MG tablet TAKE 1 TABLET BY MOUTH EVERY DAY 90 tablet 1   dicyclomine  (BENTYL ) 10 MG capsule Take 1 capsule (10 mg total) by mouth 4 (four) times daily -  before meals and at bedtime. 270 capsule 1   doxycycline  (VIBRA -TABS) 100 MG tablet Take 1 tablet (100 mg total) by mouth 2 (two) times daily. 20 tablet 0   fluticasone  (FLONASE ) 50 MCG/ACT nasal spray SPRAY 2 SPRAYS INTO EACH NOSTRIL EVERY DAY 48 mL 0   hydroxyurea  (HYDREA ) 500 MG capsule Take 1 capsule (500 mg  total) by mouth See admin instructions. Take 1000mg  on Wednesdays and Sundays, and take 500mg  daily for the rest of the week. 94 capsule 2   losartan  (COZAAR ) 25 MG tablet Take 1 tablet (25 mg total) by mouth daily. 90 tablet 1   metoprolol  succinate (TOPROL -XL) 50 MG 24 hr tablet TAKE 1 TABLET BY MOUTH EVERY DAY WITH OR IMMEDIATELY FOLLOWING A MEAL 90 tablet 1   omeprazole  (PRILOSEC) 40 MG capsule TAKE 1 CAPSULE (40 MG TOTAL) BY MOUTH IN THE MORNING AND AT BEDTIME.     sucralfate (CARAFATE) 1 g tablet Take by mouth.     No current facility-administered medications for this visit.     PHYSICAL EXAMINATION: ECOG PERFORMANCE STATUS: 0 - Asymptomatic Vitals:   05/29/23 1332  BP: 136/78  Pulse: 72  Resp: 18  Temp: (!) 96.8 F (36 C)  SpO2: 99%   Filed Weights   05/29/23 1332  Weight: 140 lb (63.5 kg)    Physical Exam Constitutional:      General: She is not in acute distress. HENT:     Head: Normocephalic.  Eyes:     General: No scleral icterus. Cardiovascular:     Rate and Rhythm: Normal rate.  Pulmonary:     Effort: Pulmonary effort is normal. No respiratory distress.  Abdominal:     General: There is no distension.  Musculoskeletal:        General: No deformity. Normal range of motion.     Cervical back: Normal range of motion.  Skin:    General: Skin is warm and dry.     Findings: No erythema or rash.  Neurological:     Mental Status: She is alert and oriented to person, place, and time. Mental status is at baseline.     Cranial Nerves: No cranial nerve deficit.     Coordination: Coordination normal.  Psychiatric:        Mood and Affect: Mood normal.      LABORATORY DATA:  I have reviewed the data as listed    Latest Ref Rng & Units 05/29/2023    1:06 PM 02/26/2023   10:31 AM 11/26/2022    2:27 PM  CBC  WBC 4.0 - 10.5 K/uL 4.7  6.3  7.0   Hemoglobin 12.0 - 15.0 g/dL 87.0  86.8  86.4   Hematocrit 36.0 - 46.0 % 37.7  38.3  39.5   Platelets 150 - 400 K/uL 430   482  485       Latest  Ref Rng & Units 05/29/2023    1:06 PM 02/26/2023   10:31 AM 11/26/2022    2:27 PM  CMP  Glucose 70 - 99 mg/dL 90  890  99   BUN 8 - 23 mg/dL 14  12  12    Creatinine 0.44 - 1.00 mg/dL 9.10  9.08  8.94   Sodium 135 - 145 mmol/L 139  135  138   Potassium 3.5 - 5.1 mmol/L 3.8  3.5  4.6   Chloride 98 - 111 mmol/L 105  103  105   CO2 22 - 32 mmol/L 26  24  26    Calcium  8.9 - 10.3 mg/dL 8.5  8.6  8.6   Total Protein 6.5 - 8.1 g/dL 6.6  7.0  6.8   Total Bilirubin 0.0 - 1.2 mg/dL 1.0  0.9  0.7   Alkaline Phos 38 - 126 U/L 121  134  134   AST 15 - 41 U/L 18  20  24    ALT 0 - 44 U/L 13  15  21     Lab Results  Component Value Date   IRON 85 08/15/2020   TIBC 337 08/15/2020   FERRITIN 31 08/15/2020       RADIOGRAPHIC STUDIES: I have personally reviewed the radiological images as listed and agreed with the findings in the report. No results found.

## 2023-05-29 NOTE — Assessment & Plan Note (Signed)
 Labs reviewed and discussed with patient Stable platelet counts.  Continue hydroxyurea to 1000mg  on 2 days per week, and 500mg  daily the rest of the days.

## 2023-06-15 ENCOUNTER — Other Ambulatory Visit: Payer: Self-pay | Admitting: Internal Medicine

## 2023-06-15 DIAGNOSIS — J3089 Other allergic rhinitis: Secondary | ICD-10-CM

## 2023-06-17 NOTE — Telephone Encounter (Signed)
 Requested Prescriptions  Pending Prescriptions Disp Refills   fluticasone (FLONASE) 50 MCG/ACT nasal spray [Pharmacy Med Name: FLUTICASONE PROP 50 MCG SPRAY] 48 mL 0    Sig: SPRAY 2 SPRAYS INTO EACH NOSTRIL EVERY DAY     Ear, Nose, and Throat: Nasal Preparations - Corticosteroids Passed - 06/17/2023 10:03 AM      Passed - Valid encounter within last 12 months    Recent Outpatient Visits           3 weeks ago Acute non-recurrent frontal sinusitis   [facility] [place], Salvadore Oxford, NP   3 months ago Primary hypertension   [facility] [place], Salvadore Oxford, NP   4 months ago Aortic atherosclerosis [facility])   [facility] [place], Salvadore Oxford, NP   1 year ago Mixed hyperlipidemia   [facility] [place], Salvadore Oxford, NP   1 year ago Acute bacterial sinusitis   [facility] [place], Salvadore Oxford, NP       Future Appointments             In 2 months Baity, Salvadore Oxford, NP [facility]

## 2023-06-25 DIAGNOSIS — Z08 Encounter for follow-up examination after completed treatment for malignant neoplasm: Secondary | ICD-10-CM | POA: Diagnosis not present

## 2023-06-25 DIAGNOSIS — Z7189 Other specified counseling: Secondary | ICD-10-CM | POA: Diagnosis not present

## 2023-06-25 DIAGNOSIS — Z85828 Personal history of other malignant neoplasm of skin: Secondary | ICD-10-CM | POA: Diagnosis not present

## 2023-06-25 DIAGNOSIS — X32XXXA Exposure to sunlight, initial encounter: Secondary | ICD-10-CM | POA: Diagnosis not present

## 2023-06-25 DIAGNOSIS — L570 Actinic keratosis: Secondary | ICD-10-CM | POA: Diagnosis not present

## 2023-06-25 DIAGNOSIS — C44519 Basal cell carcinoma of skin of other part of trunk: Secondary | ICD-10-CM | POA: Diagnosis not present

## 2023-06-25 DIAGNOSIS — D485 Neoplasm of uncertain behavior of skin: Secondary | ICD-10-CM | POA: Diagnosis not present

## 2023-06-25 DIAGNOSIS — Z872 Personal history of diseases of the skin and subcutaneous tissue: Secondary | ICD-10-CM | POA: Diagnosis not present

## 2023-06-27 ENCOUNTER — Emergency Department

## 2023-06-27 ENCOUNTER — Other Ambulatory Visit: Payer: Self-pay

## 2023-06-27 ENCOUNTER — Other Ambulatory Visit: Payer: Self-pay | Admitting: Internal Medicine

## 2023-06-27 ENCOUNTER — Observation Stay
Admission: EM | Admit: 2023-06-27 | Discharge: 2023-06-30 | Disposition: A | Discharge status: Home / Self Care | Attending: Internal Medicine | Admitting: Internal Medicine

## 2023-06-27 DIAGNOSIS — I10 Essential (primary) hypertension: Secondary | ICD-10-CM | POA: Insufficient documentation

## 2023-06-27 DIAGNOSIS — E785 Hyperlipidemia, unspecified: Secondary | ICD-10-CM | POA: Insufficient documentation

## 2023-06-27 DIAGNOSIS — Z79899 Other long term (current) drug therapy: Secondary | ICD-10-CM | POA: Insufficient documentation

## 2023-06-27 DIAGNOSIS — I951 Orthostatic hypotension: Secondary | ICD-10-CM

## 2023-06-27 DIAGNOSIS — N390 Urinary tract infection, site not specified: Secondary | ICD-10-CM | POA: Diagnosis not present

## 2023-06-27 DIAGNOSIS — K219 Gastro-esophageal reflux disease without esophagitis: Secondary | ICD-10-CM | POA: Diagnosis not present

## 2023-06-27 DIAGNOSIS — Z1152 Encounter for screening for COVID-19: Secondary | ICD-10-CM | POA: Diagnosis not present

## 2023-06-27 DIAGNOSIS — D75839 Thrombocytosis, unspecified: Secondary | ICD-10-CM | POA: Insufficient documentation

## 2023-06-27 DIAGNOSIS — R42 Dizziness and giddiness: Secondary | ICD-10-CM | POA: Diagnosis not present

## 2023-06-27 DIAGNOSIS — Z7901 Long term (current) use of anticoagulants: Secondary | ICD-10-CM | POA: Diagnosis not present

## 2023-06-27 DIAGNOSIS — K8020 Calculus of gallbladder without cholecystitis without obstruction: Secondary | ICD-10-CM | POA: Diagnosis not present

## 2023-06-27 DIAGNOSIS — R1111 Vomiting without nausea: Secondary | ICD-10-CM | POA: Diagnosis not present

## 2023-06-27 DIAGNOSIS — E876 Hypokalemia: Secondary | ICD-10-CM | POA: Diagnosis not present

## 2023-06-27 DIAGNOSIS — R55 Syncope and collapse: Secondary | ICD-10-CM | POA: Diagnosis not present

## 2023-06-27 DIAGNOSIS — M47812 Spondylosis without myelopathy or radiculopathy, cervical region: Secondary | ICD-10-CM | POA: Diagnosis not present

## 2023-06-27 DIAGNOSIS — Z043 Encounter for examination and observation following other accident: Secondary | ICD-10-CM | POA: Diagnosis not present

## 2023-06-27 DIAGNOSIS — S0990XA Unspecified injury of head, initial encounter: Secondary | ICD-10-CM | POA: Diagnosis not present

## 2023-06-27 LAB — CBC
HCT: 37 % (ref 36.0–46.0)
Hemoglobin: 13 g/dL (ref 12.0–15.0)
MCH: 40.4 pg — ABNORMAL HIGH (ref 26.0–34.0)
MCHC: 35.1 g/dL (ref 30.0–36.0)
MCV: 114.9 fL — ABNORMAL HIGH (ref 80.0–100.0)
Platelets: 514 10*3/uL — ABNORMAL HIGH (ref 150–400)
RBC: 3.22 MIL/uL — ABNORMAL LOW (ref 3.87–5.11)
RDW: 14.8 % (ref 11.5–15.5)
WBC: 8.7 10*3/uL (ref 4.0–10.5)
nRBC: 0 % (ref 0.0–0.2)

## 2023-06-27 LAB — BASIC METABOLIC PANEL
Anion gap: 12 (ref 5–15)
BUN: 16 mg/dL (ref 8–23)
CO2: 23 mmol/L (ref 22–32)
Calcium: 8.7 mg/dL — ABNORMAL LOW (ref 8.9–10.3)
Chloride: 103 mmol/L (ref 98–111)
Creatinine, Ser: 0.94 mg/dL (ref 0.44–1.00)
GFR, Estimated: >60 mL/min (ref >60)
Glucose, Bld: 164 mg/dL — ABNORMAL HIGH (ref 70–99)
Potassium: 2.9 mmol/L — ABNORMAL LOW (ref 3.5–5.1)
Sodium: 138 mmol/L (ref 135–145)

## 2023-06-27 LAB — TROPONIN I (HIGH SENSITIVITY): Troponin I (High Sensitivity): 4 ng/L (ref <18)

## 2023-06-27 MED ORDER — SODIUM CHLORIDE 0.9 % IV BOLUS
1000.0000 mL | Freq: Once | INTRAVENOUS | Status: AC
  Administered 2023-06-27: 1000 mL via INTRAVENOUS

## 2023-06-27 MED ORDER — POTASSIUM CHLORIDE 10 MEQ/100ML IV SOLN
10.0000 meq | Freq: Once | INTRAVENOUS | Status: AC
  Administered 2023-06-28: 10 meq via INTRAVENOUS
  Filled 2023-06-27: qty 100

## 2023-06-27 NOTE — ED Provider Notes (Signed)
 [facility] Provider Note    Event Date/Time   First MD Initiated Contact with Patient 06/27/23 2324     (approximate)   History   Loss of Consciousness   HPI  Tiffany Richardson is a 78 y.o. female brought to the ED via EMS from restaurant with a chief complaint of syncope.  Patient was at dinner, felt hot, stood up to take off her jacket, stated to her family that she felt unwell and had a syncopal episode.  She syncopized a second time when bystanders helped to get her up.  EMS reports + orthostasis.  Denies anticoagulant use.  Endorses generalized weakness.  Denies vision changes, headache, neck pain, chest pain, shortness of breath, abdominal pain, nausea or vomiting.     Past Medical History   Past Medical History:  Diagnosis Date  . Allergy   . Cancer (HCC)    basal cell  . Emphysema of lung (HCC)   . Hypertension      Active Problem List   Patient Active Problem List   Diagnosis Date Noted  . Aortic atherosclerosis (HCC) 05/28/2022  . Essential thrombocythemia (HCC) 09/11/2020  . Mixed hyperlipidemia 10/31/2017  . Hypertension 12/11/2015  . IBS (irritable bowel syndrome) 12/11/2015  . Gastroesophageal reflux disease without esophagitis 04/28/2015     Past Surgical History   Past Surgical History:  Procedure Laterality Date  . basal cell removed     Rt eye  . BIOPSY  12/19/2022   Procedure: BIOPSY;  Surgeon: Jaynie Collins, DO;  Location: [facility] ENDOSCOPY;  Service: Gastroenterology;;  . COLONOSCOPY    . COLONOSCOPY WITH PROPOFOL N/A 12/19/2022   Procedure: COLONOSCOPY WITH PROPOFOL;  Surgeon: Jaynie Collins, DO;  Location: [facility] ENDOSCOPY;  Service: Gastroenterology;  Laterality: N/A;  . ESOPHAGOGASTRODUODENOSCOPY (EGD) WITH PROPOFOL N/A 12/19/2022   Procedure: ESOPHAGOGASTRODUODENOSCOPY (EGD) WITH PROPOFOL;  Surgeon: Jaynie Collins, DO;  Location: [facility] ENDOSCOPY;  Service: Gastroenterology;  Laterality: N/A;  .  POLYPECTOMY  12/19/2022   Procedure: POLYPECTOMY;  Surgeon: Jaynie Collins, DO;  Location: [facility] ENDOSCOPY;  Service: Gastroenterology;;  . squamous cell removed        Home Medications   Prior to Admission medications   Medication Sig Start Date End Date Taking? Authorizing Provider  aspirin EC 81 MG tablet Take 81 mg by mouth.    [provider]  atorvastatin (LIPITOR) 40 MG tablet TAKE 1 TABLET BY MOUTH EVERY DAY 04/11/23   Lorre Munroe, NP  dicyclomine (BENTYL) 10 MG capsule Take 1 capsule (10 mg total) by mouth 4 (four) times daily -  before meals and at bedtime. 02/14/23   Lorre Munroe, NP  doxycycline (VIBRA-TABS) 100 MG tablet Take 1 tablet (100 mg total) by mouth 2 (two) times daily. 05/22/23   Lorre Munroe, NP  fluticasone (FLONASE) 50 MCG/ACT nasal spray SPRAY 2 SPRAYS INTO EACH NOSTRIL EVERY DAY 06/17/23   Lorre Munroe, NP  hydroxyurea (HYDREA) 500 MG capsule Take 1 capsule (500 mg total) by mouth See admin instructions. Take 1000mg  on Wednesdays and Sundays, and take 500mg  daily for the rest of the week. 05/29/23   Rickard Patience, MD  losartan (COZAAR) 25 MG tablet Take 1 tablet (25 mg total) by mouth daily. 02/05/23   Lorre Munroe, NP  metoprolol succinate (TOPROL-XL) 50 MG 24 hr tablet TAKE 1 TABLET BY MOUTH EVERY DAY WITH OR IMMEDIATELY FOLLOWING A MEAL 04/11/23   Lorre Munroe, NP  omeprazole (  PRILOSEC) 40 MG capsule TAKE 1 CAPSULE (40 MG TOTAL) BY MOUTH IN THE MORNING AND AT BEDTIME.    [provider]  sucralfate (CARAFATE) 1 g tablet Take by mouth. 08/06/22   [provider]     Allergies  Codeine   Family History   Family History  Problem Relation Age of Onset  . Leukemia Mother   . Heart disease Father   . Non-Hodgkin's lymphoma Daughter      Physical Exam  Triage Vital Signs: ED Triage Vitals  Encounter Vitals Group     BP 06/27/23 2241 134/75     Systolic BP Percentile --      Diastolic BP Percentile --       Pulse Rate 06/27/23 2241 63     Resp 06/27/23 2241 16     Temp 06/27/23 2241 98 F (36.7 C)     Temp Source 06/27/23 2241 Oral     SpO2 06/27/23 2241 98 %     Weight --      Height 06/27/23 2236 5\' 4"  (1.626 m)     Head Circumference --      Peak Flow --      Pain Score 06/27/23 2236 0     Pain Loc --      Pain Education --      Exclude from Growth Chart --     Updated Vital Signs: BP 133/71   Pulse 83   Temp 98 F (36.7 C) (Oral)   Resp 18   Ht 5\' 4"  (1.626 m)   SpO2 100%   BMI 24.03 kg/m    General: Awake, mild distress.  CV:  RRR.  Good peripheral perfusion.  Resp:  Normal effort.  CTAB. Abd:  Nontender.  No distention.  Other:  Head is atraumatic.  PERRL.  EOMI.  Nose is atraumatic.  No dental malocclusion.  No midline cervical spine tenderness to palpation, step-offs or deformities noted.  No carotid bruits.  Alert and oriented x 3.  CN II-XII grossly intact.  5/5 motor strength and sensation.   ED Results / Procedures / Treatments  Labs (all labs ordered are listed, but only abnormal results are displayed) Labs Reviewed  BASIC METABOLIC PANEL - Abnormal; Notable for the following components:      Result Value   Potassium 2.9 (*)    Glucose, Bld 164 (*)    Calcium 8.7 (*)    All other components within normal limits  CBC - Abnormal; Notable for the following components:   RBC 3.22 (*)    MCV 114.9 (*)    MCH 40.4 (*)    Platelets 514 (*)    All other components within normal limits  URINALYSIS, ROUTINE W REFLEX MICROSCOPIC - Abnormal; Notable for the following components:   Color, Urine YELLOW (*)    APPearance HAZY (*)    Hgb urine dipstick SMALL (*)    Protein, ur 30 (*)    Leukocytes,Ua TRACE (*)    Bacteria, UA RARE (*)    All other components within normal limits  RESP PANEL BY RT-PCR (RSV, FLU A&B, COVID)  [id number]  URINE CULTURE  CBG MONITORING, ED  TROPONIN I (HIGH SENSITIVITY)  TROPONIN I (HIGH SENSITIVITY)     EKG  ED ECG REPORT I,  Darlyne Schmiesing J, the attending physician, personally viewed and interpreted this ECG.   Date: 06/28/2023  EKG Time: 2245  Rate: 64  Rhythm: normal sinus rhythm  Axis: Normal  Intervals:none  ST&T Change:  Nonspecific    RADIOLOGY I have independently visualized and interpreted patient's imaging studies as well as noted the radiology interpretation:  CT head: No ICH  CT cervical spine: No acute osseous injury  Chest x-ray: No acute cardiopulmonary process  Official radiology report(s): DG Chest Port 1 View Result Date: 06/28/2023 CLINICAL DATA:  Syncope EXAM: PORTABLE CHEST 1 VIEW COMPARISON:  09/06/2007 FINDINGS: The heart size and mediastinal contours are within normal limits. Both lungs are clear. The visualized skeletal structures are unremarkable. IMPRESSION: No active disease. Electronically Signed   By: Jasmine Pang M.D.   On: 06/28/2023 00:43   CT Cervical Spine Wo Contrast Result Date: 06/28/2023 CLINICAL DATA:  Fall syncope EXAM: CT CERVICAL SPINE WITHOUT CONTRAST TECHNIQUE: Multidetector CT imaging of the cervical spine was performed without intravenous contrast. Multiplanar CT image reconstructions were also generated. RADIATION DOSE REDUCTION: This exam was performed according to the departmental dose-optimization program which includes automated exposure control, adjustment of the mA and/or kV according to patient size and/or use of iterative reconstruction technique. COMPARISON:  None Available. FINDINGS: Alignment: Normal. Skull base and vertebrae: No acute fracture. Multiple small lucent lesions within the vertebral bodies. Soft tissues and spinal canal: No prevertebral fluid or swelling. No visible canal hematoma. Disc levels:  Mild multilevel degenerative changes. Upper chest: Apical scarring Other: None IMPRESSION: 1. No CT evidence for acute osseous abnormality. 2. Multiple small lucent lesions within the vertebral bodies, correlate for any history of marrow disease such as  myeloma Electronically Signed   By: Jasmine Pang M.D.   On: 06/28/2023 00:43   CT HEAD WO CONTRAST Result Date: 06/28/2023 CLINICAL DATA:  Facial trauma syncope hit head EXAM: CT HEAD WITHOUT CONTRAST TECHNIQUE: Contiguous axial images were obtained from the base of the skull through the vertex without intravenous contrast. RADIATION DOSE REDUCTION: This exam was performed according to the departmental dose-optimization program which includes automated exposure control, adjustment of the mA and/or kV according to patient size and/or use of iterative reconstruction technique. COMPARISON:  CT brain 09/07/2007 FINDINGS: Brain: No acute territorial infarction, hemorrhage or intracranial mass. The ventricles are nonenlarged Vascular: No hyperdense vessel or unexpected calcification. Skull: Normal. Negative for fracture or focal lesion. Sinuses/Orbits: No acute finding. Other: None IMPRESSION: No CT evidence for acute intracranial abnormality Electronically Signed   By: Jasmine Pang M.D.   On: 06/28/2023 00:39     PROCEDURES:  Critical Care performed: No  .1-3 Lead EKG Interpretation  Performed by: Irean Hong, MD Authorized by: Irean Hong, MD     Interpretation: normal     ECG rate:  65   ECG rate assessment: normal     Rhythm: sinus rhythm     Ectopy: none     Conduction: normal   Comments:     Patient placed on cardiac monitor to evaluate for arrhythmias    MEDICATIONS ORDERED IN ED: Medications  cefTRIAXone (ROCEPHIN) 1 g in sodium chloride 0.9 % 100 mL IVPB (has no administration in time range)  sodium chloride 0.9 % bolus 1,000 mL (1,000 mLs Intravenous New Bag/Given 06/27/23 2338)  potassium chloride 10 mEq in 100 mL IVPB (10 mEq Intravenous New Bag/Given 06/28/23 0005)     IMPRESSION / MDM / ASSESSMENT AND PLAN / ED COURSE  I reviewed the triage vital signs and the nursing notes.  78 year old female presenting with syncope x 2.  Differential  diagnosis includes but is not limited to CVA, ICH, ACS, infectious, metabolic etiologies, etc.  I personally reviewed patient's records and note oncology office visit from 05/29/2023 for thrombocytopenia and elevated alkaline phosphatase level.  Patient's presentation is most consistent with acute presentation with potential threat to life or bodily function.  The patient is on the cardiac monitor to evaluate for evidence of arrhythmia and/or significant heart rate changes.  Laboratory results demonstrate normal WBC 8.7, stable hemoglobin of 13, mild hypokalemia with potassium 2.9.  Initial troponin negative.  Patient has been for CT scan and chest x-ray, delay secondary to radiology.  Awaiting results of respiratory panel, obtain UA.  Initiate IV fluid resuscitation, replete potassium via IV.  Anticipate hospitalization.  Clinical Course as of 06/28/23 0121  Sat Jun 28, 2023  0120 Imaging studies negative.  IV Rocephin administered for mild UTI.  Will consult hospital services for evaluation and admission. [JS]    Clinical Course User Index [JS] Irean Hong, MD     FINAL CLINICAL IMPRESSION(S) / ED DIAGNOSES   Final diagnoses:  Syncope, unspecified syncope type  Hypokalemia  Orthostasis  Lower urinary tract infectious disease     Rx / DC Orders   ED Discharge Orders     None        Note:  This document was prepared using Dragon voice recognition software and may include unintentional dictation errors.   Irean Hong, MD 06/28/23 [id number]

## 2023-06-27 NOTE — ED Notes (Addendum)
 Pt to ed from [facility] via [facility] for syncope. Pt hit her head. No Thinners.  + Orthostatic for EMS 159/80 4mg  Zofran for some vomiting.  20G LAC 12 lead WNL CAOx4. Pt advised "I feel fine. I did not pass out"

## 2023-06-27 NOTE — ED Notes (Signed)
 CCMD aware of need for monitoring

## 2023-06-27 NOTE — Telephone Encounter (Signed)
 Requested too soon, Last ordered 02/14/23 #270, 1 refill Requested Prescriptions  Pending Prescriptions Disp Refills   dicyclomine (BENTYL) 10 MG capsule [Pharmacy Med Name: DICYCLOMINE 10 MG CAPSULE] 270 capsule 1    Sig: TAKE 1 CAPSULE (10 MG TOTAL) BY MOUTH 4 TIMES A DAY BEFORE MEALS AND AT BEDTIME     Gastroenterology:  Antispasmodic Agents Passed - 06/27/2023 12:38 PM      Passed - Valid encounter within last 12 months    Recent Outpatient Visits           1 month ago Acute non-recurrent frontal sinusitis   [place] [facility] [place], Salvadore Oxford, NP   4 months ago Primary hypertension   [place] [facility] [place], Salvadore Oxford, NP   4 months ago Aortic atherosclerosis [facility])   [place] [facility] [place], Salvadore Oxford, NP   1 year ago Mixed hyperlipidemia   [place] [facility] [place], Salvadore Oxford, NP   1 year ago Acute bacterial sinusitis   [place] [facility] [place], Salvadore Oxford, NP       Future Appointments             In 1 month [place], Salvadore Oxford, NP [place] [facility], [facility]

## 2023-06-28 DIAGNOSIS — E876 Hypokalemia: Secondary | ICD-10-CM | POA: Diagnosis not present

## 2023-06-28 DIAGNOSIS — E785 Hyperlipidemia, unspecified: Secondary | ICD-10-CM

## 2023-06-28 DIAGNOSIS — R55 Syncope and collapse: Principal | ICD-10-CM

## 2023-06-28 DIAGNOSIS — I10 Essential (primary) hypertension: Secondary | ICD-10-CM

## 2023-06-28 DIAGNOSIS — N390 Urinary tract infection, site not specified: Secondary | ICD-10-CM | POA: Diagnosis not present

## 2023-06-28 LAB — URINALYSIS, ROUTINE W REFLEX MICROSCOPIC
Bilirubin Urine: NEGATIVE
Glucose, UA: NEGATIVE mg/dL
Ketones, ur: NEGATIVE mg/dL
Nitrite: NEGATIVE
Protein, ur: 30 mg/dL — AB
Specific Gravity, Urine: 1.025 (ref 1.005–1.030)
pH: 5 (ref 5.0–8.0)

## 2023-06-28 LAB — RESP PANEL BY RT-PCR (RSV, FLU A&B, COVID)  RVPGX2
Influenza A by PCR: NEGATIVE
Influenza B by PCR: NEGATIVE
Resp Syncytial Virus by PCR: NEGATIVE
SARS Coronavirus 2 by RT PCR: NEGATIVE

## 2023-06-28 LAB — CBC
HCT: 33.9 % — ABNORMAL LOW (ref 36.0–46.0)
Hemoglobin: 11.9 g/dL — ABNORMAL LOW (ref 12.0–15.0)
MCH: 39 pg — ABNORMAL HIGH (ref 26.0–34.0)
MCHC: 35.1 g/dL (ref 30.0–36.0)
MCV: 111.1 fL — ABNORMAL HIGH (ref 80.0–100.0)
Platelets: 439 10*3/uL — ABNORMAL HIGH (ref 150–400)
RBC: 3.05 MIL/uL — ABNORMAL LOW (ref 3.87–5.11)
RDW: 14.6 % (ref 11.5–15.5)
WBC: 8.7 10*3/uL (ref 4.0–10.5)
nRBC: 0 % (ref 0.0–0.2)

## 2023-06-28 LAB — BASIC METABOLIC PANEL
Anion gap: 9 (ref 5–15)
BUN: 13 mg/dL (ref 8–23)
CO2: 24 mmol/L (ref 22–32)
Calcium: 8.3 mg/dL — ABNORMAL LOW (ref 8.9–10.3)
Chloride: 107 mmol/L (ref 98–111)
Creatinine, Ser: 0.73 mg/dL (ref 0.44–1.00)
GFR, Estimated: >60 mL/min (ref >60)
Glucose, Bld: 123 mg/dL — ABNORMAL HIGH (ref 70–99)
Potassium: 4.1 mmol/L (ref 3.5–5.1)
Sodium: 140 mmol/L (ref 135–145)

## 2023-06-28 LAB — TROPONIN I (HIGH SENSITIVITY): Troponin I (High Sensitivity): 4 ng/L (ref <18)

## 2023-06-28 MED ORDER — HYDROXYUREA 500 MG PO CAPS
1000.0000 mg | ORAL_CAPSULE | ORAL | Status: DC
  Administered 2023-06-29: 1000 mg via ORAL
  Filled 2023-06-28: qty 2

## 2023-06-28 MED ORDER — HYDROXYUREA 500 MG PO CAPS
500.0000 mg | ORAL_CAPSULE | ORAL | Status: DC
  Administered 2023-06-28 – 2023-06-30 (×2): 500 mg via ORAL
  Filled 2023-06-28 (×2): qty 1

## 2023-06-28 MED ORDER — POTASSIUM CHLORIDE IN NACL 20-0.9 MEQ/L-% IV SOLN
INTRAVENOUS | Status: DC
Start: 2023-06-28 — End: 2023-06-29
  Filled 2023-06-28 (×3): qty 1000

## 2023-06-28 MED ORDER — TRAZODONE HCL 50 MG PO TABS
25.0000 mg | ORAL_TABLET | Freq: Every evening | ORAL | Status: DC | PRN
  Filled 2023-06-28: qty 1

## 2023-06-28 MED ORDER — HYDROXYUREA 500 MG PO CAPS: 500.0000 mg | ORAL_CAPSULE | ORAL | Status: DC

## 2023-06-28 MED ORDER — LOSARTAN POTASSIUM 25 MG PO TABS
25.0000 mg | ORAL_TABLET | Freq: Every day | ORAL | Status: DC
  Administered 2023-06-28 – 2023-06-30 (×3): 25 mg via ORAL
  Filled 2023-06-28 (×3): qty 1

## 2023-06-28 MED ORDER — ASPIRIN 81 MG PO TBEC
81.0000 mg | DELAYED_RELEASE_TABLET | Freq: Every day | ORAL | Status: DC
  Administered 2023-06-28 – 2023-06-30 (×3): 81 mg via ORAL
  Filled 2023-06-28 (×3): qty 1

## 2023-06-28 MED ORDER — ONDANSETRON HCL 4 MG/2ML IJ SOLN: 4.0000 mg | Freq: Four times a day (QID) | INTRAMUSCULAR | Status: DC | PRN

## 2023-06-28 MED ORDER — ACETAMINOPHEN 325 MG PO TABS
650.0000 mg | ORAL_TABLET | Freq: Four times a day (QID) | ORAL | Status: DC | PRN
  Filled 2023-06-28: qty 2

## 2023-06-28 MED ORDER — MAGNESIUM HYDROXIDE 400 MG/5ML PO SUSP: 30.0000 mL | Freq: Every day | ORAL | Status: DC | PRN

## 2023-06-28 MED ORDER — ATORVASTATIN CALCIUM 20 MG PO TABS
40.0000 mg | ORAL_TABLET | Freq: Every day | ORAL | Status: DC
  Administered 2023-06-28 – 2023-06-30 (×3): 40 mg via ORAL
  Filled 2023-06-28 (×3): qty 2

## 2023-06-28 MED ORDER — ENOXAPARIN SODIUM 40 MG/0.4ML IJ SOSY
40.0000 mg | PREFILLED_SYRINGE | INTRAMUSCULAR | Status: DC
  Administered 2023-06-28 – 2023-06-30 (×3): 40 mg via SUBCUTANEOUS
  Filled 2023-06-28 (×3): qty 0.4

## 2023-06-28 MED ORDER — METOPROLOL SUCCINATE ER 50 MG PO TB24
50.0000 mg | ORAL_TABLET | Freq: Every day | ORAL | Status: DC
  Administered 2023-06-28 – 2023-06-30 (×3): 50 mg via ORAL
  Filled 2023-06-28 (×3): qty 1

## 2023-06-28 MED ORDER — SODIUM CHLORIDE 0.9 % IV SOLN
1.0000 g | INTRAVENOUS | Status: DC
  Administered 2023-06-28 – 2023-06-29 (×2): 1 g via INTRAVENOUS
  Filled 2023-06-28 (×2): qty 10

## 2023-06-28 MED ORDER — HYOSCYAMINE SULFATE 0.125 MG PO TABS: 0.1250 mg | ORAL_TABLET | ORAL | Status: DC | PRN

## 2023-06-28 MED ORDER — PANTOPRAZOLE SODIUM 40 MG PO TBEC
40.0000 mg | DELAYED_RELEASE_TABLET | Freq: Every day | ORAL | Status: DC
  Administered 2023-06-28 – 2023-06-30 (×3): 40 mg via ORAL
  Filled 2023-06-28 (×3): qty 1

## 2023-06-28 MED ORDER — SODIUM CHLORIDE 0.9 % IV SOLN
1.0000 g | Freq: Once | INTRAVENOUS | Status: AC
  Administered 2023-06-28: 1 g via INTRAVENOUS
  Filled 2023-06-28: qty 10

## 2023-06-28 MED ORDER — ONDANSETRON HCL 4 MG PO TABS: 4.0000 mg | ORAL_TABLET | Freq: Four times a day (QID) | ORAL | Status: DC | PRN

## 2023-06-28 MED ORDER — ACETAMINOPHEN 650 MG RE SUPP: 650.0000 mg | Freq: Four times a day (QID) | RECTAL | Status: DC | PRN

## 2023-06-28 NOTE — Progress Notes (Signed)
 Waiting on IV pump to come from [facility] to be able to start IV fluids.

## 2023-06-28 NOTE — Assessment & Plan Note (Signed)
-   We will continue IV Rocephin pending confirmation with urine culture and sensitivity.

## 2023-06-28 NOTE — Assessment & Plan Note (Signed)
 -  We will continue statin therapy.

## 2023-06-28 NOTE — H&P (Addendum)
 [place]   PATIENT NAME: Tiffany Richardson    MR#:  [id number]  DATE OF BIRTH:  [birth year]  DATE OF ADMISSION:  06/27/2023  PRIMARY CARE PHYSICIAN: Lorre Munroe, NP   Patient is coming from: Home  REQUESTING/REFERRING PHYSICIAN: Chiquita Loth, MD  CHIEF COMPLAINT:   Chief Complaint  Patient presents with   Loss of Consciousness    HISTORY OF PRESENT ILLNESS:  Tiffany Richardson is a 78 y.o. female with medical history significant for emphysema and hypertension, who presented to the emergency room with acute onset of syncope twice.  The patient was at a restaurant when she felt woozy and "too hot" before she passed out.  She then got to a chair when she passed out again.  She denies any chest pain or palpitations.  No paresthesias or focal muscle weakness.  No dysphagia or dysarthria.  No fever or chills.  No cough or wheezing or hemoptysis.  She denies any significant urinary frequency or urgency or dysuria or flank pain.  She has been having diminished appetite.  ED Course: When she came to the ER, vital signs were within normal.  Labs revealed hypokalemia of 2.9 and a blood glucose 164.  CBC showed macrocytosis.  Respiratory panel came back negative.  UA was suspicious for UTI with 6-10 WBCs rare Keriann trace leukocytes.  Urine culture was sent. EKG as reviewed by me :  EKG showed normal sinus rhythm with a rate of 64 with minimal voltage criteria for LVH. Imaging: Noncontrast head CT scan revealed no acute intracranial abnormalities.  Abdominal and pelvic CT scan revealed multiple small lucent lesions within the vertebral bodies with recommendation for correlation for any history of marrow disease such as myeloma.  It showed no acute findings otherwise.  The patient was given 1 L bolus of IV normal saline, 1 g of IV Rocephin and 10 mill equivalent IV potassium chloride.  She will be admitted to a medical telemetry observation bed for further evaluation and management. PAST MEDICAL  HISTORY:   Past Medical History:  Diagnosis Date   Allergy    Cancer (HCC)    basal cell   Emphysema of lung (HCC)    Hypertension     PAST SURGICAL HISTORY:   Past Surgical History:  Procedure Laterality Date   basal cell removed     Rt eye   BIOPSY  12/19/2022   Procedure: BIOPSY;  Surgeon: Jaynie Collins, DO;  Location: [facility] ENDOSCOPY;  Service: Gastroenterology;;   COLONOSCOPY     COLONOSCOPY WITH PROPOFOL N/A 12/19/2022   Procedure: COLONOSCOPY WITH PROPOFOL;  Surgeon: Jaynie Collins, DO;  Location: [facility] ENDOSCOPY;  Service: Gastroenterology;  Laterality: N/A;   ESOPHAGOGASTRODUODENOSCOPY (EGD) WITH PROPOFOL N/A 12/19/2022   Procedure: ESOPHAGOGASTRODUODENOSCOPY (EGD) WITH PROPOFOL;  Surgeon: Jaynie Collins, DO;  Location: [facility] ENDOSCOPY;  Service: Gastroenterology;  Laterality: N/A;   POLYPECTOMY  12/19/2022   Procedure: POLYPECTOMY;  Surgeon: Jaynie Collins, DO;  Location: [facility] ENDOSCOPY;  Service: Gastroenterology;;   squamous cell removed       SOCIAL HISTORY:   Social History   Tobacco Use   Smoking status: Never   Smokeless tobacco: Never  Substance Use Topics   Alcohol use: No    Alcohol/week: 0.0 standard drinks of alcohol    FAMILY HISTORY:   Family History  Problem Relation Age of Onset   Leukemia Mother    Heart disease Father    Non-Hodgkin's lymphoma Daughter  DRUG ALLERGIES:   Allergies  Allergen Reactions   Codeine Swelling    REVIEW OF SYSTEMS:   ROS As per history of present illness. All pertinent systems were reviewed above. Constitutional, HEENT, cardiovascular, respiratory, GI, GU, musculoskeletal, neuro, psychiatric, endocrine, integumentary and hematologic systems were reviewed and are otherwise negative/unremarkable except for positive findings mentioned above in the HPI.   MEDICATIONS AT HOME:   Prior to Admission medications   Medication Sig Start Date End Date Taking? Authorizing Provider   aspirin EC 81 MG tablet Take 81 mg by mouth.   Yes [provider]  atorvastatin (LIPITOR) 40 MG tablet TAKE 1 TABLET BY MOUTH EVERY DAY 04/11/23  Yes Baity, Salvadore Oxford, NP  fluticasone [facility]) 50 MCG/ACT nasal spray SPRAY 2 SPRAYS INTO EACH NOSTRIL EVERY DAY 06/17/23  Yes Baity, Salvadore Oxford, NP  hydroxyurea (HYDREA) 500 MG capsule Take 1 capsule (500 mg total) by mouth See admin instructions. Take 1000mg  on Wednesdays and Sundays, and take 500mg  daily for the rest of the week. 05/29/23  Yes Rickard Patience, MD  hyoscyamine (LEVSIN) 0.125 MG tablet Take 0.125 mg by mouth every 4 (four) hours as needed.   Yes [provider]  losartan (COZAAR) 25 MG tablet Take 1 tablet (25 mg total) by mouth daily. 02/05/23  Yes Baity, Salvadore Oxford, NP  metoprolol succinate (TOPROL-XL) 50 MG 24 hr tablet TAKE 1 TABLET BY MOUTH EVERY DAY WITH OR IMMEDIATELY FOLLOWING A MEAL 04/11/23  Yes Baity, Salvadore Oxford, NP  omeprazole (PRILOSEC) 40 MG capsule TAKE 1 CAPSULE (40 MG TOTAL) BY MOUTH IN THE MORNING AND AT BEDTIME.   Yes [provider]  pantoprazole (PROTONIX) 40 MG tablet Take 1 tablet by mouth daily.   Yes [provider]  sucralfate (CARAFATE) 1 g tablet Take by mouth. 08/06/22  Yes [provider]  dicyclomine (BENTYL) 10 MG capsule Take 1 capsule (10 mg total) by mouth 4 (four) times daily -  before meals and at bedtime. Patient not taking: Reported on 06/28/2023 02/14/23   Lorre Munroe, NP  doxycycline (VIBRA-TABS) 100 MG tablet Take 1 tablet (100 mg total) by mouth 2 (two) times daily. Patient not taking: Reported on 06/28/2023 05/22/23   Lorre Munroe, NP      VITAL SIGNS:  Blood pressure (!) 142/87, pulse 97, temperature 97.6 F (36.4 C), temperature source Oral, resp. rate 16, height 5\' 4"  (1.626 m), SpO2 95%.  PHYSICAL EXAMINATION:  Physical Exam  GENERAL:  78 y.o.-year-old patient lying in the bed with no acute distress.  EYES: Pupils equal, round, reactive to light and  accommodation. No scleral icterus. Extraocular muscles intact.  HEENT: Head atraumatic, normocephalic. Oropharynx and nasopharynx clear.  NECK:  Supple, no jugular venous distention. No thyroid enlargement, no tenderness.  LUNGS: Normal breath sounds bilaterally, no wheezing, rales,rhonchi or crepitation. No use of accessory muscles of respiration.  CARDIOVASCULAR: Regular rate and rhythm, S1, S2 normal. No murmurs, rubs, or gallops.  ABDOMEN: Soft, nondistended, nontender. Bowel sounds present. No organomegaly or mass.  EXTREMITIES: No pedal edema, cyanosis, or clubbing.  NEUROLOGIC: Cranial nerves II through XII are intact. Muscle strength 5/5 in all extremities. Sensation intact. Gait not checked.  PSYCHIATRIC: The patient is alert and oriented x 3.  Normal affect and good eye contact. SKIN: No obvious rash, lesion, or ulcer.   LABORATORY PANEL:   CBC Recent Labs  Lab 06/28/23 0511  WBC 8.7  HGB 11.9*  HCT 33.9*  PLT 439*   ------------------------------------------------------------------------------------------------------------------  Chemistries  Recent Labs  Lab 06/28/23 0511  NA 140  K 4.1  CL 107  CO2 24  GLUCOSE 123*  BUN 13  CREATININE 0.73  CALCIUM 8.3*   ------------------------------------------------------------------------------------------------------------------  Cardiac Enzymes No results for input(s): "TROPONINI" in the last 168 hours. ------------------------------------------------------------------------------------------------------------------  RADIOLOGY:  DG Chest Port 1 View Result Date: 06/28/2023 CLINICAL DATA:  Syncope EXAM: PORTABLE CHEST 1 VIEW COMPARISON:  09/06/2007 FINDINGS: The heart size and mediastinal contours are within normal limits. Both lungs are clear. The visualized skeletal structures are unremarkable. IMPRESSION: No active disease. Electronically Signed   By: Jasmine Pang M.D.   On: 06/28/2023 00:43   CT Cervical Spine Wo  Contrast Result Date: 06/28/2023 CLINICAL DATA:  Fall syncope EXAM: CT CERVICAL SPINE WITHOUT CONTRAST TECHNIQUE: Multidetector CT imaging of the cervical spine was performed without intravenous contrast. Multiplanar CT image reconstructions were also generated. RADIATION DOSE REDUCTION: This exam was performed according to the departmental dose-optimization program which includes automated exposure control, adjustment of the mA and/or kV according to patient size and/or use of iterative reconstruction technique. COMPARISON:  None Available. FINDINGS: Alignment: Normal. Skull base and vertebrae: No acute fracture. Multiple small lucent lesions within the vertebral bodies. Soft tissues and spinal canal: No prevertebral fluid or swelling. No visible canal hematoma. Disc levels:  Mild multilevel degenerative changes. Upper chest: Apical scarring Other: None IMPRESSION: 1. No CT evidence for acute osseous abnormality. 2. Multiple small lucent lesions within the vertebral bodies, correlate for any history of marrow disease such as myeloma Electronically Signed   By: Jasmine Pang M.D.   On: 06/28/2023 00:43   CT HEAD WO CONTRAST Result Date: 06/28/2023 CLINICAL DATA:  Facial trauma syncope hit head EXAM: CT HEAD WITHOUT CONTRAST TECHNIQUE: Contiguous axial images were obtained from the base of the skull through the vertex without intravenous contrast. RADIATION DOSE REDUCTION: This exam was performed according to the departmental dose-optimization program which includes automated exposure control, adjustment of the mA and/or kV according to patient size and/or use of iterative reconstruction technique. COMPARISON:  CT brain 09/07/2007 FINDINGS: Brain: No acute territorial infarction, hemorrhage or intracranial mass. The ventricles are nonenlarged Vascular: No hyperdense vessel or unexpected calcification. Skull: Normal. Negative for fracture or focal lesion. Sinuses/Orbits: No acute finding. Other: None IMPRESSION: No  CT evidence for acute intracranial abnormality Electronically Signed   By: Jasmine Pang M.D.   On: 06/28/2023 00:39      IMPRESSION AND PLAN:  Assessment and Plan: * Syncope - The patient will be admitted to an observation medically monitored bed. - Will check orthostatics q 12 hours. - Will obtain a bilateral carotid Doppler and 2D echo. - The patient will be gently hydrated with IV normal saline and monitored for arrhythmias. -Differential diagnoses would include neurally mediated syncope, cardiogenic, arrhythmias related,  orthostatic hypotension and less likely hypoglycemia.    Hypokalemia - Potassium will be replaced and magnesium will be checked.  Acute lower UTI - We will continue IV Rocephin pending confirmation with urine culture and sensitivity.  Dyslipidemia - We will continue statin therapy.  Essential hypertension - Continue antihypertensive therapy.  GERD without esophagitis - We will continue PPI therapy and Carafate.       DVT prophylaxis: Lovenox.  Advanced Care Planning:  Code Status: full code.  Family Communication:  The plan of care was discussed in details with the patient (and family). I answered all questions. The patient agreed to proceed with the above mentioned plan. Further management will  depend upon hospital course. Disposition Plan: Back to previous home environment Consults called: none.  All the records are reviewed and case discussed with ED provider.  Status is: Observation  I certify that at the time of admission, it is my clinical judgment that the patient will require hospital care extending less than 2 midnights.                            Dispo: The patient is from: Home              Anticipated d/c is to: Home              Patient currently is not medically stable to d/c.              Difficult to place patient: No  Hannah Beat M.D on 06/28/2023 at 5:45 AM  [facility]   From 7 PM-7 AM, contact  night-coverage [email or web address]  CC: Primary care physician; Lorre Munroe, NP

## 2023-06-28 NOTE — Assessment & Plan Note (Signed)
-  The patient will be admitted to an observation medically monitored bed. - Will check orthostatics q 12 hours. - Will obtain a bilateral carotid Doppler and 2D echo. - The patient will be gently hydrated with IV normal saline and monitored for arrhythmias. -Differential diagnoses would include neurally mediated syncope, cardiogenic, arrhythmias related,  orthostatic hypotension and less likely hypoglycemia.

## 2023-06-28 NOTE — Assessment & Plan Note (Signed)
 -  Continue antihypertensive therapy ?

## 2023-06-28 NOTE — Plan of Care (Signed)

## 2023-06-28 NOTE — Assessment & Plan Note (Signed)
-   We will continue PPI therapy and Carafate. 

## 2023-06-28 NOTE — Progress Notes (Signed)
 Subjective: Patient admitted this morning, see detailed H&P by Dr Arville Care  78 y.o. female with medical history significant for emphysema and hypertension, who presented to the emergency room with acute onset of syncope twice.  The patient was at a restaurant when she felt woozy and "too hot" before she passed out.  She then got to a chair when she passed out again.  She denies any chest pain or palpitations.   Vitals:   06/28/23 0827 06/28/23 0828  BP: (!) 144/76 130/78  Pulse: 92 (!) 102  Resp:    Temp:    SpO2:        A/P  Syncope -Likely vasovagal syncope, patient says that she was wearing multiple layers of clothing when she felt hot in the restaurant -Orthostatic vital signs are negative -Continue checking orthostatics every 12 hours -2D echo is pending -Continue monitoring on telemetry   Hypokalemia - Replete   Acute lower UTI - We will continue IV Rocephin pending confirmation with urine culture and sensitivity.   Dyslipidemia - We will continue statin therapy.   Essential hypertension - Continue antihypertensive therapy.   GERD without esophagitis - We will continue PPI therapy and Carafate   Cheyeanne [address] [place] [facility]

## 2023-06-28 NOTE — Assessment & Plan Note (Signed)
-  Potassium will be replaced and magnesium will be checked.

## 2023-06-29 ENCOUNTER — Observation Stay

## 2023-06-29 ENCOUNTER — Observation Stay (HOSPITAL_BASED_OUTPATIENT_CLINIC_OR_DEPARTMENT_OTHER)
Admit: 2023-06-29 | Discharge: 2023-06-29 | Disposition: A | Discharge status: Home / Self Care | Attending: Family Medicine | Admitting: Family Medicine

## 2023-06-29 DIAGNOSIS — R55 Syncope and collapse: Secondary | ICD-10-CM | POA: Diagnosis not present

## 2023-06-29 DIAGNOSIS — E876 Hypokalemia: Secondary | ICD-10-CM | POA: Diagnosis not present

## 2023-06-29 DIAGNOSIS — K7689 Other specified diseases of liver: Secondary | ICD-10-CM | POA: Diagnosis not present

## 2023-06-29 DIAGNOSIS — I10 Essential (primary) hypertension: Secondary | ICD-10-CM | POA: Diagnosis not present

## 2023-06-29 DIAGNOSIS — K8020 Calculus of gallbladder without cholecystitis without obstruction: Secondary | ICD-10-CM | POA: Diagnosis not present

## 2023-06-29 LAB — ECHOCARDIOGRAM COMPLETE
AR max vel: 1.88 cm2
AV Peak grad: 5.1 mmHg
Ao pk vel: 1.13 m/s
Area-P 1/2: 6.27 cm2
Height: 64 in
P 1/2 time: 382 ms
S' Lateral: 2.6 cm
Weight: 2239.87 [oz_av]

## 2023-06-29 NOTE — Progress Notes (Signed)
  Progress Note   Patient: Tiffany Richardson ZOX:[id number] DOB: 02-08-1946 DOA: 06/27/2023     0 DOS: the patient was seen and examined on 06/29/2023   Brief hospital course: SHEMECA LUKASIK is a 78 y.o. female with medical history significant for emphysema and hypertension, who presented to the emergency room with acute onset of syncope twice  Patient normally was healthy, but started having nausea vomiting on Friday morning, has not been able to eat for 9 hours before came to the hospital.  Had a syncope episode at a restaurant.   Principal Problem:   Syncope Active Problems:   Hypokalemia   Acute lower UTI   GERD without esophagitis   Essential hypertension   Dyslipidemia   Assessment and Plan: * Syncope and collapse. Nausea vomiting. Patient has not been eating for a whole day, she started having vomiting on Friday.  Syncope most likely is due to vasovagal reaction. She did not have hypoglycemia upon arriving the hospital. She is complaining some right upper quadrant abdominal discomfort, obtained ultrasound to rule out cholecystitis. Echocardiogram was performed, pending results. Telemetry so far has no arrhythmia.   Hypokalemia Likely due to nausea vomiting.  Potassium has normalized.  Possible acute lower UTI Patient is placed on Rocephin while pending urine culture results.  Patient already had a nausea vomiting, no urinary symptoms.  Not certain patient had UTI.  Will wait for urine results.  Dyslipidemia - We will continue statin therapy.  Essential hypertension - Continue antihypertensive therapy.  GERD without esophagitis - We will continue PPI therapy and Carafate.  Reactive thrombocytosis. Improving.     Subjective: Patient doing better today, no additional nausea vomiting.  Right upper quadrant discomfort.  Physical Exam: Vitals:   06/28/23 1704 06/28/23 2033 06/29/23 0413 06/29/23 0907  BP: 139/70 130/74 131/74 (!) 151/82  Pulse: 79 81 82 76  Resp: 16    16  Temp: (!) 97.4 F (36.3 C) 97.8 F (36.6 C) 98.6 F (37 C) 98 F (36.7 C)  TempSrc: Oral   Oral  SpO2: 93% 95% 96% 97%  Weight:      Height:       General exam: Appears calm and comfortable  Respiratory system: Clear to auscultation. Respiratory effort normal. Cardiovascular system: S1 & S2 heard, RRR. No JVD, murmurs, rubs, gallops or clicks. No pedal edema. Gastrointestinal system: Abdomen is nondistended, soft and nontender. No organomegaly or masses felt. Normal bowel sounds heard. Central nervous system: Alert and oriented. No focal neurological deficits. Extremities: Symmetric 5 x 5 power. Skin: No rashes, lesions or ulcers Psychiatry: Judgement and insight appear normal. Mood & affect appropriate.    Data Reviewed:  Reviewed lab results.  Family Communication: None  Disposition: Status is: Observation      Time spent: 35 minutes  Author: Marrion Coy, MD 06/29/2023 11:56 AM  For on call review [email or web address].

## 2023-06-29 NOTE — Plan of Care (Signed)

## 2023-06-29 NOTE — Care Management Obs Status (Signed)
 MEDICARE OBSERVATION STATUS NOTIFICATION   Patient Details  Name: Tiffany Richardson MRN: [id number] Date of Birth: Feb 03, 1946   Medicare Observation Status Notification Given:  Orland Dec, CMA 06/29/2023, 11:32 AM

## 2023-06-29 NOTE — Hospital Course (Signed)
 Tiffany Richardson is a 78 y.o. female with medical history significant for emphysema and hypertension, who presented to the emergency room with acute onset of syncope twice  Patient normally was healthy, but started having nausea vomiting on Friday morning, has not been able to eat for 9 hours before came to the hospital.  Had a syncope episode at a restaurant.

## 2023-06-30 DIAGNOSIS — R55 Syncope and collapse: Secondary | ICD-10-CM | POA: Diagnosis not present

## 2023-06-30 DIAGNOSIS — E876 Hypokalemia: Secondary | ICD-10-CM | POA: Diagnosis not present

## 2023-06-30 DIAGNOSIS — I10 Essential (primary) hypertension: Secondary | ICD-10-CM | POA: Diagnosis not present

## 2023-06-30 DIAGNOSIS — K8020 Calculus of gallbladder without cholecystitis without obstruction: Secondary | ICD-10-CM | POA: Diagnosis not present

## 2023-06-30 LAB — CBC
HCT: 33.4 % — ABNORMAL LOW (ref 36.0–46.0)
Hemoglobin: 11.5 g/dL — ABNORMAL LOW (ref 12.0–15.0)
MCH: 39.4 pg — ABNORMAL HIGH (ref 26.0–34.0)
MCHC: 34.4 g/dL (ref 30.0–36.0)
MCV: 114.4 fL — ABNORMAL HIGH (ref 80.0–100.0)
Platelets: 399 10*3/uL (ref 150–400)
RBC: 2.92 MIL/uL — ABNORMAL LOW (ref 3.87–5.11)
RDW: 14.6 % (ref 11.5–15.5)
WBC: 6.5 10*3/uL (ref 4.0–10.5)
nRBC: 0 % (ref 0.0–0.2)

## 2023-06-30 LAB — URINE CULTURE: Culture: 80000 — AB

## 2023-06-30 LAB — BASIC METABOLIC PANEL
Anion gap: 9 (ref 5–15)
BUN: 11 mg/dL (ref 8–23)
CO2: 27 mmol/L (ref 22–32)
Calcium: 8.7 mg/dL — ABNORMAL LOW (ref 8.9–10.3)
Chloride: 106 mmol/L (ref 98–111)
Creatinine, Ser: 0.83 mg/dL (ref 0.44–1.00)
GFR, Estimated: >60 mL/min (ref >60)
Glucose, Bld: 101 mg/dL — ABNORMAL HIGH (ref 70–99)
Potassium: 4 mmol/L (ref 3.5–5.1)
Sodium: 142 mmol/L (ref 135–145)

## 2023-06-30 LAB — MAGNESIUM: Magnesium: 2 mg/dL (ref 1.7–2.4)

## 2023-06-30 NOTE — TOC CM/SW Note (Signed)
 Transition of Care [facility]) - Inpatient Brief Assessment   Patient Details  Name: Tiffany Richardson MRN: [id number] Date of Birth: [birth year]  Transition of Care [facility]) CM/SW Contact:    Chapman Fitch, RN Phone Number: 06/30/2023, 10:39 AM   Clinical Narrative:  Transition of Care [facility]) Screening Note   Patient Details  Name: Tiffany Richardson Date of Birth: 01-23-1946   Transition of Care [facility]) CM/SW Contact:    Chapman Fitch, RN Phone Number: 06/30/2023, 10:39 AM    Transition of Care Department [facility]) has reviewed patient and no TOC needs have been identified at this time.  If new patient transition needs arise, please place a TOC consult.     Transition of Care Asessment: Insurance and Status: Insurance coverage has been reviewed Patient has primary care physician: Yes       Social Drivers of Health Review: SDOH reviewed no interventions necessary Readmission risk has been reviewed: Yes Transition of care needs: no transition of care needs at this time

## 2023-06-30 NOTE — Discharge Summary (Signed)
 Physician Discharge Summary   Patient: Tiffany Richardson MRN: [id number] DOB: Sep 02, 1945  Admit date:     06/27/2023  Discharge date: 06/30/23  Discharge Physician: Marrion Coy   PCP: Lorre Munroe, NP   Recommendations at discharge:   Follow-up with PCP in 1 week. Consider ZIO recorder for 1 week. Please referred to general surgery for cholelithiasis.  Discharge Diagnoses: Principal Problem:   Syncope Active Problems:   Hypokalemia   GERD without esophagitis   Essential hypertension   Dyslipidemia   Cholelithiases  Resolved Problems:   * No resolved hospital problems. *  Hospital Course: Tiffany Richardson is a 78 y.o. female with medical history significant for emphysema and hypertension, who presented to the emergency room with acute onset of syncope twice  Patient normally was healthy, but started having nausea vomiting on Friday morning, has not been able to eat for 9 hours before came to the hospital.  Had a syncope episode at a restaurant.  Assessment and Plan:  Syncope and collapse secondary to vasovagal reaction. Nausea vomiting could be secondary to cholelithiasis without cholecystitis. Patient has not been eating for a whole day, she started having vomiting on Friday.  Syncope most likely is due to vasovagal reaction. She did not have hypoglycemia upon arriving the hospital. She is complaining some right upper quadrant abdominal discomfort, found to have cholelithiasis without cholecystitis.  Occasional nausea vomiting could be from cholelithiasis.   Echocardiogram was performed, check fraction 60 to 65% with normal diastolic function.  Followed pulm hypertension. Telemetry so far has no arrhythmia. Patient did not have recurrence of dizziness or syncope.  Nausea has resolved.  She is medically stable for discharge. Please refer patient to general surgery for cholelithiasis.     Hypokalemia Likely due to nausea vomiting.  Potassium has normalized.   Possible acute lower  UTI ruled out Urine culture showed multiple species, not consistent with UTI.   Dyslipidemia - We will continue statin therapy.   Essential hypertension - Continue antihypertensive therapy.   GERD without esophagitis - We will continue PPI therapy and Carafate.   Reactive thrombocytosis. Improving.       Consultants: None Procedures performed: None  Disposition: Home Diet recommendation:  Discharge Diet Orders (From admission, onward)     Start     Ordered   06/30/23 0000  Diet - low sodium heart healthy        06/30/23 1029           Cardiac diet DISCHARGE MEDICATION: Allergies as of 06/30/2023       Reactions   Codeine Swelling        Medication List     STOP taking these medications    dicyclomine 10 MG capsule Commonly known as: BENTYL   doxycycline 100 MG tablet Commonly known as: VIBRA-TABS   omeprazole 40 MG capsule Commonly known as: PRILOSEC       TAKE these medications    aspirin EC 81 MG tablet Take 81 mg by mouth.   atorvastatin 40 MG tablet Commonly known as: LIPITOR TAKE 1 TABLET BY MOUTH EVERY DAY   fluticasone 50 MCG/ACT nasal spray Commonly known as: FLONASE SPRAY 2 SPRAYS INTO EACH NOSTRIL EVERY DAY   hydroxyurea 500 MG capsule Commonly known as: HYDREA Take 1 capsule (500 mg total) by mouth See admin instructions. Take 1000mg  on Wednesdays and Sundays, and take 500mg  daily for the rest of the week.   hyoscyamine 0.125 MG tablet Commonly known as: LEVSIN Take  0.125 mg by mouth every 4 (four) hours as needed.   losartan 25 MG tablet Commonly known as: COZAAR Take 1 tablet (25 mg total) by mouth daily.   metoprolol succinate 50 MG 24 hr tablet Commonly known as: TOPROL-XL TAKE 1 TABLET BY MOUTH EVERY DAY WITH OR IMMEDIATELY FOLLOWING A MEAL   pantoprazole 40 MG tablet Commonly known as: PROTONIX Take 1 tablet by mouth daily.   sucralfate 1 g tablet Commonly known as: CARAFATE Take by mouth.         Follow-up Information     Lorre Munroe, NP Follow up in 1 week(s).   Specialties: Internal Medicine, Emergency Medicine Contact information: [address] [place] [address] [phone]                Discharge Exam: [name] Weights   06/28/23 0600  Weight: 63.5 kg   General exam: Appears calm and comfortable  Respiratory system: Clear to auscultation. Respiratory effort normal. Cardiovascular system: S1 & S2 heard, RRR. No JVD, murmurs, rubs, gallops or clicks. No pedal edema. Gastrointestinal system: Abdomen is nondistended, soft and nontender. No organomegaly or masses felt. Normal bowel sounds heard. Central nervous system: Alert and oriented. No focal neurological deficits. Extremities: Symmetric 5 x 5 power. Skin: No rashes, lesions or ulcers Psychiatry: Judgement and insight appear normal. Mood & affect appropriate.    Condition at discharge: good  The results of significant diagnostics from this hospitalization (including imaging, microbiology, ancillary and laboratory) are listed below for reference.   Imaging Studies: US Abdomen Limited RUQ (LIVER/GB) Result Date: 06/29/2023 CLINICAL DATA:  [id number] Abdominal pain [id number] EXAM: ULTRASOUND ABDOMEN LIMITED RIGHT UPPER QUADRANT COMPARISON:  March seventeenth 2023 FINDINGS: Gallbladder: Several cholelithiasis are noted. No wall thickening visualized. No sonographic Murphy sign noted by sonographer. Common bile duct: Diameter: Visualized portion measures 2 mm, within normal limits. Liver: There are 2 benign cysts noted the RIGHT liver, not significantly changed in comparison to prior CT. Largest measures approximately 15 mm. Within normal limits in parenchymal echogenicity. Portal vein is patent on color Doppler imaging with normal direction of blood flow towards the liver. Other: None. IMPRESSION: Cholelithiasis without sonographic evidence of acute cholecystitis. Electronically Signed   By: Meda Klinefelter M.D.   On:  06/29/2023 16:45   ECHOCARDIOGRAM COMPLETE Result Date: 06/29/2023    ECHOCARDIOGRAM REPORT   Patient Name:   Tiffany Richardson Date of Exam: 06/29/2023 Medical Rec #:  [id number]    Height:       64.0 in Accession #:    [id number]   Weight:       140.0 lb Date of Birth:  16-Dec-1945    BSA:          1.681 m Patient Age:    77 years     BP:           131/74 mmHg Patient Gender: F            HR:           93 bpm. Exam Location:  [facility] Procedure: 2D Echo (Both Spectral and Color Flow Doppler were utilized during            procedure). Indications:     Syncope R55  History:         Patient has no prior history of Echocardiogram examinations.  Sonographer:     Elwin Sleight RDCS Referring Phys:  Gomez Cleverly LAMA Diagnosing Phys: Julien Nordmann MD  Sonographer Comments: Image  acquisition challenging due to respiratory motion. IMPRESSIONS  1. Left ventricular ejection fraction, by estimation, is 60 to 65%. The left ventricle has normal function. The left ventricle has no regional wall motion abnormalities. Left ventricular diastolic parameters were normal.  2. Right ventricular systolic function is normal. The right ventricular size is normal. There is mildly elevated pulmonary artery systolic pressure. The estimated right ventricular systolic pressure is 40.5 mmHg.  3. The mitral valve is normal in structure. Moderate mitral valve regurgitation. No evidence of mitral stenosis.  4. Tricuspid valve regurgitation is moderate.  5. The aortic valve is tricuspid. Aortic valve regurgitation is mild. Aortic valve sclerosis is present, with no evidence of aortic valve stenosis.  6. The inferior vena cava is normal in size with greater than 50% respiratory variability, suggesting right atrial pressure of 3 mmHg. FINDINGS  Left Ventricle: Left ventricular ejection fraction, by estimation, is 60 to 65%. The left ventricle has normal function. The left ventricle has no regional wall motion abnormalities. Strain was performed and the global  longitudinal strain is indeterminate. The left ventricular internal cavity size was normal in size. There is no left ventricular hypertrophy. Left ventricular diastolic parameters were normal. Right Ventricle: The right ventricular size is normal. No increase in right ventricular wall thickness. Right ventricular systolic function is normal. There is mildly elevated pulmonary artery systolic pressure. The tricuspid regurgitant velocity is 2.98  m/s, and with an assumed right atrial pressure of 5 mmHg, the estimated right ventricular systolic pressure is 40.5 mmHg. Left Atrium: Left atrial size was normal in size. Right Atrium: Right atrial size was normal in size. Pericardium: There is no evidence of pericardial effusion. Mitral Valve: The mitral valve is normal in structure. Moderate mitral valve regurgitation. No evidence of mitral valve stenosis. Tricuspid Valve: The tricuspid valve is normal in structure. Tricuspid valve regurgitation is moderate . No evidence of tricuspid stenosis. Aortic Valve: The aortic valve is tricuspid. Aortic valve regurgitation is mild. Aortic regurgitation PHT measures 382 msec. Aortic valve sclerosis is present, with no evidence of aortic valve stenosis. Aortic valve peak gradient measures 5.1 mmHg. Pulmonic Valve: The pulmonic valve was normal in structure. Pulmonic valve regurgitation is not visualized. No evidence of pulmonic stenosis. Aorta: The aortic root is normal in size and structure. Venous: The inferior vena cava is normal in size with greater than 50% respiratory variability, suggesting right atrial pressure of 3 mmHg. IAS/Shunts: No atrial level shunt detected by color flow Doppler. Additional Comments: 3D was performed not requiring image post processing on an independent workstation and was indeterminate.  LEFT VENTRICLE PLAX 2D LVIDd:         3.90 cm   Diastology LVIDs:         2.60 cm   LV e' medial:    8.81 cm/s LV PW:         1.00 cm   LV E/e' medial:  11.4 LV IVS:         1.10 cm   LV e' lateral:   10.70 cm/s LVOT diam:     1.70 cm   LV E/e' lateral: 9.3 LV SV:         40 LV SV Index:   24 LVOT Area:     2.27 cm  RIGHT VENTRICLE RV Basal diam:  2.80 cm RV S prime:     13.50 cm/s TAPSE (M-mode): 2.1 cm LEFT ATRIUM             Index  RIGHT ATRIUM           Index LA diam:        3.90 cm 2.32 cm/m   RA Area:     11.00 cm LA Vol (A2C):   34.2 ml 20.34 ml/m  RA Volume:   21.10 ml  12.55 ml/m LA Vol (A4C):   31.8 ml 18.92 ml/m LA Biplane Vol: 33.2 ml 19.75 ml/m  AORTIC VALVE                 PULMONIC VALVE AV Area (Vmax): 1.88 cm     PV Vmax:        1.00 m/s AV Vmax:        113.00 cm/s  PV Peak grad:   4.0 mmHg AV Peak Grad:   5.1 mmHg     RVOT Peak grad: 2 mmHg LVOT Vmax:      93.60 cm/s LVOT Vmean:     63.200 cm/s LVOT VTI:       0.176 m AI PHT:         382 msec  AORTA Ao Root diam: 2.50 cm Ao Asc diam:  2.80 cm MITRAL VALVE                TRICUSPID VALVE MV Area (PHT): 6.27 cm     TR Peak grad:   35.5 mmHg MV Decel Time: 121 msec     TR Vmax:        298.00 cm/s MV E velocity: 100.00 cm/s MV A velocity: 65.20 cm/s   SHUNTS MV E/A ratio:  1.53         Systemic VTI:  0.18 m                             Systemic Diam: 1.70 cm Julien Nordmann MD Electronically signed by Julien Nordmann MD Signature Date/Time: 06/29/2023/3:21:33 PM    Final    DG Chest Port 1 View Result Date: 06/28/2023 CLINICAL DATA:  Syncope EXAM: PORTABLE CHEST 1 VIEW COMPARISON:  09/06/2007 FINDINGS: The heart size and mediastinal contours are within normal limits. Both lungs are clear. The visualized skeletal structures are unremarkable. IMPRESSION: No active disease. Electronically Signed   By: Jasmine Pang M.D.   On: 06/28/2023 00:43   CT Cervical Spine Wo Contrast Result Date: 06/28/2023 CLINICAL DATA:  Fall syncope EXAM: CT CERVICAL SPINE WITHOUT CONTRAST TECHNIQUE: Multidetector CT imaging of the cervical spine was performed without intravenous contrast. Multiplanar CT image reconstructions were  also generated. RADIATION DOSE REDUCTION: This exam was performed according to the departmental dose-optimization program which includes automated exposure control, adjustment of the mA and/or kV according to patient size and/or use of iterative reconstruction technique. COMPARISON:  None Available. FINDINGS: Alignment: Normal. Skull base and vertebrae: No acute fracture. Multiple small lucent lesions within the vertebral bodies. Soft tissues and spinal canal: No prevertebral fluid or swelling. No visible canal hematoma. Disc levels:  Mild multilevel degenerative changes. Upper chest: Apical scarring Other: None IMPRESSION: 1. No CT evidence for acute osseous abnormality. 2. Multiple small lucent lesions within the vertebral bodies, correlate for any history of marrow disease such as myeloma Electronically Signed   By: Jasmine Pang M.D.   On: 06/28/2023 00:43   CT HEAD WO CONTRAST Result Date: 06/28/2023 CLINICAL DATA:  Facial trauma syncope hit head EXAM: CT HEAD WITHOUT CONTRAST TECHNIQUE: Contiguous axial images were obtained from the base of the skull through the vertex  without intravenous contrast. RADIATION DOSE REDUCTION: This exam was performed according to the departmental dose-optimization program which includes automated exposure control, adjustment of the mA and/or kV according to patient size and/or use of iterative reconstruction technique. COMPARISON:  CT brain 09/07/2007 FINDINGS: Brain: No acute territorial infarction, hemorrhage or intracranial mass. The ventricles are nonenlarged Vascular: No hyperdense vessel or unexpected calcification. Skull: Normal. Negative for fracture or focal lesion. Sinuses/Orbits: No acute finding. Other: None IMPRESSION: No CT evidence for acute intracranial abnormality Electronically Signed   By: Jasmine Pang M.D.   On: 06/28/2023 00:39    Microbiology: Results for orders placed or performed during the hospital encounter of 06/27/23  Resp panel by RT-PCR (RSV,  Flu A&B, Covid) Anterior Nasal Swab     Status: None   Collection Time: 06/27/23 11:42 PM   Specimen: Anterior Nasal Swab  Result Value Ref Range Status   SARS Coronavirus 2 by RT PCR NEGATIVE NEGATIVE Final    Comment: (NOTE) SARS-CoV-2 target nucleic acids are NOT DETECTED.  The SARS-CoV-2 RNA is generally detectable in upper respiratory specimens during the acute phase of infection. The lowest concentration of SARS-CoV-2 viral copies this assay can detect is 138 copies/mL. A negative result does not preclude SARS-Cov-2 infection and should not be used as the sole basis for treatment or other patient management decisions. A negative result may occur with  improper specimen collection/handling, submission of specimen other than nasopharyngeal swab, presence of viral mutation(s) within the areas targeted by this assay, and inadequate number of viral copies(<138 copies/mL). A negative result must be combined with clinical observations, patient history, and epidemiological information. The expected result is Negative.  Fact Sheet for Patients:  BloggerCourse.com  Fact Sheet for Healthcare Providers:  SeriousBroker.it  This test is no t yet approved or cleared by the [place] FDA and  has been authorized for detection and/or diagnosis of SARS-CoV-2 by FDA under an Emergency Use Authorization (EUA). This EUA will remain  in effect (meaning this test can be used) for the duration of the COVID-19 declaration under Section 564(b)(1) of the Act, 21 U.S.C.section 360bbb-3(b)(1), unless the authorization is terminated  or revoked sooner.       Influenza A by PCR NEGATIVE NEGATIVE Final   Influenza B by PCR NEGATIVE NEGATIVE Final    Comment: (NOTE) The Xpert Xpress SARS-CoV-2/FLU/RSV plus assay is intended as an aid in the diagnosis of influenza from Nasopharyngeal swab specimens and should not be used as a sole basis for  treatment. Nasal washings and aspirates are unacceptable for Xpert Xpress SARS-CoV-2/FLU/RSV testing.  Fact Sheet for Patients: BloggerCourse.com  Fact Sheet for Healthcare Providers: SeriousBroker.it  This test is not yet approved or cleared by the [place] FDA and has been authorized for detection and/or diagnosis of SARS-CoV-2 by FDA under an Emergency Use Authorization (EUA). This EUA will remain in effect (meaning this test can be used) for the duration of the COVID-19 declaration under Section 564(b)(1) of the Act, 21 U.S.C. section 360bbb-3(b)(1), unless the authorization is terminated or revoked.     Resp Syncytial Virus by PCR NEGATIVE NEGATIVE Final    Comment: (NOTE) Fact Sheet for Patients: BloggerCourse.com  Fact Sheet for Healthcare Providers: SeriousBroker.it  This test is not yet approved or cleared by the [place] FDA and has been authorized for detection and/or diagnosis of SARS-CoV-2 by FDA under an Emergency Use Authorization (EUA). This EUA will remain in effect (meaning this test can be used) for the duration of  the COVID-19 declaration under Section 564(b)(1) of the Act, 21 U.S.C. section 360bbb-3(b)(1), unless the authorization is terminated or revoked.  Performed at [facility], [address], [place], [place] [address]   Urine Culture     Status: Abnormal   Collection Time: 06/28/23 12:42 AM   Specimen: Urine, Clean Catch  Result Value Ref Range Status   Specimen Description   Final    URINE, CLEAN CATCH Performed at [facility], [address] [place], [place] [address]    Special Requests   Final    NONE Performed at [facility], [address] [place], [place] [address]    Culture MULTIPLE SPECIES PRESENT, SUGGEST RECOLLECTION (A)  Final   Report Status 06/30/2023 FINAL  Final     Labs: CBC: Recent Labs  Lab 06/27/23 2241 06/28/23 0511 06/30/23 0512  WBC 8.7 8.7 6.5  HGB 13.0 11.9* 11.5*  HCT 37.0 33.9* 33.4*  MCV 114.9* 111.1* 114.4*  PLT 514* 439* 399   Basic Metabolic Panel: Recent Labs  Lab 06/27/23 2241 06/28/23 0511 06/30/23 0512  NA 138 140 142  K 2.9* 4.1 4.0  CL 103 107 106  CO2 23 24 27   GLUCOSE 164* 123* 101*  BUN 16 13 11   CREATININE 0.94 0.73 0.83  CALCIUM 8.7* 8.3* 8.7*  MG  --   --  2.0   Liver Function Tests: No results for input(s): "AST", "ALT", "ALKPHOS", "BILITOT", "PROT", "ALBUMIN" in the last 168 hours. CBG: No results for input(s): "GLUCAP" in the last 168 hours.  Discharge time spent: greater than 30 minutes.  Signed: Marrion Coy, MD [facility] 06/30/2023

## 2023-06-30 NOTE — Plan of Care (Signed)

## 2023-07-04 ENCOUNTER — Encounter: Payer: Self-pay | Admitting: Internal Medicine

## 2023-07-04 ENCOUNTER — Ambulatory Visit (INDEPENDENT_AMBULATORY_CARE_PROVIDER_SITE_OTHER): Payer: PRIVATE HEALTH INSURANCE | Admitting: Internal Medicine

## 2023-07-04 VITALS — BP 124/68 | Ht 64.0 in | Wt 141.0 lb

## 2023-07-04 DIAGNOSIS — K8020 Calculus of gallbladder without cholecystitis without obstruction: Secondary | ICD-10-CM

## 2023-07-04 MED ORDER — ONDANSETRON 4 MG PO TBDP: 4.0000 mg | ORAL_TABLET | Freq: Three times a day (TID) | ORAL | 0 refills | Status: DC | PRN

## 2023-07-04 NOTE — Progress Notes (Signed)
 Subjective:    Patient ID: Tiffany Richardson, female    DOB: 1945/05/24, 78 y.o.   MRN: [id number]  HPI  Patient presents to clinic today for TCM hospital follow-up.  She presented to the ER 3/7 after 2 syncopal events accompanied by poor intake, RUQ abdominal pain, nausea and vomiting.  Labs were unremarkable.  Urinalysis was concerning for infection but culture showed contamination. She was treated with IV fluids and antibiotics.  ECG was normal.  Chest x-ray was normal.  CT head did not show any evidence of acute finding.  CT cervical spine showed:  IMPRESSION: 1. No CT evidence for acute osseous abnormality. 2. Multiple small lucent lesions within the vertebral bodies, correlate for any history of marrow disease such as myeloma  RUQ abdominal ultrasound showed evidence of gallstones without acute cholecystitis.  It felt that her syncope could be vasovagal versus pain related to gallstones.  She was discharged on 3/10, advised to follow-up with her PCP and general surgery.  Since that time, she reports persistent poor appetite and nausea. She is not having any abdominal pain or urinary symptoms at this time. She has intermittent palpitations but she has not had any syncopal episodes.  Review of Systems   Past Medical History:  Diagnosis Date   Allergy    Cancer (HCC)    basal cell   Emphysema of lung (HCC)    Hypertension     Current Outpatient Medications  Medication Sig Dispense Refill   aspirin EC 81 MG tablet Take 81 mg by mouth.     atorvastatin (LIPITOR) 40 MG tablet TAKE 1 TABLET BY MOUTH EVERY DAY 90 tablet 1   fluticasone (FLONASE) 50 MCG/ACT nasal spray SPRAY 2 SPRAYS INTO EACH NOSTRIL EVERY DAY 48 mL 0   hydroxyurea (HYDREA) 500 MG capsule Take 1 capsule (500 mg total) by mouth See admin instructions. Take 1000mg  on Wednesdays and Sundays, and take 500mg  daily for the rest of the week. 94 capsule 2   hyoscyamine (LEVSIN) 0.125 MG tablet Take 0.125 mg by mouth every 4 (four)  hours as needed.     losartan (COZAAR) 25 MG tablet Take 1 tablet (25 mg total) by mouth daily. 90 tablet 1   metoprolol succinate (TOPROL-XL) 50 MG 24 hr tablet TAKE 1 TABLET BY MOUTH EVERY DAY WITH OR IMMEDIATELY FOLLOWING A MEAL 90 tablet 1   pantoprazole (PROTONIX) 40 MG tablet Take 1 tablet by mouth daily.     sucralfate (CARAFATE) 1 g tablet Take by mouth.     No current facility-administered medications for this visit.    Allergies  Allergen Reactions   Codeine Swelling    Family History  Problem Relation Age of Onset   Leukemia Mother    Heart disease Father    Non-Hodgkin's lymphoma Daughter     Social History   Socioeconomic History   Marital status: Widowed    Spouse name: Not on file   Number of children: Not on file   Years of education: Not on file   Highest education level: 12th grade  Occupational History   Not on file  Tobacco Use   Smoking status: Never   Smokeless tobacco: Never  Vaping Use   Vaping status: Never Used  Substance and Sexual Activity   Alcohol use: No    Alcohol/week: 0.0 standard drinks of alcohol   Drug use: No   Sexual activity: Not on file  Other Topics Concern   Not on file  Social History  Narrative   Not on file   Social Drivers of Health   Financial Resource Strain: Low Risk  (05/22/2023)   Overall Financial Resource Strain (CARDIA)    Difficulty of Paying Living Expenses: Not hard at all  Food Insecurity: No Food Insecurity (06/28/2023)   Hunger Vital Sign    Worried About Running Out of Food in the Last Year: Never true    Ran Out of Food in the Last Year: Never true  Transportation Needs: No Transportation Needs (06/28/2023)   PRAPARE - Administrator, Civil Service (Medical): No    Lack of Transportation (Non-Medical): No  Physical Activity: Insufficiently Active (05/22/2023)   Exercise Vital Sign    Days of Exercise per Week: 4 days    Minutes of Exercise per Session: 30 min  Stress: No Stress Concern  Present (05/22/2023)   [employer] of Occupational Health - Occupational Stress Questionnaire    Feeling of Stress : Only a little  Social Connections: Moderately Integrated (06/28/2023)   Social Connection and Isolation Panel [NHANES]    Frequency of Communication with Friends and Family: More than three times a week    Frequency of Social Gatherings with Friends and Family: Twice a week    Attends Religious Services: More than 4 times per year    Active Member of [employer] or Organizations: Yes    Attends Banker Meetings: More than 4 times per year    Marital Status: Widowed  Intimate Partner Violence: Not At Risk (06/28/2023)   Humiliation, Afraid, Rape, and Kick questionnaire    Fear of Current or Ex-Partner: No    Emotionally Abused: No    Physically Abused: No    Sexually Abused: No     Constitutional: Denies fever, malaise, fatigue, headache or abrupt weight changes.  HEENT: Denies eye pain, eye redness, ear pain, ringing in the ears, wax buildup, runny nose, nasal congestion, bloody nose, or sore throat. Respiratory: Denies difficulty breathing, shortness of breath, cough or sputum production.   Cardiovascular: Denies chest pain, chest tightness, palpitations or swelling in the hands or feet.  Gastrointestinal: Pt reports poor appetite, abdominal bloating, nausea. Denies abdominal pain, constipation, diarrhea or blood in the stool.  GU: Denies urgency, frequency, pain with urination, burning sensation, blood in urine, odor or discharge. Musculoskeletal: Denies decrease in range of motion, difficulty with gait, muscle pain or joint pain and swelling.  Skin: Denies redness, rashes, lesions or ulcercations.  Neurological: Pt reports jitteriness. Denies dizziness, difficulty with memory, difficulty with speech or problems with balance and coordination.  Psych: Pt reports stress. Denies anxiety, depression, SI/HI.  No other specific complaints in a complete review of  systems (except as listed in HPI above).      Objective:   Physical Exam BP 124/68 (BP Location: Left Arm, Patient Position: Sitting, Cuff Size: Normal)   Ht 5\' 4"  (1.626 m)   Wt 141 lb (64 kg)   BMI 24.20 kg/m   Wt Readings from Last 3 Encounters:  06/28/23 139 lb 15.9 oz (63.5 kg)  05/29/23 140 lb (63.5 kg)  05/22/23 143 lb 12.8 oz (65.2 kg)    General: Appears her stated age, well developed, well nourished in NAD. Skin: Warm, dry and intact.  HEENT: Head: normal shape and size; Eyes: sclera white, no icterus, conjunctiva pink, PERRLA and EOMs intact;  Cardiovascular: Normal rate and rhythm. S1,S2 noted.  No murmur, rubs or gallops noted. No JVD or BLE edema. No carotid bruits noted.  Pulmonary/Chest: Normal effort and positive vesicular breath sounds. No respiratory distress. No wheezes, rales or ronchi noted.  Abdomen: Soft and nontender. Normal bowel sounds. No distention or masses noted.  Musculoskeletal: No difficulty with gait.  Neurological: Alert and oriented. Coordination normal.    BMET    Component Value Date/Time   NA 142 06/30/2023 0512   NA 142 10/11/2016 0932   K 4.0 06/30/2023 0512   CL 106 06/30/2023 0512   CO2 27 06/30/2023 0512   GLUCOSE 101 (H) 06/30/2023 0512   BUN 11 06/30/2023 0512   BUN 12 10/11/2016 0932   CREATININE 0.83 06/30/2023 0512   CREATININE 0.89 05/29/2023 1306   CREATININE 0.90 04/02/2021 0840   CALCIUM 8.7 (L) 06/30/2023 0512   GFRNONAA >60 06/30/2023 0512   GFRNONAA >60 05/29/2023 1306   GFRNONAA 63 08/07/2020 1001   GFRAA 74 08/07/2020 1001    Lipid Panel     Component Value Date/Time   CHOL 124 09/02/2022 0810   CHOL 220 (H) 10/11/2016 0932   TRIG 168 (H) 09/02/2022 0810   HDL 34 (L) 09/02/2022 0810   HDL 42 10/11/2016 0932   CHOLHDL 3.6 09/02/2022 0810   LDLCALC 65 09/02/2022 0810    CBC    Component Value Date/Time   WBC 6.5 06/30/2023 0512   RBC 2.92 (L) 06/30/2023 0512   HGB 11.5 (L) 06/30/2023 0512   HGB  12.9 05/29/2023 1306   HGB 13.0 05/29/2015 1404   HCT 33.4 (L) 06/30/2023 0512   HCT 39.0 05/29/2015 1404   PLT 399 06/30/2023 0512   PLT 430 (H) 05/29/2023 1306   PLT 684 (H) 05/29/2015 1404   MCV 114.4 (H) 06/30/2023 0512   MCV 90 05/29/2015 1404   MCH 39.4 (H) 06/30/2023 0512   MCHC 34.4 06/30/2023 0512   RDW 14.6 06/30/2023 0512   RDW 13.6 05/29/2015 1404   LYMPHSABS 2.0 05/29/2023 1306   LYMPHSABS 3.5 (H) 05/29/2015 1404   MONOABS 0.4 05/29/2023 1306   EOSABS 0.1 05/29/2023 1306   EOSABS 0.3 05/29/2015 1404   BASOSABS 0.1 05/29/2023 1306   BASOSABS 0.1 05/29/2015 1404    Hgb A1C No results found for: "HGBA1C"          Assessment & Plan:   TCM hospital follow-up for syncope, gallstones, abnormal CT cervical spine:  Hospital notes, labs and imaging reviewed Referral to general surgery for further evaluation of her gallstones Rx for Zofran 4 mg ODT every 8 hours as needed She declines Zio patch monitor at this time but advised if she has another syncopal episode, I would recommend this She declines repeating her urinalysis as she is not having any urinary symptoms at this time Will refer to oncology for further evaluation of abnormal CT of the cervical spine to r/o myeloma.  RTC in 2 months for your annual exam Nicki Reaper, NP

## 2023-07-07 ENCOUNTER — Encounter: Payer: Self-pay | Admitting: Internal Medicine

## 2023-07-09 ENCOUNTER — Encounter: Payer: Self-pay | Admitting: Surgery

## 2023-07-09 ENCOUNTER — Ambulatory Visit (INDEPENDENT_AMBULATORY_CARE_PROVIDER_SITE_OTHER): Payer: Self-pay | Admitting: Surgery

## 2023-07-09 VITALS — BP 145/85 | HR 74 | Temp 98.0°F | Ht 64.0 in | Wt 140.6 lb

## 2023-07-09 DIAGNOSIS — K8020 Calculus of gallbladder without cholecystitis without obstruction: Secondary | ICD-10-CM | POA: Diagnosis not present

## 2023-07-09 NOTE — Patient Instructions (Signed)
 Gallbladder Eating Plan High blood cholesterol, obesity, a sedentary lifestyle, an unhealthy diet, and diabetes are risk factors for developing gallstones. If you have a gallbladder condition, you may have trouble digesting fats and tolerating high fat intake. Eating a low-fat diet can help reduce your symptoms and may be helpful before and after having surgery to remove your gallbladder (cholecystectomy). Your health care provider may recommend that you work with a dietitian to help you reduce the amount of fat in your diet. What are tips for following this plan? General guidelines Limit your fat intake to less than 30% of your total daily calories. If you eat around 1,800 calories each day, this means eating less than 60 grams (g) of fat per day. Fat is an important part of a healthy diet. Eating a low-fat diet can make it hard to maintain a healthy body weight. Ask your dietitian how much fat, calories, and other nutrients you need each day. Eat small, frequent meals throughout the day instead of three large meals. Drink at least 8-10 cups (1.9-2.4 L) of fluid a day. Drink enough fluid to keep your urine pale yellow. If you drink alcohol: Limit how much you have to: 0-1 drink a day for women who are not pregnant. 0-2 drinks a day for men. Know how much alcohol is in a drink. In the [place] one drink equals one 12 oz bottle of beer (355 mL), one 5 oz glass of wine (148 mL), or one 1 oz glass of hard liquor (44 mL). Reading food labels  Check nutrition facts on food labels for the amount of fat per serving. Choose foods with less than 3 grams of fat per serving. Shopping Choose nonfat and low-fat healthy foods. Look for the words "nonfat," "low-fat," or "fat-free." Avoid buying processed or prepackaged foods. Cooking Cook using low-fat methods, such as baking, broiling, grilling, or boiling. Cook with small amounts of healthy fats, such as olive oil, grapeseed oil, canola oil, avocado oil, or  sunflower oil. What foods are recommended?  All fresh, frozen, or canned fruits and vegetables. Whole grains. Low-fat or nonfat (skim) milk and yogurt. Lean meat, skinless poultry, fish, eggs, and beans. Low-fat protein supplement powders or drinks. Spices and herbs. The items listed above may not be a complete list of foods and beverages you can eat and drink. Contact a dietitian for more information. What foods are not recommended? High-fat foods. These include baked goods, fast food, fatty cuts of meat, ice cream, french toast, sweet rolls, pizza, cheese bread, foods covered with butter, creamy sauces, or cheese. Fried foods. These include french fries, tempura, battered fish, breaded chicken, fried breads, and sweets. Foods that cause bloating and gas. The items listed above may not be a complete list of foods that you should avoid. Contact a dietitian for more information. Summary A low-fat diet can be helpful if you have a gallbladder condition, or before and after gallbladder surgery. Limit your fat intake to less than 30% of your total daily calories. This is about 60 g of fat if you eat 1,800 calories each day. Eat small, frequent meals throughout the day instead of three large meals. This information is not intended to replace advice given to you by your health care provider. Make sure you discuss any questions you have with your health care provider. Document Revised: 03/23/2021 Document Reviewed: 03/23/2021 Elsevier Patient Education  2024 [employer].

## 2023-07-09 NOTE — Progress Notes (Signed)
 07/09/2023  Reason for Visit: Symptomatic cholelithiasis  Requesting Provider: Nicki Reaper, NP  History of Present Illness: Tiffany Richardson is a 78 y.o. female presenting for evaluation of symptomatic cholelithiasis.  The patient was admitted to the hospital on 06/27/2023 with 2 syncopal events associated with poor p.o. intake, right upper quadrant pain, nausea, and vomiting.  The patient reports that she was at a  restaurant with a friend when her symptoms started and she started feeling very weak.  She has been having diarrhea and had not had much p.o. intake at the time and she had a syncopal episode.  She had extensive workup in the hospital which was negative.  She eventually had an ultrasound of the right upper quadrant on 06/29/2023 as the patient was also reporting some epigastric/right upper quadrant pain.  This showed cholelithiasis but without any evidence of gallbladder wall thickening or pericholecystic fluid.  The patient was eventually discharged.  The patient reports today that prior to this hospital admission she also had 2 milder episodes of nausea and discomfort in the upper abdomen after having mashed potatoes with gravy.  Denies any fevers or chills.  Currently she is asymptomatic.  She does have family history of cholecystectomy in her mother and sibling.  Of note, as part of her workup for syncopal events she had a CT scan of her head and cervical spine.  In the cervical spine, there was evidence of multiple small lucent lesions within the vertebral bodies.  She has an appointment with Dr. Cathie Hoops on 08/28/2023 for further evaluation of this.  The patient really sees Dr. Cathie Hoops for thrombocythemia and is currently on hydroxyurea and aspirin.  Past Medical History: Past Medical History:  Diagnosis Date   Allergy    Codiene   Cancer (HCC)    basal cell   Emphysema of lung (HCC)    GERD (gastroesophageal reflux disease)    Hyperlipidemia    Hypertension      Past Surgical History: Past  Surgical History:  Procedure Laterality Date   basal cell removed     Rt eye   BIOPSY  12/19/2022   Procedure: BIOPSY;  Surgeon: Jaynie Collins, DO;  Location: [facility] ENDOSCOPY;  Service: Gastroenterology;;   COLONOSCOPY     COLONOSCOPY WITH PROPOFOL N/A 12/19/2022   Procedure: COLONOSCOPY WITH PROPOFOL;  Surgeon: Jaynie Collins, DO;  Location: [facility] ENDOSCOPY;  Service: Gastroenterology;  Laterality: N/A;   ESOPHAGOGASTRODUODENOSCOPY (EGD) WITH PROPOFOL N/A 12/19/2022   Procedure: ESOPHAGOGASTRODUODENOSCOPY (EGD) WITH PROPOFOL;  Surgeon: Jaynie Collins, DO;  Location: [facility] ENDOSCOPY;  Service: Gastroenterology;  Laterality: N/A;   POLYPECTOMY  12/19/2022   Procedure: POLYPECTOMY;  Surgeon: Jaynie Collins, DO;  Location: [facility] ENDOSCOPY;  Service: Gastroenterology;;   squamous cell removed       Home Medications: Prior to Admission medications   Medication Sig Start Date End Date Taking? Authorizing Provider  aspirin EC 81 MG tablet Take 81 mg by mouth.   Yes [provider]  atorvastatin (LIPITOR) 40 MG tablet TAKE 1 TABLET BY MOUTH EVERY DAY 04/11/23  Yes Baity, Salvadore Oxford, NP  fluticasone [facility]) 50 MCG/ACT nasal spray SPRAY 2 SPRAYS INTO EACH NOSTRIL EVERY DAY 06/17/23  Yes Baity, Salvadore Oxford, NP  hydroxyurea (HYDREA) 500 MG capsule Take 1 capsule (500 mg total) by mouth See admin instructions. Take 1000mg  on Wednesdays and Sundays, and take 500mg  daily for the rest of the week. 05/29/23  Yes Rickard Patience, MD  hyoscyamine (LEVSIN) 0.125 MG tablet  Take 0.125 mg by mouth every 4 (four) hours as needed.   Yes [provider]  losartan (COZAAR) 25 MG tablet Take 1 tablet (25 mg total) by mouth daily. 02/05/23  Yes Baity, Salvadore Oxford, NP  metoprolol succinate (TOPROL-XL) 50 MG 24 hr tablet TAKE 1 TABLET BY MOUTH EVERY DAY WITH OR IMMEDIATELY FOLLOWING A MEAL 04/11/23  Yes Baity, Salvadore Oxford, NP  ondansetron (ZOFRAN-ODT) 4 MG disintegrating tablet Take 1 tablet (4 mg  total) by mouth every 8 (eight) hours as needed for nausea or vomiting. 07/04/23  Yes Baity, Salvadore Oxford, NP  sucralfate (CARAFATE) 1 g tablet Take by mouth. 08/06/22  Yes [provider]    Allergies: Allergies  Allergen Reactions   Codeine Swelling    Social History:  reports that she has never smoked. She has never used smokeless tobacco. She reports that she does not drink alcohol and does not use drugs.   Family History: Family History  Problem Relation Age of Onset   Leukemia Mother    Heart disease Father    Non-Hodgkin's lymphoma Daughter     Review of Systems: Review of Systems  Constitutional:  Negative for chills and fever.  Respiratory:  Negative for shortness of breath.   Cardiovascular:  Negative for chest pain.  Gastrointestinal:  Positive for abdominal pain, nausea and vomiting.    Physical Exam BP (!) 145/85   Pulse 74   Temp 98 F (36.7 C) (Oral)   Ht 5\' 4"  (1.626 m)   Wt 140 lb 9.6 oz (63.8 kg)   SpO2 98%   BMI 24.13 kg/m  CONSTITUTIONAL: No acute distress, well-nourished HEENT:  Normocephalic, atraumatic, extraocular motion intact. RESPIRATORY:  Lungs are clear, and breath sounds are equal bilaterally. Normal respiratory effort without pathologic use of accessory muscles. CARDIOVASCULAR: Heart is regular without murmurs, gallops, or rubs. GI: The abdomen is soft, nondistended, with minimal discomfort/soreness in the epigastric area.  Negative Murphy's sign.  MUSCULOSKELETAL:  Normal muscle strength and tone in all four extremities.  No peripheral edema or cyanosis. NEUROLOGIC:  Motor and sensation is grossly normal.  Cranial nerves are grossly intact. PSYCH:  Alert and oriented to person, place and time. Affect is normal.  Laboratory Analysis: Labs from 06/27/2023: Sodium 138, potassium 2.9, chloride 103, CO2 23, BUN 16, creatinine 0.94.  WBC 8.7, hemoglobin 13, hematocrit 37, platelets 514.  Imaging: Ultrasound RUQ on  06/29/2023: IMPRESSION: Cholelithiasis without sonographic evidence of acute cholecystitis.  Assessment and Plan: This is a 78 y.o. female with symptomatic cholelithiasis.  - Discussed with patient the findings on her ultrasound on her last hospital admission how these can correlate with episodes of biliary colic.  Discussed with patient that based on her symptoms, likely she has been experiencing biliary colic due to her cholelithiasis.  Discussed with patient the options for potential watchful waiting versus surgical management of this.  The patient seems hesitant about proceeding with surgery at this point that she would rather proceed with conservative management.  Discussed with her the option then for a low-fat diet and watch how her symptoms proceed.  Discussed with the patient how fatty foods or greasy foods can contribute to worsening issues with biliary colic. - Patient will follow-up with me in 1 month.  If her symptoms have not significantly improved, then we can discuss potential for surgical management.  However if she is doing well without any further symptoms, we could potentially continue managing with a low-fat diet for now. - All of  her questions have been answered.  I spent 30 minutes dedicated to the care of this patient on the date of this encounter to include pre-visit review of records, face-to-face time with the patient discussing diagnosis and management, and any post-visit coordination of care.   Howie Ill, MD [facility]

## 2023-07-11 NOTE — Telephone Encounter (Signed)
 Patient has called and decided to go ahead with the surgery for gallbladder. Please advise.

## 2023-07-18 ENCOUNTER — Ambulatory Visit: Admitting: Surgery

## 2023-07-18 ENCOUNTER — Telehealth: Payer: Self-pay | Admitting: *Deleted

## 2023-07-18 ENCOUNTER — Encounter: Payer: Self-pay | Admitting: Internal Medicine

## 2023-07-18 ENCOUNTER — Encounter: Payer: Self-pay | Admitting: Surgery

## 2023-07-18 VITALS — BP 125/79 | HR 73 | Temp 97.8°F | Ht 64.0 in | Wt 140.0 lb

## 2023-07-18 DIAGNOSIS — K8020 Calculus of gallbladder without cholecystitis without obstruction: Secondary | ICD-10-CM | POA: Diagnosis not present

## 2023-07-18 NOTE — H&P (View-Only) (Signed)
 07/18/2023  History of Present Illness: Tiffany Richardson is a 78 y.o. female presenting for follow-up of symptomatic cholelithiasis.  She was last seen on 07/09/2023 at which time the patient wanted to try conservative management and was started on a low-fat diet.  She was supposed to follow-up in 1 month but she reports that at home she started feeling a couple of times were she had "twinges" in the epigastric and right upper quadrant areas.  She has not had any episodes of severe pain or associated nausea or vomiting.  However she would rather not wait for 1 of those episodes to happen and would like to proceed with surgery.  Currently she is asymptomatic.  Past Medical History: Past Medical History:  Diagnosis Date   Allergy    Codiene   Cancer (HCC)    basal cell   Emphysema of lung (HCC)    GERD (gastroesophageal reflux disease)    Hyperlipidemia    Hypertension      Past Surgical History: Past Surgical History:  Procedure Laterality Date   basal cell removed     Rt eye   BIOPSY  12/19/2022   Procedure: BIOPSY;  Surgeon: Jaynie Collins, DO;  Location: [facility] ENDOSCOPY;  Service: Gastroenterology;;   COLONOSCOPY     COLONOSCOPY WITH PROPOFOL N/A 12/19/2022   Procedure: COLONOSCOPY WITH PROPOFOL;  Surgeon: Jaynie Collins, DO;  Location: [facility] ENDOSCOPY;  Service: Gastroenterology;  Laterality: N/A;   ESOPHAGOGASTRODUODENOSCOPY (EGD) WITH PROPOFOL N/A 12/19/2022   Procedure: ESOPHAGOGASTRODUODENOSCOPY (EGD) WITH PROPOFOL;  Surgeon: Jaynie Collins, DO;  Location: [facility] ENDOSCOPY;  Service: Gastroenterology;  Laterality: N/A;   POLYPECTOMY  12/19/2022   Procedure: POLYPECTOMY;  Surgeon: Jaynie Collins, DO;  Location: [facility] ENDOSCOPY;  Service: Gastroenterology;;   squamous cell removed       Home Medications: Prior to Admission medications   Medication Sig Start Date End Date Taking? Authorizing Provider  aspirin EC 81 MG tablet Take 81 mg by mouth.    [provider]  atorvastatin (LIPITOR) 40 MG tablet TAKE 1 TABLET BY MOUTH EVERY DAY 04/11/23   Lorre Munroe, NP  fluticasone [facility]) 50 MCG/ACT nasal spray SPRAY 2 SPRAYS INTO EACH NOSTRIL EVERY DAY 06/17/23   Lorre Munroe, NP  hydroxyurea (HYDREA) 500 MG capsule Take 1 capsule (500 mg total) by mouth See admin instructions. Take 1000mg  on Wednesdays and Sundays, and take 500mg  daily for the rest of the week. 05/29/23   Rickard Patience, MD  hyoscyamine (LEVSIN) 0.125 MG tablet Take 0.125 mg by mouth every 4 (four) hours as needed.    [provider]  losartan (COZAAR) 25 MG tablet Take 1 tablet (25 mg total) by mouth daily. 02/05/23   Lorre Munroe, NP  metoprolol succinate (TOPROL-XL) 50 MG 24 hr tablet TAKE 1 TABLET BY MOUTH EVERY DAY WITH OR IMMEDIATELY FOLLOWING A MEAL 04/11/23   Baity, Salvadore Oxford, NP  ondansetron (ZOFRAN-ODT) 4 MG disintegrating tablet Take 1 tablet (4 mg total) by mouth every 8 (eight) hours as needed for nausea or vomiting. 07/04/23   Lorre Munroe, NP  sucralfate (CARAFATE) 1 g tablet Take by mouth. 08/06/22   [provider]    Allergies: Allergies  Allergen Reactions   Codeine Swelling    Review of Systems: Review of Systems  Constitutional:  Negative for chills and fever.  Respiratory:  Negative for shortness of breath.   Cardiovascular:  Negative for chest pain.  Gastrointestinal:  Positive for abdominal  pain. Negative for nausea and vomiting.    Physical Exam BP 125/79   Pulse 73   Temp 97.8 F (36.6 C) (Oral)   Ht 5\' 4"  (1.626 m)   Wt 140 lb (63.5 kg)   SpO2 98%   BMI 24.03 kg/m  CONSTITUTIONAL: No acute distress HEENT:  Normocephalic, atraumatic, extraocular motion intact. RESPIRATORY:  Normal respiratory effort without pathologic use of accessory muscles. CARDIOVASCULAR: Regular rhythm and rate. GI: The abdomen is soft, nondistended, currently nontender to palpation.  Negative Murphy's sign.  NEUROLOGIC:  Motor and sensation is  grossly normal.  Cranial nerves are grossly intact. PSYCH:  Alert and oriented to person, place and time. Affect is normal.  Labs/Imaging: None new since her last visit.  Assessment and Plan: This is a 78 y.o. female with symptomatic cholelithiasis.  - The patient has had mild symptoms since her last visit with me and she would prefer to proceed with surgery rather than waiting more.  Discussed with her that we could proceed with a robotic assisted cholecystectomy she is in agreement with this. - Discussed with her then the plan for a robotic assisted cholecystectomy and reviewed the surgery at length with her including the planned incisions, risks of bleeding, infection, injury to surrounding structures, the use of ICG to better evaluate the biliary anatomy, but this will be an outpatient procedure, postoperative activity restrictions, pain control, and she is willing to proceed. - We will schedule the patient for surgery on 07/30/23.  We will send for medical clearance.  She will need to hold her aspirin for 5 days prior to surgery, with last dose on 07/24/2023.  All of her questions have been answered.  I spent 20 minutes dedicated to the care of this patient on the date of this encounter to include pre-visit review of records, face-to-face time with the patient discussing diagnosis and management, and any post-visit coordination of care.   Howie Ill, MD [facility]

## 2023-07-18 NOTE — Patient Instructions (Addendum)
 We will get Medical Clearance from your PCP to hold your aspirin 5 days prior to surgery. Last Dose will be 07/24/23    You have requested to have your gallbladder removed. This will be done at [facility] with Dr. Aleen Campi.  You will need to stop taking your GLP-1 injectable (weight loss) medications 8 days before your surgery to avoid any complications with anesthesia.  You will most likely be out of work 1-2 weeks for this surgery.  If you have FMLA or disability paperwork that needs filled out you may drop this off at our office or this can be faxed to [phone].  You will return after your post-op appointment with a lifting restriction for approximately 4 more weeks.  You will be able to eat anything you would like to following surgery. But, start by eating a bland diet and advance this as tolerated. The Gallbladder diet is below, please go as closely by this diet as possible prior to surgery to avoid any further attacks.  Please see the (blue)pre-care form that you have been given today. Our surgery scheduler will call you to verify surgery date and to go over information.   If you have any questions, please call our office.  Laparoscopic Cholecystectomy Laparoscopic cholecystectomy is surgery to remove the gallbladder. The gallbladder is located in the upper right part of the abdomen, behind the liver. It is a storage sac for bile, which is produced in the liver. Bile aids in the digestion and absorption of fats. Cholecystectomy is often done for inflammation of the gallbladder (cholecystitis). This condition is usually caused by a buildup of gallstones (cholelithiasis) in the gallbladder. Gallstones can block the flow of bile, and that can result in inflammation and pain. In severe cases, emergency surgery may be required. If emergency surgery is not required, you will have time to prepare for the procedure. Laparoscopic surgery is an alternative to open surgery. Laparoscopic  surgery has a shorter recovery time. Your common bile duct may also need to be examined during the procedure. If stones are found in the common bile duct, they may be removed. LET [facility] CARE PROVIDER KNOW ABOUT: Any allergies you have. All medicines you are taking, including vitamins, herbs, eye drops, creams, and over-the-counter medicines. Previous problems you or members of your family have had with the use of anesthetics. Any blood disorders you have. Previous surgeries you have had.  Any medical conditions you have. RISKS AND COMPLICATIONS Generally, this is a safe procedure. However, problems may occur, including: Infection. Bleeding. Allergic reactions to medicines. Damage to other structures or organs. A stone remaining in the common bile duct. A bile leak from the cyst duct that is clipped when your gallbladder is removed. The need to convert to open surgery, which requires a larger incision in the abdomen. This may be necessary if your surgeon thinks that it is not safe to continue with a laparoscopic procedure. BEFORE THE PROCEDURE Ask your health care provider about: Changing or stopping your regular medicines. This is especially important if you are taking diabetes medicines or blood thinners. Taking medicines such as aspirin and ibuprofen. These medicines can thin your blood. Do not take these medicines before your procedure if your health care provider instructs you not to. Follow instructions from your health care provider about eating or drinking restrictions. Let your health care provider know if you develop a cold or an infection before surgery. Plan to have someone take you home after the procedure. Ask  your health care provider how your surgical site will be marked or identified. You may be given antibiotic medicine to help prevent infection. PROCEDURE To reduce your risk of infection: Your health care team will wash or sanitize their hands. Your skin will be  washed with soap. An IV tube may be inserted into one of your veins. You will be given a medicine to make you fall asleep (general anesthetic). A breathing tube will be placed in your mouth. The surgeon will make several small cuts (incisions) in your abdomen. A thin, lighted tube (laparoscope) that has a tiny camera on the end will be inserted through one of the small incisions. The camera on the laparoscope will send a picture to a TV screen (monitor) in the operating room. This will give the surgeon a good view inside your abdomen. A gas will be pumped into your abdomen. This will expand your abdomen to give the surgeon more room to perform the surgery. Other tools that are needed for the procedure will be inserted through the other incisions. The gallbladder will be removed through one of the incisions. After your gallbladder has been removed, the incisions will be closed with stitches (sutures), staples, or skin glue. Your incisions may be covered with a bandage (dressing). The procedure may vary among health care providers and hospitals. AFTER THE PROCEDURE Your blood pressure, heart rate, breathing rate, and blood oxygen level will be monitored often until the medicines you were given have worn off. You will be given medicines as needed to control your pain.   This information is not intended to replace advice given to you by your health care provider. Make sure you discuss any questions you have with your health care provider.   Document Released: 04/08/2005 Document Revised: 12/28/2014 Document Reviewed: 11/18/2012 Elsevier Interactive Patient Education 2016 Else[employer] Inc[employer]   Low-Fat Diet for Gallbladder Conditions A low-fat diet can be helpful if you have pancreatitis or a gallbladder condition. With these conditions, your pancreas and gallbladder have trouble digesting fats. A healthy eating plan with less fat will help rest your pancreas and gallbladder and reduce your  symptoms. WHAT DO I NEED TO KNOW ABOUT THIS DIET? Eat a low-fat diet. Reduce your fat intake to less than 20-30% of your total daily calories. This is less than 50-60 g of fat per day. Remember that you need some fat in your diet. Ask your dietician what your daily goal should be. Choose nonfat and low-fat healthy foods. Look for the words "nonfat," "low fat," or "fat free." As a guide, look on the label and choose foods with less than 3 g of fat per serving. Eat only one serving. Avoid alcohol. Do not smoke. If you need help quitting, talk with your health care provider. Eat small frequent meals instead of three large heavy meals. WHAT FOODS CAN I EAT? Grains Include healthy grains and starches such as potatoes, wheat bread, fiber-rich cereal, and brown rice. Choose whole grain options whenever possible. In adults, whole grains should account for 45-65% of your daily calories.  Fruits and Vegetables Eat plenty of fruits and vegetables. Fresh fruits and vegetables add fiber to your diet. Meats and Other Protein Sources Eat lean meat such as chicken and pork. Trim any fat off of meat before cooking it. Eggs, fish, and beans are other sources of protein. In adults, these foods should account for 10-35% of your daily calories. Dairy Choose low-fat milk and dairy options. Dairy includes fat and protein, as well  as calcium.  Fats and Oils Limit high-fat foods such as fried foods, sweets, baked goods, sugary drinks.  Other Creamy sauces and condiments, such as mayonnaise, can add extra fat. Think about whether or not you need to use them, or use smaller amounts or low fat options. WHAT FOODS ARE NOT RECOMMENDED? High fat foods, such as: [employer]. Ice cream. [place] toast. Sweet rolls. Pizza. Cheese bread. Foods covered with batter, butter, creamy sauces, or cheese. Fried foods. Sugary drinks and desserts. Foods that cause gas or bloating   This information is not intended to replace  advice given to you by your health care provider. Make sure you discuss any questions you have with your health care provider.   Document Released: 04/13/2013 Document Reviewed: 04/13/2013 Elsevier Interactive Patient Education [employer]

## 2023-07-18 NOTE — Progress Notes (Signed)
 07/18/2023  History of Present Illness: Tiffany Richardson is a 78 y.o. female presenting for follow-up of symptomatic cholelithiasis.  She was last seen on 07/09/2023 at which time the patient wanted to try conservative management and was started on a low-fat diet.  She was supposed to follow-up in 1 month but she reports that at home she started feeling a couple of times were she had "twinges" in the epigastric and right upper quadrant areas.  She has not had any episodes of severe pain or associated nausea or vomiting.  However she would rather not wait for 1 of those episodes to happen and would like to proceed with surgery.  Currently she is asymptomatic.  Past Medical History: Past Medical History:  Diagnosis Date   Allergy    Codiene   Cancer (HCC)    basal cell   Emphysema of lung (HCC)    GERD (gastroesophageal reflux disease)    Hyperlipidemia    Hypertension      Past Surgical History: Past Surgical History:  Procedure Laterality Date   basal cell removed     Rt eye   BIOPSY  12/19/2022   Procedure: BIOPSY;  Surgeon: Jaynie Collins, DO;  Location: [facility] ENDOSCOPY;  Service: Gastroenterology;;   COLONOSCOPY     COLONOSCOPY WITH PROPOFOL N/A 12/19/2022   Procedure: COLONOSCOPY WITH PROPOFOL;  Surgeon: Jaynie Collins, DO;  Location: [facility] ENDOSCOPY;  Service: Gastroenterology;  Laterality: N/A;   ESOPHAGOGASTRODUODENOSCOPY (EGD) WITH PROPOFOL N/A 12/19/2022   Procedure: ESOPHAGOGASTRODUODENOSCOPY (EGD) WITH PROPOFOL;  Surgeon: Jaynie Collins, DO;  Location: [facility] ENDOSCOPY;  Service: Gastroenterology;  Laterality: N/A;   POLYPECTOMY  12/19/2022   Procedure: POLYPECTOMY;  Surgeon: Jaynie Collins, DO;  Location: [facility] ENDOSCOPY;  Service: Gastroenterology;;   squamous cell removed       Home Medications: Prior to Admission medications   Medication Sig Start Date End Date Taking? Authorizing Provider  aspirin EC 81 MG tablet Take 81 mg by mouth.    [provider]  atorvastatin (LIPITOR) 40 MG tablet TAKE 1 TABLET BY MOUTH EVERY DAY 04/11/23   Lorre Munroe, NP  fluticasone [facility]) 50 MCG/ACT nasal spray SPRAY 2 SPRAYS INTO EACH NOSTRIL EVERY DAY 06/17/23   Lorre Munroe, NP  hydroxyurea (HYDREA) 500 MG capsule Take 1 capsule (500 mg total) by mouth See admin instructions. Take 1000mg  on Wednesdays and Sundays, and take 500mg  daily for the rest of the week. 05/29/23   Rickard Patience, MD  hyoscyamine (LEVSIN) 0.125 MG tablet Take 0.125 mg by mouth every 4 (four) hours as needed.    [provider]  losartan (COZAAR) 25 MG tablet Take 1 tablet (25 mg total) by mouth daily. 02/05/23   Lorre Munroe, NP  metoprolol succinate (TOPROL-XL) 50 MG 24 hr tablet TAKE 1 TABLET BY MOUTH EVERY DAY WITH OR IMMEDIATELY FOLLOWING A MEAL 04/11/23   Baity, Salvadore Oxford, NP  ondansetron (ZOFRAN-ODT) 4 MG disintegrating tablet Take 1 tablet (4 mg total) by mouth every 8 (eight) hours as needed for nausea or vomiting. 07/04/23   Lorre Munroe, NP  sucralfate (CARAFATE) 1 g tablet Take by mouth. 08/06/22   [provider]    Allergies: Allergies  Allergen Reactions   Codeine Swelling    Review of Systems: Review of Systems  Constitutional:  Negative for chills and fever.  Respiratory:  Negative for shortness of breath.   Cardiovascular:  Negative for chest pain.  Gastrointestinal:  Positive for abdominal  pain. Negative for nausea and vomiting.    Physical Exam BP 125/79   Pulse 73   Temp 97.8 F (36.6 C) (Oral)   Ht 5\' 4"  (1.626 m)   Wt 140 lb (63.5 kg)   SpO2 98%   BMI 24.03 kg/m  CONSTITUTIONAL: No acute distress HEENT:  Normocephalic, atraumatic, extraocular motion intact. RESPIRATORY:  Normal respiratory effort without pathologic use of accessory muscles. CARDIOVASCULAR: Regular rhythm and rate. GI: The abdomen is soft, nondistended, currently nontender to palpation.  Negative Murphy's sign.  NEUROLOGIC:  Motor and sensation is  grossly normal.  Cranial nerves are grossly intact. PSYCH:  Alert and oriented to person, place and time. Affect is normal.  Labs/Imaging: None new since her last visit.  Assessment and Plan: This is a 78 y.o. female with symptomatic cholelithiasis.  - The patient has had mild symptoms since her last visit with me and she would prefer to proceed with surgery rather than waiting more.  Discussed with her that we could proceed with a robotic assisted cholecystectomy she is in agreement with this. - Discussed with her then the plan for a robotic assisted cholecystectomy and reviewed the surgery at length with her including the planned incisions, risks of bleeding, infection, injury to surrounding structures, the use of ICG to better evaluate the biliary anatomy, but this will be an outpatient procedure, postoperative activity restrictions, pain control, and she is willing to proceed. - We will schedule the patient for surgery on 07/30/23.  We will send for medical clearance.  She will need to hold her aspirin for 5 days prior to surgery, with last dose on 07/24/2023.  All of her questions have been answered.  I spent 20 minutes dedicated to the care of this patient on the date of this encounter to include pre-visit review of records, face-to-face time with the patient discussing diagnosis and management, and any post-visit coordination of care.   Howie Ill, MD [facility]

## 2023-07-18 NOTE — Telephone Encounter (Signed)
 Faxed Medical Clearance to Nicki Reaper NP at [phone]

## 2023-07-20 ENCOUNTER — Other Ambulatory Visit: Payer: Self-pay | Admitting: Internal Medicine

## 2023-07-21 ENCOUNTER — Telehealth: Payer: Self-pay

## 2023-07-21 ENCOUNTER — Telehealth: Payer: Self-pay | Admitting: *Deleted

## 2023-07-21 ENCOUNTER — Encounter: Payer: Self-pay | Admitting: Internal Medicine

## 2023-07-21 ENCOUNTER — Ambulatory Visit (INDEPENDENT_AMBULATORY_CARE_PROVIDER_SITE_OTHER): Payer: PRIVATE HEALTH INSURANCE | Admitting: Internal Medicine

## 2023-07-21 VITALS — BP 130/72 | HR 79 | Ht 64.0 in | Wt 142.8 lb

## 2023-07-21 DIAGNOSIS — Z01818 Encounter for other preprocedural examination: Secondary | ICD-10-CM

## 2023-07-21 DIAGNOSIS — K8020 Calculus of gallbladder without cholecystitis without obstruction: Secondary | ICD-10-CM | POA: Diagnosis not present

## 2023-07-21 NOTE — Patient Instructions (Signed)
Cholelithiasis  Cholelithiasis is a disease in which gallstones form in the gallbladder. The gallbladder is an organ that stores bile. Bile is a fluid that helps to digest fats. Gallstones begin as small crystals and can slowly grow into stones. They may cause no symptoms until they block the gallbladder duct, or cystic duct, when the gallbladder tightens, or contracts, after food is eaten. This can cause pain and is known as a gallbladder attack, or biliary colic. There are two main types of gallstones: Cholesterol stones. These are the most common type of gallstone. These stones are made of hardened cholesterol and are usually yellow-green in color. Pigment stones. These are dark in color and are made of a red-yellow substance, called bilirubin,that forms when hemoglobin from red blood cells breaks down. What are the causes? This condition may be caused by too little or too much of the substances that are in bile. This can happen if the bile: Has too much bilirubin. This can happen in certain blood diseases, such as sickle cell anemia. Has too much cholesterol. Does not have enough bile salts. These salts help the body absorb and digest fats. It can also happen if the gallbladder is not emptying completely. This is common during pregnancy. What increases the risk? The following factors may make you more likely to develop this condition: Being older than 77 years of age. Eating a diet that is heavy in fried foods, fat, and refined carbohydrates, such as white bread and white rice. Being female. Having multiple pregnancies. Using medicines that contain female hormones (estrogen) for a long time. Having certain medical problems, such as: Diabetes mellitus. Obesity. Cystic fibrosis. Crohn's disease. Cirrhosis or other long-term (chronic) liver disease. Certain blood diseases, such as sickle cell anemia or leukemia. Having a family history of gallstones. Losing weight quickly. What are the  signs or symptoms? In many cases, having gallstones causes no symptoms. These are called silent gallstones. If a gallstone blocks your bile duct, it can cause a gallbladder attack. The main symptom of a gallbladder attack is sudden pain in the upper right part of the abdomen. The pain: Usually comes at night or after eating. Can last for one hour or more. Can spread to your right shoulder, back, or chest. Can feel like indigestion. This is discomfort, burning, or fullness in your upper abdomen. If the bile duct is blocked for more than a few hours, it can cause an infection or inflammation of your gallbladder (cholecystitis), liver, or pancreas. This can cause: Nausea or vomiting. Bloating. Pain in your abdomen that lasts for 5 hours or longer. Tenderness in your upper abdomen, often in the upper right section and under your rib cage. Fever or chills. Skin or the white parts of your eyes turning yellow (jaundice). This usually happens when a stone has blocked bile from passing through the bile duct. Dark pee (urine) or light-colored poop (stools). How is this diagnosed? This condition may be diagnosed based on: A physical exam. Your medical history. Ultrasound. CT scan. MRI. You may also have other tests, including: Blood tests to check for infection or inflammation. The HIDA scan to see the gallbladder and the bile ducts. An endoscope to check for blockage in the bile ducts. How is this treated? Treatment depends on the severity of your symptoms. Silent gallstones do not need treatment. You may need treatment if a blockage causes a gallbladder attack or other symptoms. Treatment may include: If symptoms are mild, you may care for yourself at home.  For mild symptoms: Stop eating and drinking for 12-24 hours. You may drink water and clear liquids. This helps to "cool down" your gallbladder. After 1 or 2 days, eat a diet of simple or clear foods, such as broths and crackers. Take medicines  for pain or nausea. Take antibiotics if you have an infection. If symptoms are severe, you may: Stay in the hospital for pain control or to treat severe infection. Have surgery to remove the gallbladder (cholecystectomy). This is the most common treatment if all other treatments have not worked. Take medicines to break up gallstones. Medicines may be used for up to 6-12 months. Have an procedure to capture and remove gallstones. Follow these instructions at home: Medicines Take over-the-counter and prescription medicines only as told by your health care provider. If you were prescribed antibiotics, take them as told by your provider. Do not stop using the antibiotic even if you start to feel better. Ask your provider if the medicine prescribed to you requires you to avoid driving or using machinery. Eating and drinking Drink enough fluid to keep your pee pale yellow. This is important during a gallbladder attack. Water and clear liquids are preferred. Follow a healthy diet. This includes: Reducing fatty foods, such as fried food and foods high in cholesterol. Reducing refined carbohydrates, such as white bread and white rice. Eating more fiber. Aim for foods such as almonds, fruit, and beans. General instructions Do not use any products that contain nicotine or tobacco. These products include cigarettes, chewing tobacco, and vaping devices, such as e-cigarettes. If you need help quitting, ask your provider. Maintain a healthy weight. Keep all follow-up visits. These may include seeing a specialist or a Careers adviser. Where to find more information [employer] of Diabetes and Digestive and Kidney Diseases: [email or web address] Contact a health care provider if: You think you have had a gallbladder attack. You have been diagnosed with silent gallstones and you develop indigestion or pain in your abdomen. You have pain from a gallbladder attack that lasts for more than 2 hours. You begin to have  attacks more often. You have nausea. You have dark pee or light-colored poop. Get help right away if: You have pain in your abdomen that lasts for more than 5 hours or is getting worse. You have a fever or chills. You have vomiting that does not go away. You develop jaundice. This information is not intended to replace advice given to you by your health care provider. Make sure you discuss any questions you have with your health care provider. Document Revised: 01/21/2022 Document Reviewed: 01/21/2022 Elsevier Patient Education  2024 [employer].

## 2023-07-21 NOTE — Telephone Encounter (Signed)
 Received Medical Clearance from [facility] NP. Patient risk assessment is low and is optimized for surgery.

## 2023-07-21 NOTE — Telephone Encounter (Signed)
 Patient has been advised of Pre-Admission date/time, and Surgery date at [facility].  Surgery Date: 07/30/23 Preadmission Testing Date: 07/24/23 (phone 1p-4p)  Patient has been made aware to call [phone], between 1-3:00pm the day before surgery, to find out what time to arrive for surgery.

## 2023-07-21 NOTE — Progress Notes (Signed)
 Subjective:    Patient ID: Tiffany Richardson, female    DOB: April 14, 1946, 78 y.o.   MRN: [id number]  HPI  Patient presents to clinic today for preoperative clearance.  She will be having a robotic assisted cholecystectomy by Dr. Aleen Campi on 07/30/23.  RUQ abdominal ultrasound from 06/29/2023 showed:  IMPRESSION: Cholelithiasis without sonographic evidence of acute cholecystitis.  She is currently having some discomfort in the RUQ but no intense abdominal pain.  She denies nausea, vomiting.  She reports intermittent loose stools but attributes this to her history of IBS.  Review of Systems   Past Medical History:  Diagnosis Date   Allergy    Codiene   Cancer (HCC)    basal cell   Emphysema of lung (HCC)    GERD (gastroesophageal reflux disease)    Hyperlipidemia    Hypertension     Current Outpatient Medications  Medication Sig Dispense Refill   aspirin EC 81 MG tablet Take 81 mg by mouth.     atorvastatin (LIPITOR) 40 MG tablet TAKE 1 TABLET BY MOUTH EVERY DAY 90 tablet 1   fluticasone (FLONASE) 50 MCG/ACT nasal spray SPRAY 2 SPRAYS INTO EACH NOSTRIL EVERY DAY 48 mL 0   hydroxyurea (HYDREA) 500 MG capsule Take 1 capsule (500 mg total) by mouth See admin instructions. Take 1000mg  on Wednesdays and Sundays, and take 500mg  daily for the rest of the week. 94 capsule 2   hyoscyamine (LEVSIN) 0.125 MG tablet Take 0.125 mg by mouth every 4 (four) hours as needed.     losartan (COZAAR) 25 MG tablet Take 1 tablet (25 mg total) by mouth daily. 90 tablet 1   metoprolol succinate (TOPROL-XL) 50 MG 24 hr tablet TAKE 1 TABLET BY MOUTH EVERY DAY WITH OR IMMEDIATELY FOLLOWING A MEAL 90 tablet 1   ondansetron (ZOFRAN-ODT) 4 MG disintegrating tablet Take 1 tablet (4 mg total) by mouth every 8 (eight) hours as needed for nausea or vomiting. 30 tablet 0   sucralfate (CARAFATE) 1 g tablet Take by mouth.     No current facility-administered medications for this visit.    Allergies  Allergen Reactions    Codeine Swelling    Family History  Problem Relation Age of Onset   Leukemia Mother    Heart disease Father    Non-Hodgkin's lymphoma Daughter     Social History   Socioeconomic History   Marital status: Widowed    Spouse name: Not on file   Number of children: Not on file   Years of education: Not on file   Highest education level: 12th grade  Occupational History   Not on file  Tobacco Use   Smoking status: Never   Smokeless tobacco: Never  Vaping Use   Vaping status: Never Used  Substance and Sexual Activity   Alcohol use: No   Drug use: No   Sexual activity: Not Currently  Other Topics Concern   Not on file  Social History Narrative   Not on file   Social Drivers of Health   Financial Resource Strain: Low Risk  (05/22/2023)   Overall Financial Resource Strain (CARDIA)    Difficulty of Paying Living Expenses: Not hard at all  Food Insecurity: No Food Insecurity (06/28/2023)   Hunger Vital Sign    Worried About Running Out of Food in the Last Year: Never true    Ran Out of Food in the Last Year: Never true  Transportation Needs: No Transportation Needs (06/28/2023)   PRAPARE - Transportation  Lack of Transportation (Medical): No    Lack of Transportation (Non-Medical): No  Physical Activity: Insufficiently Active (05/22/2023)   Exercise Vital Sign    Days of Exercise per Week: 4 days    Minutes of Exercise per Session: 30 min  Stress: No Stress Concern Present (05/22/2023)   [employer] of Occupational Health - Occupational Stress Questionnaire    Feeling of Stress : Only a little  Social Connections: Moderately Integrated (06/28/2023)   Social Connection and Isolation Panel [NHANES]    Frequency of Communication with Friends and Family: More than three times a week    Frequency of Social Gatherings with Friends and Family: Twice a week    Attends Religious Services: More than 4 times per year    Active Member of [employer] or Organizations: Yes    Attends Occupational hygienist Meetings: More than 4 times per year    Marital Status: Widowed  Intimate Partner Violence: Not At Risk (06/28/2023)   Humiliation, Afraid, Rape, and Kick questionnaire    Fear of Current or Ex-Partner: No    Emotionally Abused: No    Physically Abused: No    Sexually Abused: No     Constitutional: Denies fever, malaise, fatigue, headache or abrupt weight changes.  HEENT: Denies eye pain, eye redness, ear pain, ringing in the ears, wax buildup, runny nose, nasal congestion, bloody nose, or sore throat. Respiratory: Denies difficulty breathing, shortness of breath, cough or sputum production.   Cardiovascular: Denies chest pain, chest tightness, palpitations or swelling in the hands or feet.  Gastrointestinal: Patient reports RUQ abdominal discomfort, alternating constipation and diarrhea.  Denies bloating, or blood in the stool.  GU: Denies urgency, frequency, pain with urination, burning sensation, blood in urine, odor or discharge. Musculoskeletal: Denies decrease in range of motion, difficulty with gait, muscle pain or joint pain and swelling.  Skin: Denies redness, rashes, lesions or ulcercations.  Neurological: Denies dizziness, difficulty with memory, difficulty with speech or problems with balance and coordination.  Psych: Denies anxiety, depression, SI/HI.  No other specific complaints in a complete review of systems (except as listed in HPI above).      Objective:   Physical Exam  BP 130/72 (BP Location: Right Arm, Patient Position: Sitting, Cuff Size: Normal)   Pulse 79   Ht 5\' 4"  (1.626 m)   Wt 142 lb 12.8 oz (64.8 kg)   SpO2 98%   BMI 24.51 kg/m   Wt Readings from Last 3 Encounters:  07/18/23 140 lb (63.5 kg)  07/09/23 140 lb 9.6 oz (63.8 kg)  07/04/23 141 lb (64 kg)    General: Appears her stated age, well developed, well nourished in NAD. Cardiovascular: Normal rate and rhythm. S1,S2 noted.  No murmur, rubs or gallops noted. No JVD or BLE edema.   Pulmonary/Chest: Normal effort and positive vesicular breath sounds. No respiratory distress. No wheezes, rales or ronchi noted.  Abdomen: Soft and nontender. Normal bowel sounds. No distention or masses noted. Musculoskeletal: No difficulty with gait.  Neurological: Alert and oriented.   BMET    Component Value Date/Time   NA 142 06/30/2023 0512   NA 142 10/11/2016 0932   K 4.0 06/30/2023 0512   CL 106 06/30/2023 0512   CO2 27 06/30/2023 0512   GLUCOSE 101 (H) 06/30/2023 0512   BUN 11 06/30/2023 0512   BUN 12 10/11/2016 0932   CREATININE 0.83 06/30/2023 0512   CREATININE 0.89 05/29/2023 1306   CREATININE 0.90 04/02/2021 0840   CALCIUM  8.7 (L) 06/30/2023 0512   GFRNONAA >60 06/30/2023 0512   GFRNONAA >60 05/29/2023 1306   GFRNONAA 63 08/07/2020 1001   GFRAA 74 08/07/2020 1001    Lipid Panel     Component Value Date/Time   CHOL 124 09/02/2022 0810   CHOL 220 (H) 10/11/2016 0932   TRIG 168 (H) 09/02/2022 0810   HDL 34 (L) 09/02/2022 0810   HDL 42 10/11/2016 0932   CHOLHDL 3.6 09/02/2022 0810   LDLCALC 65 09/02/2022 0810    CBC    Component Value Date/Time   WBC 6.5 06/30/2023 0512   RBC 2.92 (L) 06/30/2023 0512   HGB 11.5 (L) 06/30/2023 0512   HGB 12.9 05/29/2023 1306   HGB 13.0 05/29/2015 1404   HCT 33.4 (L) 06/30/2023 0512   HCT 39.0 05/29/2015 1404   PLT 399 06/30/2023 0512   PLT 430 (H) 05/29/2023 1306   PLT 684 (H) 05/29/2015 1404   MCV 114.4 (H) 06/30/2023 0512   MCV 90 05/29/2015 1404   MCH 39.4 (H) 06/30/2023 0512   MCHC 34.4 06/30/2023 0512   RDW 14.6 06/30/2023 0512   RDW 13.6 05/29/2015 1404   LYMPHSABS 2.0 05/29/2023 1306   LYMPHSABS 3.5 (H) 05/29/2015 1404   MONOABS 0.4 05/29/2023 1306   EOSABS 0.1 05/29/2023 1306   EOSABS 0.3 05/29/2015 1404   BASOSABS 0.1 05/29/2023 1306   BASOSABS 0.1 05/29/2015 1404    Hgb A1C No results found for: "HGBA1C"          Assessment & Plan:   Preoperative clearance for cholelithiasis:  ECG from  06/30/2023 reviewed Labs from 06/30/18/2025 reviewed, no need to repeat at this time Hold aspirin 5 days prior to surgery, restart postop as recommended by general surgery Surgical clearance form will be filled out and faxed back  RTC in 2 months for your annual exam Nicki Reaper, NP

## 2023-07-22 NOTE — Telephone Encounter (Signed)
 Requested Prescriptions  Pending Prescriptions Disp Refills   losartan (COZAAR) 25 MG tablet [Pharmacy Med Name: LOSARTAN POTASSIUM 25 MG TAB] 90 tablet 1    Sig: TAKE 1 TABLET (25 MG TOTAL) BY MOUTH DAILY.     Cardiovascular:  Angiotensin Receptor Blockers Failed - 07/22/2023  2:14 PM      Failed - Valid encounter within last 6 months    Recent Outpatient Visits           Yesterday Calculus of gallbladder without cholecystitis without obstruction   [place] [place], Salvadore Oxford, NP   2 weeks ago Gallstones   [place] [facility] [place], Salvadore Oxford, NP       Future Appointments             In 4 weeks [place], Salvadore Oxford, NP [facility]            Passed - Cr in normal range and within 180 days    Creatinine  Date Value Ref Range Status  05/29/2023 0.89 0.44 - 1.00 mg/dL Final   Creat  Date Value Ref Range Status  04/02/2021 0.90 0.60 - 1.00 mg/dL Final   Creatinine, Ser  Date Value Ref Range Status  06/30/2023 0.83 0.44 - 1.00 mg/dL Final         Passed - K in normal range and within 180 days    Potassium  Date Value Ref Range Status  06/30/2023 4.0 3.5 - 5.1 mmol/L Final         Passed - Patient is not pregnant      Passed - Last BP in normal range    BP Readings from Last 1 Encounters:  07/21/23 130/72

## 2023-07-24 ENCOUNTER — Other Ambulatory Visit: Payer: Self-pay

## 2023-07-24 ENCOUNTER — Encounter
Admission: RE | Admit: 2023-07-24 | Discharge: 2023-07-24 | Disposition: A | Discharge status: Home / Self Care | Payer: PRIVATE HEALTH INSURANCE | Source: Ambulatory Visit | Attending: Surgery | Admitting: Surgery

## 2023-07-24 HISTORY — DX: Anemia, unspecified: D64.9

## 2023-07-24 HISTORY — DX: Unspecified osteoarthritis, unspecified site: M19.90

## 2023-07-24 NOTE — Patient Instructions (Addendum)
 Your procedure is scheduled on: 07/30/23 - Wednesday Report to the Registration Desk on the 1st floor of the [facility]. To find out your arrival time, please call [phone] between 1PM - 3PM on: 07/29/23 - Tuesday If your arrival time is 6:00 am, do not arrive before that time as the [facility] entrance doors do not open until 6:00 am.  REMEMBER: Instructions that are not followed completely may result in serious medical risk, up to and including death; or upon the discretion of your surgeon and anesthesiologist your surgery may need to be rescheduled.  Do not eat food after midnight the night before surgery.  No gum chewing or hard candies.  You may however, drink CLEAR liquids up to 2 hours before you are scheduled to arrive for your surgery. Do not drink anything within 2 hours of your scheduled arrival time.  Clear liquids include: - water  - apple juice without pulp - gatorade (not RED colors) - black coffee or tea (Do NOT add milk or creamers to the coffee or tea) Do NOT drink anything that is not on this list.  One week prior to surgery: Stop Anti-inflammatories (NSAIDS) such as Advil, Aleve, Ibuprofen, Motrin, Naproxen, Naprosyn and Aspirin based products such as Excedrin, Goody's Powder, BC Powder. You may take Tylenol if needed for pain up until the day of surgery.  Stop ANY OVER THE COUNTER supplements until after surgery.  You may take Tylenol if needed for pain up until the day of surgery.  Hold aspirin beginning 07/25/23.  HOLD losartan (COZAAR) on the day of surgery.  ON THE DAY OF SURGERY ONLY TAKE THESE MEDICATIONS WITH SIPS OF WATER: none   No Alcohol for 24 hours before or after surgery.  No Smoking including e-cigarettes for 24 hours before surgery.  No chewable tobacco products for at least 6 hours before surgery.  No nicotine patches on the day of surgery.  Do not use any "recreational" drugs for at least a week (preferably 2 weeks) before your  surgery.  Please be advised that the combination of cocaine and anesthesia may have negative outcomes, up to and including death. If you test positive for cocaine, your surgery will be cancelled.  On the morning of surgery brush your teeth with toothpaste and water, you may rinse your mouth with mouthwash if you wish. Do not swallow any toothpaste or mouthwash.  Use CHG Soap or wipes as directed on instruction sheet.  Do not wear jewelry, make-up, hairpins, clips or nail polish.  For welded (permanent) jewelry: bracelets, anklets, waist bands, etc.  Please have this removed prior to surgery.  If it is not removed, there is a chance that hospital personnel will need to cut it off on the day of surgery.  Do not wear lotions, powders, or perfumes.   Do not shave body hair from the neck down 48 hours before surgery.  Contact lenses, hearing aids and dentures may not be worn into surgery.  Do not bring valuables to the hospital. [facility] is not responsible for any missing/lost belongings or valuables.   Notify your doctor if there is any change in your medical condition (cold, fever, infection).  Wear comfortable clothing (specific to your surgery type) to the hospital.  After surgery, you can help prevent lung complications by doing breathing exercises.  Take deep breaths and cough every 1-2 hours. Your doctor may order a device called an Incentive Spirometer to help you take deep breaths. When coughing or sneezing,  hold a pillow firmly against your incision with both hands. This is called "splinting." Doing this helps protect your incision. It also decreases belly discomfort.  If you are being admitted to the hospital overnight, leave your suitcase in the car. After surgery it may be brought to your room.  In case of increased patient census, it may be necessary for you, the patient, to continue your postoperative care in the Same Day Surgery department.  If you are being discharged  the day of surgery, you will not be allowed to drive home. You will need a responsible individual to drive you home and stay with you for 24 hours after surgery.   If you are taking public transportation, you will need to have a responsible individual with you.  Please call the Pre-admissions Testing Dept. at [phone] if you have any questions about these instructions.  Surgery Visitation Policy:  Patients having surgery or a procedure may have two visitors.  Children under the age of 77 must have an adult with them who is not the patient.  Inpatient Visitation:    Visiting hours are 7 a.m. to 8 p.m. Up to four visitors are allowed at one time in a patient room. The visitors may rotate out with other people during the day.  One visitor age 34 or older may stay with the patient overnight and must be in the room by 8 p.m.    Preparing for Surgery with CHLORHEXIDINE GLUCONATE (CHG) Soap  Chlorhexidine Gluconate (CHG) Soap  o An antiseptic cleaner that kills germs and bonds with the skin to continue killing germs even after washing  o Used for showering the night before surgery and morning of surgery  Before surgery, you can play an important role by reducing the number of germs on your skin.  CHG (Chlorhexidine gluconate) soap is an antiseptic cleanser which kills germs and bonds with the skin to continue killing germs even after washing.  Please do not use if you have an allergy to CHG or antibacterial soaps. If your skin becomes reddened/irritated stop using the CHG.  1. Shower the NIGHT BEFORE SURGERY and the MORNING OF SURGERY with CHG soap.  2. If you choose to wash your hair, wash your hair first as usual with your normal shampoo.  3. After shampooing, rinse your hair and body thoroughly to remove the shampoo.  4. Use CHG as you would any other liquid soap. You can apply CHG directly to the skin and wash gently with a scrungie or a clean washcloth.  5. Apply the CHG  soap to your body only from the neck down. Do not use on open wounds or open sores. Avoid contact with your eyes, ears, mouth, and genitals (private parts). Wash face and genitals (private parts) with your normal soap.  6. Wash thoroughly, paying special attention to the area where your surgery will be performed.  7. Thoroughly rinse your body with warm water.  8. Do not shower/wash with your normal soap after using and rinsing off the CHG soap.  9. Pat yourself dry with a clean towel.  10. Wear clean pajamas to bed the night before surgery.  12. Place clean sheets on your bed the night of your first shower and do not sleep with pets.  13. Shower again with the CHG soap on the day of surgery prior to arriving at the hospital.  14. Do not apply any deodorants/lotions/powders.  15. Please wear clean clothes to the hospital.

## 2023-07-30 ENCOUNTER — Ambulatory Visit: Admitting: Certified Registered"

## 2023-07-30 ENCOUNTER — Encounter: Payer: Self-pay | Admitting: Surgery

## 2023-07-30 ENCOUNTER — Ambulatory Visit
Admission: RE | Admit: 2023-07-30 | Discharge: 2023-07-30 | Disposition: A | Discharge status: Home / Self Care | Payer: PRIVATE HEALTH INSURANCE | Attending: Surgery | Admitting: Surgery

## 2023-07-30 ENCOUNTER — Encounter
Admission: RE | Disposition: A | Discharge status: Home / Self Care | Payer: Self-pay | Source: Home / Self Care | Attending: Surgery

## 2023-07-30 ENCOUNTER — Other Ambulatory Visit: Payer: Self-pay

## 2023-07-30 DIAGNOSIS — J439 Emphysema, unspecified: Secondary | ICD-10-CM | POA: Diagnosis not present

## 2023-07-30 DIAGNOSIS — I10 Essential (primary) hypertension: Secondary | ICD-10-CM | POA: Insufficient documentation

## 2023-07-30 DIAGNOSIS — J449 Chronic obstructive pulmonary disease, unspecified: Secondary | ICD-10-CM | POA: Diagnosis not present

## 2023-07-30 DIAGNOSIS — K8020 Calculus of gallbladder without cholecystitis without obstruction: Secondary | ICD-10-CM | POA: Diagnosis not present

## 2023-07-30 DIAGNOSIS — K8010 Calculus of gallbladder with chronic cholecystitis without obstruction: Secondary | ICD-10-CM | POA: Diagnosis not present

## 2023-07-30 SURGERY — CHOLECYSTECTOMY, ROBOT-ASSISTED, LAPAROSCOPIC
Anesthesia: General | Site: Abdomen

## 2023-07-30 MED ORDER — ORAL CARE MOUTH RINSE: 15.0000 mL | Freq: Once | OROMUCOSAL | Status: AC

## 2023-07-30 MED ORDER — CHLORHEXIDINE GLUCONATE CLOTH 2 % EX PADS
6.0000 | MEDICATED_PAD | Freq: Once | CUTANEOUS | Status: AC
  Administered 2023-07-30: 6 via TOPICAL

## 2023-07-30 MED ORDER — OXYCODONE HCL 5 MG/5ML PO SOLN: 5.0000 mg | Freq: Once | ORAL | Status: AC | PRN

## 2023-07-30 MED ORDER — FENTANYL CITRATE (PF) 100 MCG/2ML IJ SOLN
25.0000 ug | INTRAMUSCULAR | Status: DC | PRN
  Administered 2023-07-30: 25 ug via INTRAVENOUS

## 2023-07-30 MED ORDER — LACTATED RINGERS IV SOLN: INTRAVENOUS | Status: DC

## 2023-07-30 MED ORDER — BUPIVACAINE LIPOSOME 1.3 % IJ SUSP: 10.0000 mL | Freq: Once | INTRAMUSCULAR | Status: DC

## 2023-07-30 MED ORDER — INDOCYANINE GREEN 25 MG IV SOLR
1.2500 mg | INTRAVENOUS | Status: AC
  Administered 2023-07-30: 1.25 mg via INTRAVENOUS

## 2023-07-30 MED ORDER — DROPERIDOL 2.5 MG/ML IJ SOLN
0.6250 mg | Freq: Once | INTRAMUSCULAR | Status: AC
  Administered 2023-07-30: 0.625 mg via INTRAVENOUS

## 2023-07-30 MED ORDER — CEFAZOLIN SODIUM-DEXTROSE 2-4 GM/100ML-% IV SOLN
INTRAVENOUS | Status: AC
  Filled 2023-07-30: qty 100

## 2023-07-30 MED ORDER — ACETAMINOPHEN 500 MG PO TABS
1000.0000 mg | ORAL_TABLET | ORAL | Status: AC
  Administered 2023-07-30: 1000 mg via ORAL

## 2023-07-30 MED ORDER — FENTANYL CITRATE (PF) 100 MCG/2ML IJ SOLN
INTRAMUSCULAR | Status: DC | PRN
  Administered 2023-07-30 (×2): 50 ug via INTRAVENOUS

## 2023-07-30 MED ORDER — EPHEDRINE SULFATE-NACL 50-0.9 MG/10ML-% IV SOSY
PREFILLED_SYRINGE | INTRAVENOUS | Status: DC | PRN
  Administered 2023-07-30 (×2): 5 mg via INTRAVENOUS

## 2023-07-30 MED ORDER — INDOCYANINE GREEN 25 MG IV SOLR
INTRAVENOUS | Status: AC
  Filled 2023-07-30: qty 10

## 2023-07-30 MED ORDER — MIDAZOLAM HCL 2 MG/2ML IJ SOLN
INTRAMUSCULAR | Status: AC
  Filled 2023-07-30: qty 2

## 2023-07-30 MED ORDER — SUGAMMADEX SODIUM 200 MG/2ML IV SOLN
INTRAVENOUS | Status: DC | PRN
  Administered 2023-07-30: 200 mg via INTRAVENOUS

## 2023-07-30 MED ORDER — CHLORHEXIDINE GLUCONATE 0.12 % MT SOLN
OROMUCOSAL | Status: AC
  Filled 2023-07-30: qty 15

## 2023-07-30 MED ORDER — ACETAMINOPHEN 500 MG PO TABS: 1000.0000 mg | ORAL_TABLET | Freq: Four times a day (QID) | ORAL | Status: DC | PRN

## 2023-07-30 MED ORDER — OXYCODONE HCL 5 MG PO TABS
5.0000 mg | ORAL_TABLET | ORAL | 0 refills | Status: DC | PRN
Start: 2023-07-30 — End: 2023-08-13

## 2023-07-30 MED ORDER — DEXAMETHASONE SODIUM PHOSPHATE 10 MG/ML IJ SOLN
INTRAMUSCULAR | Status: DC | PRN
  Administered 2023-07-30: 10 mg via INTRAVENOUS
  Administered 2023-07-30: 5 mg via INTRAVENOUS

## 2023-07-30 MED ORDER — ONDANSETRON HCL 4 MG/2ML IJ SOLN
INTRAMUSCULAR | Status: DC | PRN
  Administered 2023-07-30: 4 mg via INTRAVENOUS

## 2023-07-30 MED ORDER — DROPERIDOL 2.5 MG/ML IJ SOLN
INTRAMUSCULAR | Status: AC
  Filled 2023-07-30: qty 2

## 2023-07-30 MED ORDER — OXYCODONE HCL 5 MG PO TABS
5.0000 mg | ORAL_TABLET | Freq: Once | ORAL | Status: AC | PRN
  Administered 2023-07-30: 5 mg via ORAL

## 2023-07-30 MED ORDER — ONDANSETRON HCL 4 MG/2ML IJ SOLN
INTRAMUSCULAR | Status: AC
  Filled 2023-07-30: qty 2

## 2023-07-30 MED ORDER — BUPIVACAINE LIPOSOME 1.3 % IJ SUSP
INTRAMUSCULAR | Status: DC | PRN
  Administered 2023-07-30: 10 mL

## 2023-07-30 MED ORDER — CHLORHEXIDINE GLUCONATE 0.12 % MT SOLN
15.0000 mL | Freq: Once | OROMUCOSAL | Status: AC
  Administered 2023-07-30: 15 mL via OROMUCOSAL

## 2023-07-30 MED ORDER — GABAPENTIN 100 MG PO CAPS
200.0000 mg | ORAL_CAPSULE | ORAL | Status: AC
Start: 2023-07-30 — End: 2023-07-30
  Administered 2023-07-30: 200 mg via ORAL

## 2023-07-30 MED ORDER — LIDOCAINE HCL (CARDIAC) PF 100 MG/5ML IV SOSY
PREFILLED_SYRINGE | INTRAVENOUS | Status: DC | PRN
  Administered 2023-07-30: 60 mg via INTRAVENOUS

## 2023-07-30 MED ORDER — IBUPROFEN 600 MG PO TABS: 600.0000 mg | ORAL_TABLET | Freq: Three times a day (TID) | ORAL | 0 refills | Status: DC | PRN

## 2023-07-30 MED ORDER — 0.9 % SODIUM CHLORIDE (POUR BTL) OPTIME
TOPICAL | Status: DC | PRN
  Administered 2023-07-30: 500 mL

## 2023-07-30 MED ORDER — SEVOFLURANE IN SOLN
RESPIRATORY_TRACT | Status: AC
  Filled 2023-07-30: qty 250

## 2023-07-30 MED ORDER — FENTANYL CITRATE (PF) 100 MCG/2ML IJ SOLN
INTRAMUSCULAR | Status: AC
  Filled 2023-07-30: qty 2

## 2023-07-30 MED ORDER — OXYCODONE HCL 5 MG PO TABS
ORAL_TABLET | ORAL | Status: AC
  Filled 2023-07-30: qty 1

## 2023-07-30 MED ORDER — ROCURONIUM BROMIDE 100 MG/10ML IV SOLN
INTRAVENOUS | Status: DC | PRN
  Administered 2023-07-30: 50 mg via INTRAVENOUS

## 2023-07-30 MED ORDER — PROPOFOL 10 MG/ML IV BOLUS
INTRAVENOUS | Status: AC
  Filled 2023-07-30: qty 20

## 2023-07-30 MED ORDER — DEXAMETHASONE SODIUM PHOSPHATE 10 MG/ML IJ SOLN
INTRAMUSCULAR | Status: AC
  Filled 2023-07-30: qty 1

## 2023-07-30 MED ORDER — ACETAMINOPHEN 500 MG PO TABS
ORAL_TABLET | ORAL | Status: AC
  Filled 2023-07-30: qty 2

## 2023-07-30 MED ORDER — LIDOCAINE HCL (PF) 2 % IJ SOLN
INTRAMUSCULAR | Status: AC
  Filled 2023-07-30: qty 5

## 2023-07-30 MED ORDER — PROPOFOL 10 MG/ML IV BOLUS
INTRAVENOUS | Status: DC | PRN
  Administered 2023-07-30: 120 mg via INTRAVENOUS

## 2023-07-30 MED ORDER — BUPIVACAINE LIPOSOME 1.3 % IJ SUSP
INTRAMUSCULAR | Status: AC
  Filled 2023-07-30: qty 10

## 2023-07-30 MED ORDER — GABAPENTIN 100 MG PO CAPS
ORAL_CAPSULE | ORAL | Status: AC
  Filled 2023-07-30: qty 2

## 2023-07-30 MED ORDER — MIDAZOLAM HCL 2 MG/2ML IJ SOLN
INTRAMUSCULAR | Status: DC | PRN
  Administered 2023-07-30: 2 mg via INTRAVENOUS

## 2023-07-30 MED ORDER — BUPIVACAINE-EPINEPHRINE (PF) 0.25% -1:200000 IJ SOLN
INTRAMUSCULAR | Status: DC | PRN
  Administered 2023-07-30: 30 mL

## 2023-07-30 MED ORDER — SUGAMMADEX SODIUM 200 MG/2ML IV SOLN
INTRAVENOUS | Status: AC
  Filled 2023-07-30: qty 2

## 2023-07-30 MED ORDER — BUPIVACAINE-EPINEPHRINE (PF) 0.25% -1:200000 IJ SOLN
INTRAMUSCULAR | Status: AC
  Filled 2023-07-30: qty 30

## 2023-07-30 MED ORDER — CEFAZOLIN SODIUM-DEXTROSE 2-4 GM/100ML-% IV SOLN
2.0000 g | INTRAVENOUS | Status: AC
  Administered 2023-07-30: 2 g via INTRAVENOUS

## 2023-07-30 SURGICAL SUPPLY — 43 items
BAG PRESSURE INF REUSE 1000 (BAG) IMPLANT
CANNULA CAP OBTURATR AIRSEAL 8 (CAP) IMPLANT
CAUTERY HOOK MNPLR 1.6 DVNC XI (INSTRUMENTS) ×1 IMPLANT
CLIP LIGATING HEMO O LOK GREEN (MISCELLANEOUS) ×1 IMPLANT
DERMABOND ADVANCED .7 DNX12 (GAUZE/BANDAGES/DRESSINGS) ×1 IMPLANT
DRAPE ARM DVNC X/XI (DISPOSABLE) ×4 IMPLANT
DRAPE COLUMN DVNC XI (DISPOSABLE) ×1 IMPLANT
DRSG TEGADERM 4X10 (GAUZE/BANDAGES/DRESSINGS) IMPLANT
ELECT CAUTERY BLADE TIP 2.5 (TIP) ×1 IMPLANT
ELECT REM PT RETURN 9FT ADLT (ELECTROSURGICAL) ×1 IMPLANT
ELECTRODE CAUTERY BLDE TIP 2.5 (TIP) ×1 IMPLANT
ELECTRODE REM PT RTRN 9FT ADLT (ELECTROSURGICAL) ×1 IMPLANT
FORCEPS BPLR R/ABLATION 8 DVNC (INSTRUMENTS) ×1 IMPLANT
FORCEPS PROGRASP DVNC XI (FORCEP) ×1 IMPLANT
GLOVE SURG SYN 7.0 (GLOVE) ×2 IMPLANT
GLOVE SURG SYN 7.0 PF PI (GLOVE) ×2 IMPLANT
GLOVE SURG SYN 7.5 E (GLOVE) ×2 IMPLANT
GLOVE SURG SYN 7.5 PF PI (GLOVE) ×2 IMPLANT
GOWN STRL REUS W/ TWL LRG LVL3 (GOWN DISPOSABLE) ×4 IMPLANT
IRRIGATOR SUCT 8 DISP DVNC XI (IRRIGATION / IRRIGATOR) IMPLANT
IV NS 1000ML BAXH (IV SOLUTION) IMPLANT
KIT PINK PAD W/HEAD ARE REST (MISCELLANEOUS) ×1 IMPLANT
KIT PINK PAD W/HEAD ARM REST (MISCELLANEOUS) ×1 IMPLANT
LABEL OR SOLS (LABEL) ×1 IMPLANT
MANIFOLD NEPTUNE II (INSTRUMENTS) ×1 IMPLANT
NDL HYPO 22X1.5 SAFETY MO (MISCELLANEOUS) ×1 IMPLANT
NEEDLE HYPO 22X1.5 SAFETY MO (MISCELLANEOUS) ×1 IMPLANT
NS IRRIG 500ML POUR BTL (IV SOLUTION) ×1 IMPLANT
OBTURATOR OPTICAL STND 8 DVNC (TROCAR) ×1 IMPLANT
OBTURATOR OPTICALSTD 8 DVNC (TROCAR) ×1 IMPLANT
PACK LAP CHOLECYSTECTOMY (MISCELLANEOUS) ×1 IMPLANT
SEAL UNIV 5-12 XI (MISCELLANEOUS) ×4 IMPLANT
SET TUBE FILTERED XL AIRSEAL (SET/KITS/TRAYS/PACK) IMPLANT
SET TUBE SMOKE EVAC HIGH FLOW (TUBING) ×1 IMPLANT
SOL ELECTROSURG ANTI STICK (MISCELLANEOUS) ×1 IMPLANT
SOLUTION ELECTROSURG ANTI STCK (MISCELLANEOUS) ×1 IMPLANT
SPIKE FLUID TRANSFER (MISCELLANEOUS) ×1 IMPLANT
SUT MNCRL AB 4-0 PS2 18 (SUTURE) ×1 IMPLANT
SUT VIC AB 3-0 SH 27X BRD (SUTURE) IMPLANT
SUT VICRYL 0 UR6 27IN ABS (SUTURE) ×2 IMPLANT
SYS BAG RETRIEVAL 10MM (BASKET) ×1 IMPLANT
SYSTEM BAG RETRIEVAL 10MM (BASKET) ×1 IMPLANT
WATER STERILE IRR 500ML POUR (IV SOLUTION) ×1 IMPLANT

## 2023-07-30 NOTE — Transfer of Care (Signed)
 Immediate Anesthesia Transfer of Care Note  Patient: Tiffany Richardson  Procedure(s) Performed: CHOLECYSTECTOMY, ROBOT-ASSISTED, LAPAROSCOPIC (Abdomen)  Patient Location: PACU  Anesthesia Type:General  Level of Consciousness: awake and alert   Airway & Oxygen Therapy: Patient Spontanous Breathing and Patient connected to face mask oxygen  Post-op Assessment: Report given to RN and Post -op Vital signs reviewed and stable  Post vital signs: Reviewed and stable  Last Vitals:  Vitals Value Taken Time  BP 127/64 07/30/23 0903  Temp    Pulse 78 07/30/23 0905  Resp 17 07/30/23 0905  SpO2 99 % 07/30/23 0905  Vitals shown include unfiled device data.  Last Pain:  Vitals:   07/30/23 0625  TempSrc: Temporal  PainSc: 4          Complications: No notable events documented.

## 2023-07-30 NOTE — Anesthesia Preprocedure Evaluation (Signed)
Anesthesia Evaluation  Patient identified by MRN, date of birth, ID band Patient awake    Reviewed: Allergy & Precautions, NPO status , Patient's Chart, lab work & pertinent test results  Airway Mallampati: III  TM Distance: >3 FB Neck ROM: full    Dental  (+) Chipped   Pulmonary COPD   Pulmonary exam normal        Cardiovascular hypertension, negative cardio ROS Normal cardiovascular exam     Neuro/Psych negative neurological ROS  negative psych ROS   GI/Hepatic negative GI ROS, Neg liver ROS,,,  Endo/Other  negative endocrine ROS    Renal/GU negative Renal ROS  negative genitourinary   Musculoskeletal   Abdominal   Peds  Hematology negative hematology ROS (+)   Anesthesia Other Findings Past Medical History: No date: Allergy No date: Cancer [facility])     Comment:  basal cell No date: Emphysema of lung (HCC) No date: Hypertension  Past Surgical History: No date: basal cell removed     Comment:  Rt eye No date: COLONOSCOPY No date: squamous cell removed   BMI    Body Mass Index: 24.82 kg/m      Reproductive/Obstetrics negative OB ROS                             Anesthesia Physical Anesthesia Plan  ASA: 3  Anesthesia Plan: General   Post-op Pain Management: Minimal or no pain anticipated   Induction: Intravenous  PONV Risk Score and Plan: 3 and Propofol infusion, TIVA and Ondansetron  Airway Management Planned: Nasal Cannula  Additional Equipment: None  Intra-op Plan:   Post-operative Plan:   Informed Consent: I have reviewed the patients History and Physical, chart, labs and discussed the procedure including the risks, benefits and alternatives for the proposed anesthesia with the patient or authorized representative who has indicated his/her understanding and acceptance.     Dental advisory given  Plan Discussed with: CRNA and Surgeon  Anesthesia Plan  Comments: (Discussed risks of anesthesia with patient, including possibility of difficulty with spontaneous ventilation under anesthesia necessitating airway intervention, PONV, and rare risks such as cardiac or respiratory or neurological events, and allergic reactions. Discussed the role of CRNA in patient's perioperative care. Patient understands.)       Anesthesia Quick Evaluation

## 2023-07-30 NOTE — Anesthesia Postprocedure Evaluation (Signed)
 Anesthesia Post Note  Patient: Tiffany Richardson  Procedure(s) Performed: CHOLECYSTECTOMY, ROBOT-ASSISTED, LAPAROSCOPIC (Abdomen)  Patient location during evaluation: PACU Anesthesia Type: General Level of consciousness: awake and alert Pain management: pain level controlled Vital Signs Assessment: post-procedure vital signs reviewed and stable Respiratory status: spontaneous breathing, nonlabored ventilation, respiratory function stable and patient connected to nasal cannula oxygen Cardiovascular status: blood pressure returned to baseline and stable Postop Assessment: no apparent nausea or vomiting Anesthetic complications: no  No notable events documented.   Last Vitals:  Vitals:   07/30/23 0915 07/30/23 0923  BP: 118/63   Pulse: 85 87  Resp: 20 20  Temp:    SpO2: 100% 95%    Last Pain:  Vitals:   07/30/23 0915  TempSrc:   PainSc: Asleep                 Stephanie Coup

## 2023-07-30 NOTE — Discharge Instructions (Addendum)
 Discharge Instructions: 1.  Patient may shower, but do not scrub wounds heavily and dab dry only. 2.  Do not submerge wounds in pool/tub until fully healed. 3.  Do not apply ointments or hydrogen peroxide to the wounds. 4.  May apply ice packs to the wounds for comfort. 5.  May resume your Aspirin on 08/01/23. 6.  Do not drive while taking narcotics for pain control.  Prior to driving, make sure you are able to rotate right and left to look at blindspots without significant pain or discomfort. 7.  No heavy lifting or pushing of more than 10-15 lbs for 4 weeks.   DO NOT REMOVE GREEN TEAL ARMBAND FOR 4 DAYS

## 2023-07-30 NOTE — Anesthesia Procedure Notes (Signed)
 Procedure Name: Intubation Date/Time: 07/30/2023 7:36 AM  Performed by: Monico Hoar, CRNAPre-anesthesia Checklist: Patient identified, Patient being monitored, Timeout performed, Emergency Drugs available and Suction available Patient Re-evaluated:Patient Re-evaluated prior to induction Oxygen Delivery Method: Circle system utilized Preoxygenation: Pre-oxygenation with 100% oxygen Induction Type: IV induction Ventilation: Mask ventilation without difficulty Laryngoscope Size: Mac and 3 Grade View: Grade II Tube type: Oral Tube size: 6.0 (attempted size 7 tube and wouldnt go through cords) mm Number of attempts: 1 Airway Equipment and Method: Stylet Placement Confirmation: ETT inserted through vocal cords under direct vision, positive ETCO2 and breath sounds checked- equal and bilateral Secured at: 21 cm Tube secured with: Tape Dental Injury: Teeth and Oropharynx as per pre-operative assessment

## 2023-07-30 NOTE — Op Note (Signed)
  Procedure Date:  07/30/2023  Pre-operative Diagnosis:  Symptomatic cholelithiasis  Post-operative Diagnosis: Symptomatic cholelithiasis  Procedure:  Robotic assisted cholecystectomy with ICG FireFly cholangiogram  Surgeon:  Howie Ill, MD  Anesthesia:  General endotracheal  Estimated Blood Loss:  5 ml  Specimens:  gallbladder  Complications:  None  Indications for Procedure:  This is a 78 y.o. female who presents with abdominal pain and workup revealing symptomatic cholelithiasis.  The benefits, complications, treatment options, and expected outcomes were discussed with the patient. The risks of bleeding, infection, recurrence of symptoms, failure to resolve symptoms, bile duct damage, bile duct leak, retained common bile duct stone, bowel injury, and need for further procedures were all discussed with the patient and she was willing to proceed.  Description of Procedure: The patient was correctly identified in the preoperative area and brought into the operating room.  The patient was placed supine with VTE prophylaxis in place.  Appropriate time-outs were performed.  Anesthesia was induced and the patient was intubated.  Appropriate antibiotics were infused.  The abdomen was prepped and draped in a sterile fashion. An infraumbilical incision was made. A cutdown technique was used to enter the abdominal cavity without injury, and a 12 mm robotic port was inserted.  Pneumoperitoneum was obtained with appropriate opening pressures.  Three 8-mm ports were placed in the mid abdomen at the level of the umbilicus under direct visualization.  The DaVinci platform was docked, camera targeted, and instruments were placed under direct visualization.  The gallbladder was identified.  The fundus was grasped and retracted cephalad.  Any adhesions were lysed bluntly and with electrocautery. The infundibulum was grasped and retracted laterally, exposing the peritoneum overlying the gallbladder.   This was incised with electrocautery and extended on either side of the gallbladder.  FireFly cholangiogram was then obtained, and we were able to clearly identify the cystic duct and common bile duct.  The cystic duct and cystic artery were carefully dissected with combination of cautery and blunt dissection.  Both were clipped twice proximally and once distally, cutting in between.  The gallbladder was taken from the gallbladder fossa in a retrograde fashion with electrocautery. The gallbladder was placed in an Endocatch bag. The liver bed was inspected and any bleeding was controlled with electrocautery. The right upper quadrant was then inspected again revealing intact clips, no bleeding, and no ductal injury.  The 8 mm ports were removed under direct visualization and the 12 mm port was removed.  The Endocatch bag was brought out via the umbilical incision. The fascial opening was closed using 0 vicryl suture.  Local anesthetic was infused in all incisions and the incisions were closed with 4-0 Monocryl.  The wounds were cleaned and sealed with DermaBond.  The patient was emerged from anesthesia and extubated and brought to the recovery room for further management.  The patient tolerated the procedure well and all counts were correct at the end of the case.   Howie Ill, MD

## 2023-07-30 NOTE — Interval H&P Note (Signed)
 History and Physical Interval Note:  07/30/2023 7:09 AM  Tiffany Richardson  has presented today for surgery, with the diagnosis of cholelithiasis.  The various methods of treatment have been discussed with the patient and family. After consideration of risks, benefits and other options for treatment, the patient has consented to  Procedure(s) with comments: CHOLECYSTECTOMY, ROBOT-ASSISTED, LAPAROSCOPIC (N/A) - with ICG as a surgical intervention.  The patient's history has been reviewed, patient examined, no change in status, stable for surgery.  I have reviewed the patient's chart and labs.  Questions were answered to the patient's satisfaction.     [name]

## 2023-07-31 LAB — SURGICAL PATHOLOGY

## 2023-08-11 ENCOUNTER — Ambulatory Visit: Admitting: Surgery

## 2023-08-13 ENCOUNTER — Encounter: Payer: Self-pay | Admitting: Surgery

## 2023-08-13 ENCOUNTER — Ambulatory Visit (INDEPENDENT_AMBULATORY_CARE_PROVIDER_SITE_OTHER): Admitting: Surgery

## 2023-08-13 VITALS — BP 150/85 | HR 85 | Temp 97.8°F | Ht 64.0 in | Wt 138.8 lb

## 2023-08-13 DIAGNOSIS — K8020 Calculus of gallbladder without cholecystitis without obstruction: Secondary | ICD-10-CM

## 2023-08-13 DIAGNOSIS — Z09 Encounter for follow-up examination after completed treatment for conditions other than malignant neoplasm: Secondary | ICD-10-CM

## 2023-08-13 NOTE — Progress Notes (Signed)
 08/13/2023  HPI: Tiffany Richardson is a 78 y.o. female s/p robotic cholecystectomy on 07/30/23.  Patient presents for follow up.  She reports some RUQ pain that appears to be constant, with some mild discomfort/bloatedness.  Denies any worsening pain.  Some foods give her diarrhea and some don't.  No troubles with the incisions themselves.  Vital signs: BP (!) 150/85   Pulse 85   Temp 97.8 F (36.6 C) (Oral)   Ht 5\' 4"  (1.626 m)   Wt 138 lb 12.8 oz (63 kg)   SpO2 97%   BMI 23.82 kg/m    Physical Exam: Constitutional:  No acute distress Abdomen: soft, non-distended, with mild discomfort in RUQ.  Negative Murphy's and non-peritoneal.  Incisions are healing well and are clean, dry, intact, without evidence of infection.  Assessment/Plan: This is a 78 y.o. female s/p robotic cholecystectomy  --Discussed with the patient that likely the discomfort is a combination of inflammation/scarring from the surgery itself as well as her body adjusting to not having a gallbladder.  This should improve as the healing process continues and her body adjusts better. --Reminded her of activity restrictions. --OK to drive, and advised to take frequent stops to rest and walk to prevent blood clots if driving long distances. --Follow up as needed, particularly if after another month, her symptoms have not improved.   Marene Shape, MD [facility]

## 2023-08-13 NOTE — Patient Instructions (Signed)

## 2023-08-19 ENCOUNTER — Ambulatory Visit: Payer: Self-pay | Admitting: Internal Medicine

## 2023-08-22 ENCOUNTER — Other Ambulatory Visit: Payer: Self-pay | Admitting: Oncology

## 2023-08-27 DIAGNOSIS — C44519 Basal cell carcinoma of skin of other part of trunk: Secondary | ICD-10-CM | POA: Diagnosis not present

## 2023-08-27 DIAGNOSIS — L570 Actinic keratosis: Secondary | ICD-10-CM | POA: Diagnosis not present

## 2023-08-27 DIAGNOSIS — X32XXXA Exposure to sunlight, initial encounter: Secondary | ICD-10-CM | POA: Diagnosis not present

## 2023-08-28 ENCOUNTER — Ambulatory Visit: Payer: PRIVATE HEALTH INSURANCE | Admitting: Oncology

## 2023-08-28 ENCOUNTER — Other Ambulatory Visit: Payer: PRIVATE HEALTH INSURANCE

## 2023-09-03 ENCOUNTER — Inpatient Hospital Stay (HOSPITAL_BASED_OUTPATIENT_CLINIC_OR_DEPARTMENT_OTHER): Payer: Self-pay | Admitting: Oncology

## 2023-09-03 ENCOUNTER — Inpatient Hospital Stay: Payer: Self-pay | Attending: Oncology

## 2023-09-03 ENCOUNTER — Inpatient Hospital Stay

## 2023-09-03 VITALS — BP 141/78 | HR 75 | Temp 98.0°F | Wt 142.6 lb

## 2023-09-03 DIAGNOSIS — M898X9 Other specified disorders of bone, unspecified site: Secondary | ICD-10-CM

## 2023-09-03 DIAGNOSIS — D473 Essential (hemorrhagic) thrombocythemia: Secondary | ICD-10-CM

## 2023-09-03 DIAGNOSIS — Z7964 Long term (current) use of myelosuppressive agent: Secondary | ICD-10-CM | POA: Insufficient documentation

## 2023-09-03 LAB — CMP (CANCER CENTER ONLY)
ALT: 44 U/L (ref 0–44)
AST: 48 U/L — ABNORMAL HIGH (ref 15–41)
Albumin: 3.5 g/dL (ref 3.5–5.0)
Alkaline Phosphatase: 125 U/L (ref 38–126)
Anion gap: 6 (ref 5–15)
BUN: 15 mg/dL (ref 8–23)
CO2: 26 mmol/L (ref 22–32)
Calcium: 8.6 mg/dL — ABNORMAL LOW (ref 8.9–10.3)
Chloride: 105 mmol/L (ref 98–111)
Creatinine: 0.94 mg/dL (ref 0.44–1.00)
GFR, Estimated: >60 mL/min (ref >60)
Glucose, Bld: 95 mg/dL (ref 70–99)
Potassium: 4.7 mmol/L (ref 3.5–5.1)
Sodium: 137 mmol/L (ref 135–145)
Total Bilirubin: 0.8 mg/dL (ref 0.0–1.2)
Total Protein: 6.6 g/dL (ref 6.5–8.1)

## 2023-09-03 LAB — CBC WITH DIFFERENTIAL (CANCER CENTER ONLY)
Abs Immature Granulocytes: 0.02 10*3/uL (ref 0.00–0.07)
Basophils Absolute: 0.1 10*3/uL (ref 0.0–0.1)
Basophils Relative: 1 %
Eosinophils Absolute: 0.1 10*3/uL (ref 0.0–0.5)
Eosinophils Relative: 3 %
HCT: 37.6 % (ref 36.0–46.0)
Hemoglobin: 13.1 g/dL (ref 12.0–15.0)
Immature Granulocytes: 0 %
Lymphocytes Relative: 39 %
Lymphs Abs: 2 10*3/uL (ref 0.7–4.0)
MCH: 40.1 pg — ABNORMAL HIGH (ref 26.0–34.0)
MCHC: 34.8 g/dL (ref 30.0–36.0)
MCV: 115 fL — ABNORMAL HIGH (ref 80.0–100.0)
Monocytes Absolute: 0.3 10*3/uL (ref 0.1–1.0)
Monocytes Relative: 7 %
Neutro Abs: 2.5 10*3/uL (ref 1.7–7.7)
Neutrophils Relative %: 50 %
Platelet Count: 398 10*3/uL (ref 150–400)
RBC: 3.27 MIL/uL — ABNORMAL LOW (ref 3.87–5.11)
RDW: 13.4 % (ref 11.5–15.5)
WBC Count: 5 10*3/uL (ref 4.0–10.5)
nRBC: 0 % (ref 0.0–0.2)

## 2023-09-03 LAB — LACTATE DEHYDROGENASE: LDH: 175 U/L (ref 98–192)

## 2023-09-03 NOTE — Progress Notes (Signed)
 Hematology/Oncology Progress note [phone] [phone]      Patient Care Team: Carollynn Cirri, NP as PCP - General (Internal Medicine) Timmy Forbes, MD as Consulting Physician (Oncology)  ASSESSMENT & PLAN:   Essential thrombocythemia [facility]) Labs reviewed and discussed with patient Stable platelet counts.  Continue hydroxyurea  to 1000mg  on 2 days per week, and 500mg  daily the rest of the days.   Lytic bone lesions on xray Lucent bone lesions, differential diagnosis include cyst, multiple myeloma, metastasis from solid tumors etc. Check multiple myeloma panel, light chain ratio, beta-2 glycoprotein, LDH. Obtain PET scan for evaluation.  Hypocalcemia Calcium  is chronically decreased.  Stable. Recommend patient to continue calcium  supplementation.   Orders Placed This Encounter  Procedures   NM PET Image Initial (PI) Whole Body    Standing Status:   Future    Expected Date:   09/10/2023    Expiration Date:   09/02/2024    If indicated for the ordered procedure, I authorize the administration of a radiopharmaceutical per Radiology protocol:   Yes    Preferred imaging location?:   [facility]   Multiple Myeloma Panel (SPEP&IFE w/QIG)    Standing Status:   Future    Number of Occurrences:   1    Expected Date:   09/03/2023    Expiration Date:   09/02/2024   Kappa/lambda light chains    Standing Status:   Future    Number of Occurrences:   1    Expected Date:   09/03/2023    Expiration Date:   09/02/2024   Beta 2 microglobulin, serum    Standing Status:   Future    Number of Occurrences:   1    Expected Date:   09/03/2023    Expiration Date:   09/02/2024   Lactate dehydrogenase    Standing Status:   Future    Number of Occurrences:   1    Expected Date:   09/03/2023    Expiration Date:   09/02/2024    Follow up  after PET All questions were answered. The patient knows to call the clinic with any problems, questions or concerns.  Timmy Forbes, MD,  PhD [facility] Hematology Oncology 09/03/2023    CHIEF COMPLAINTS/REASON FOR VISIT:  Essential thrombocythemia  HISTORY OF PRESENTING ILLNESS:  Tiffany Richardson is a 78 y.o. female who was seen in consultation at the request of Baity, Rankin Buzzard, NP for evaluation of thromcbocytosis Reviewed patient's labs.  08/07/2020 labs showed elevated platelet counts at 904,000.  Normal wbc  and hemoglobin   Reviewed patient's previous labs. Thrombocytosis onset is chronic onset , duration since at least 2017 No aggravating or elevated factors. Associated symptoms or signs:  Denies weight loss, fever, chills, fatigue, night sweats.  Context:  Smoking history: Denies Family history of polycythemia.  Denies.  However his father had a history of needing frequent blood removal History of iron deficiency anemia: Denies History of DVT: Denies She also reports chronic abdominal bloating.  She has IBS.  Denies any unintentional weight loss, fever, nausea vomiting.  No recent colonoscopy in current EMR. She is widowed, husband had colon cancer.  # Jak 2 V61F mutation positive 08/21/2020 bone marrow biopsy results showed hypercellular bone marrow for age with features of myeloproliferative neoplasm.  Consistent with essential thrombocythemia, iron deficient polycythemia vera, pretty fibrotic/early primary myelofibrosis. Patient has no erythrocytosis.  No chronic leukocytosis.  Clinically most consistent with essential thrombocythemia.  # Chronic bloating and abdominal discomfort,  possible due to chronic IBS symptoms.  Last colonoscopy 2013. Patient was seen by Dr. Felicita Horns in 2019 and was recommended to increase soluble fiber, MiraLAX, if not helpful trial of FODMAP diet and probiotics.  Discussed with patient that I recommend surveillance colonoscopy.  07/06/2021, CT scan showed constipation.  Otherwise no acute intra-a patient bdominal or intrapelvic abnormality. Reports alternate of constipation and diarrhea.   Not associated with any food or medication.  INTERVAL HISTORY Tiffany Richardson is a 78 y.o. female who has above history reviewed by me today presents for follow up visit for management of essential thrombocythemia Patient has been on  hydroxyurea  to 1000mg  2 day per week, and 500mg  daily the rest of the days.   She had syncope in March 2025 and was admitted for evaluation.  She also had nausea vomiting which was felt to be due to cholelithiasis without cholecystitis..  She had a negative CT scan without contrast. 06/27/2023, CT cervical spine without contrast showed no CT evidence of acute osseous abnormality.  Multiple small lucent lesions within the vertebral bodies, correlate for any history of marrow disease such as myeloma.  Patient underwent elective outpatient cholecystitis.   Review of Systems  Constitutional:  Negative for appetite change, chills, fatigue and fever.  HENT:   Negative for hearing loss and voice change.   Eyes:  Negative for eye problems.  Respiratory:  Negative for chest tightness and cough.   Cardiovascular:  Negative for chest pain.  Gastrointestinal:  Negative for abdominal distention, abdominal pain and blood in stool.  Endocrine: Negative for hot flashes.  Genitourinary:  Negative for difficulty urinating and frequency.   Musculoskeletal:  Negative for arthralgias.  Skin:  Negative for itching and rash.  Neurological:  Negative for extremity weakness.  Hematological:  Negative for adenopathy.  Psychiatric/Behavioral:  Negative for confusion.     MEDICAL HISTORY:  Past Medical History:  Diagnosis Date   Allergy    Codiene   Anemia    Arthritis    Cancer (HCC)    basal cell   Emphysema of lung (HCC)    GERD (gastroesophageal reflux disease)    Hyperlipidemia    Hypertension    Symptomatic cholelithiasis 2025    SURGICAL HISTORY: Past Surgical History:  Procedure Laterality Date   basal cell removed     Rt eye   BIOPSY  12/19/2022   Procedure:  BIOPSY;  Surgeon: Quintin Buckle, DO;  Location: [facility] ENDOSCOPY;  Service: Gastroenterology;;   COLONOSCOPY     COLONOSCOPY WITH PROPOFOL  N/A 12/19/2022   Procedure: COLONOSCOPY WITH PROPOFOL ;  Surgeon: Quintin Buckle, DO;  Location: [facility] ENDOSCOPY;  Service: Gastroenterology;  Laterality: N/A;   ESOPHAGOGASTRODUODENOSCOPY (EGD) WITH PROPOFOL  N/A 12/19/2022   Procedure: ESOPHAGOGASTRODUODENOSCOPY (EGD) WITH PROPOFOL ;  Surgeon: Quintin Buckle, DO;  Location: [facility] ENDOSCOPY;  Service: Gastroenterology;  Laterality: N/A;   POLYPECTOMY  12/19/2022   Procedure: POLYPECTOMY;  Surgeon: Quintin Buckle, DO;  Location: [facility] ENDOSCOPY;  Service: Gastroenterology;;   squamous cell removed       SOCIAL HISTORY: Social History   Socioeconomic History   Marital status: Widowed    Spouse name: Not on file   Number of children: Not on file   Years of education: Not on file   Highest education level: 12th grade  Occupational History   Not on file  Tobacco Use   Smoking status: Never   Smokeless tobacco: Never  Vaping Use   Vaping status: Never Used  Substance and  Sexual Activity   Alcohol use: No   Drug use: No   Sexual activity: Not Currently  Other Topics Concern   Not on file  Social History Narrative   Lives with family   Social Drivers of Health   Financial Resource Strain: Low Risk  (05/22/2023)   Overall Financial Resource Strain (CARDIA)    Difficulty of Paying Living Expenses: Not hard at all  Food Insecurity: No Food Insecurity (06/28/2023)   Hunger Vital Sign    Worried About Running Out of Food in the Last Year: Never true    Ran Out of Food in the Last Year: Never true  Transportation Needs: No Transportation Needs (06/28/2023)   PRAPARE - Administrator, Civil Service (Medical): No    Lack of Transportation (Non-Medical): No  Physical Activity: Insufficiently Active (05/22/2023)   Exercise Vital Sign    Days of Exercise per Week: 4 days     Minutes of Exercise per Session: 30 min  Stress: No Stress Concern Present (05/22/2023)   [employer] of Occupational Health - Occupational Stress Questionnaire    Feeling of Stress : Only a little  Social Connections: Moderately Integrated (06/28/2023)   Social Connection and Isolation Panel [NHANES]    Frequency of Communication with Friends and Family: More than three times a week    Frequency of Social Gatherings with Friends and Family: Twice a week    Attends Religious Services: More than 4 times per year    Active Member of [employer] or Organizations: Yes    Attends Banker Meetings: More than 4 times per year    Marital Status: Widowed  Intimate Partner Violence: Not At Risk (06/28/2023)   Humiliation, Afraid, Rape, and Kick questionnaire    Fear of Current or Ex-Partner: No    Emotionally Abused: No    Physically Abused: No    Sexually Abused: No    FAMILY HISTORY: Family History  Problem Relation Age of Onset   Leukemia Mother    Heart disease Father    Non-Hodgkin's lymphoma Daughter     ALLERGIES:  is allergic to codeine.  MEDICATIONS:  Current Outpatient Medications  Medication Sig Dispense Refill   aspirin  EC 81 MG tablet Take 81 mg by mouth.     atorvastatin  (LIPITOR) 40 MG tablet TAKE 1 TABLET BY MOUTH EVERY DAY 90 tablet 1   fluticasone  (FLONASE ) 50 MCG/ACT nasal spray SPRAY 2 SPRAYS INTO EACH NOSTRIL EVERY DAY 48 mL 0   hydroxyurea  (HYDREA ) 500 MG capsule Take 1 capsule (500 mg total) by mouth See admin instructions. Take 1000mg  on Wednesdays and Sundays, and take 500mg  daily for the rest of the week. 94 capsule 2   losartan  (COZAAR ) 25 MG tablet TAKE 1 TABLET (25 MG TOTAL) BY MOUTH DAILY. 90 tablet 0   metoprolol  succinate (TOPROL -XL) 50 MG 24 hr tablet TAKE 1 TABLET BY MOUTH EVERY DAY WITH OR IMMEDIATELY FOLLOWING A MEAL 90 tablet 1   sucralfate (CARAFATE) 1 g tablet Take 1 g by mouth 2 (two) times daily. Before meals     No current  facility-administered medications for this visit.     PHYSICAL EXAMINATION: ECOG PERFORMANCE STATUS: 0 - Asymptomatic Vitals:   09/03/23 1411  BP: (!) 141/78  Pulse: 75  Temp: 98 F (36.7 C)  SpO2: 100%   Filed Weights   09/03/23 1411  Weight: 142 lb 9.6 oz (64.7 kg)    Physical Exam Constitutional:      General:  She is not in acute distress. HENT:     Head: Normocephalic.  Eyes:     General: No scleral icterus. Cardiovascular:     Rate and Rhythm: Normal rate.  Pulmonary:     Effort: Pulmonary effort is normal. No respiratory distress.  Abdominal:     General: There is no distension.  Musculoskeletal:        General: No deformity. Normal range of motion.     Cervical back: Normal range of motion.  Skin:    General: Skin is warm and dry.     Findings: No erythema or rash.  Neurological:     Mental Status: She is alert and oriented to person, place, and time. Mental status is at baseline.     Cranial Nerves: No cranial nerve deficit.     Coordination: Coordination normal.  Psychiatric:        Mood and Affect: Mood normal.      LABORATORY DATA:  I have reviewed the data as listed    Latest Ref Rng & Units 09/03/2023    1:34 PM 06/30/2023    5:12 AM 06/28/2023    5:11 AM  CBC  WBC 4.0 - 10.5 K/uL 5.0  6.5  8.7   Hemoglobin 12.0 - 15.0 g/dL 08.6  57.8  46.9   Hematocrit 36.0 - 46.0 % 37.6  33.4  33.9   Platelets 150 - 400 K/uL 398  399  439       Latest Ref Rng & Units 09/03/2023    1:35 PM 06/30/2023    5:12 AM 06/28/2023    5:11 AM  CMP  Glucose 70 - 99 mg/dL 95  629  528   BUN 8 - 23 mg/dL 15  11  13    Creatinine 0.44 - 1.00 mg/dL 4.13  2.44  0.10   Sodium 135 - 145 mmol/L 137  142  140   Potassium 3.5 - 5.1 mmol/L 4.7  4.0  4.1   Chloride 98 - 111 mmol/L 105  106  107   CO2 22 - 32 mmol/L 26  27  24    Calcium  8.9 - 10.3 mg/dL 8.6  8.7  8.3   Total Protein 6.5 - 8.1 g/dL 6.6     Total Bilirubin 0.0 - 1.2 mg/dL 0.8     Alkaline Phos 38 - 126 U/L 125      AST 15 - 41 U/L 48     ALT 0 - 44 U/L 44      Lab Results  Component Value Date   IRON 85 08/15/2020   TIBC 337 08/15/2020   FERRITIN 31 08/15/2020       RADIOGRAPHIC STUDIES: I have personally reviewed the radiological images as listed and agreed with the findings in the report. No results found.

## 2023-09-03 NOTE — Assessment & Plan Note (Signed)
 Labs reviewed and discussed with patient Stable platelet counts.  Continue hydroxyurea to 1000mg  on 2 days per week, and 500mg  daily the rest of the days.

## 2023-09-03 NOTE — Assessment & Plan Note (Signed)
 Lucent bone lesions, differential diagnosis include cyst, multiple myeloma, metastasis from solid tumors etc. Check multiple myeloma panel, light chain ratio, beta-2 glycoprotein, LDH. Obtain PET scan for evaluation.

## 2023-09-03 NOTE — Assessment & Plan Note (Signed)
Calcium is chronically decreased.  Stable. Recommend patient to continue calcium supplementation.

## 2023-09-04 LAB — BETA 2 MICROGLOBULIN, SERUM: Beta-2 Microglobulin: 2.3 mg/L (ref 0.6–2.4)

## 2023-09-04 LAB — KAPPA/LAMBDA LIGHT CHAINS
Kappa free light chain: 17.4 mg/L (ref 3.3–19.4)
Kappa, lambda light chain ratio: 1.2 (ref 0.26–1.65)
Lambda free light chains: 14.5 mg/L (ref 5.7–26.3)

## 2023-09-08 LAB — MULTIPLE MYELOMA PANEL, SERUM
Albumin SerPl Elph-Mcnc: 3.6 g/dL (ref 2.9–4.4)
Albumin/Glob SerPl: 1.3 (ref 0.7–1.7)
Alpha 1: 0.2 g/dL (ref 0.0–0.4)
Alpha2 Glob SerPl Elph-Mcnc: 0.8 g/dL (ref 0.4–1.0)
B-Globulin SerPl Elph-Mcnc: 1.3 g/dL (ref 0.7–1.3)
Gamma Glob SerPl Elph-Mcnc: 0.7 g/dL (ref 0.4–1.8)
Globulin, Total: 3 g/dL (ref 2.2–3.9)
IgA: 487 mg/dL — ABNORMAL HIGH (ref 64–422)
IgG (Immunoglobin G), Serum: 854 mg/dL (ref 586–1602)
IgM (Immunoglobulin M), Srm: 42 mg/dL (ref 26–217)
Total Protein ELP: 6.6 g/dL (ref 6.0–8.5)

## 2023-09-10 ENCOUNTER — Ambulatory Visit
Admission: RE | Admit: 2023-09-10 | Discharge: 2023-09-10 | Disposition: A | Discharge status: Home / Self Care | Source: Ambulatory Visit | Attending: Oncology

## 2023-09-10 DIAGNOSIS — K8689 Other specified diseases of pancreas: Secondary | ICD-10-CM | POA: Diagnosis not present

## 2023-09-10 DIAGNOSIS — M25462 Effusion, left knee: Secondary | ICD-10-CM | POA: Diagnosis not present

## 2023-09-10 DIAGNOSIS — K449 Diaphragmatic hernia without obstruction or gangrene: Secondary | ICD-10-CM | POA: Insufficient documentation

## 2023-09-10 DIAGNOSIS — M898X9 Other specified disorders of bone, unspecified site: Secondary | ICD-10-CM | POA: Insufficient documentation

## 2023-09-10 LAB — GLUCOSE, CAPILLARY: Glucose-Capillary: 104 mg/dL — ABNORMAL HIGH (ref 70–99)

## 2023-09-10 MED ORDER — FLUDEOXYGLUCOSE F - 18 (FDG) INJECTION
7.4000 | Freq: Once | INTRAVENOUS | Status: AC | PRN
  Administered 2023-09-10: 7.78 via INTRAVENOUS

## 2023-09-23 ENCOUNTER — Inpatient Hospital Stay: Attending: Oncology | Admitting: Oncology

## 2023-09-23 ENCOUNTER — Encounter: Payer: Self-pay | Admitting: Oncology

## 2023-09-23 VITALS — BP 126/76 | HR 99 | Temp 97.6°F | Resp 14 | Wt 139.7 lb

## 2023-09-23 DIAGNOSIS — M898X9 Other specified disorders of bone, unspecified site: Secondary | ICD-10-CM | POA: Diagnosis not present

## 2023-09-23 DIAGNOSIS — Z79899 Other long term (current) drug therapy: Secondary | ICD-10-CM | POA: Insufficient documentation

## 2023-09-23 DIAGNOSIS — M899 Disorder of bone, unspecified: Secondary | ICD-10-CM | POA: Insufficient documentation

## 2023-09-23 DIAGNOSIS — D473 Essential (hemorrhagic) thrombocythemia: Secondary | ICD-10-CM | POA: Diagnosis not present

## 2023-09-23 NOTE — Assessment & Plan Note (Signed)
 Lucent bone lesions, differential diagnosis include cyst, multiple myeloma, metastasis from solid tumors etc. No M protein multiple myeloma panel, polyclonal suggests chronic inflammation.  normal light chain ratio, normal beta-2 glycoprotein, normal LDH. Lesions are not hypermetabolic on PET scan, suggesting non aggressive or benign process.  I will hold off additional work up  Anorectal hypermetabolic activity, possible physiological. She has hemorrhoids. Recommend her to make a follow up with GI for direct visualization.

## 2023-09-23 NOTE — Assessment & Plan Note (Signed)
Calcium is chronically decreased.  Stable. Recommend patient to continue calcium supplementation.

## 2023-09-23 NOTE — Assessment & Plan Note (Signed)
 Labs reviewed and discussed with patient Stable platelet counts.  Continue hydroxyurea to 1000mg  on 2 days per week, and 500mg  daily the rest of the days.

## 2023-09-23 NOTE — Progress Notes (Signed)
 Pt in for follow up and MRI results.  Anxious for results.

## 2023-09-23 NOTE — Progress Notes (Signed)
 Hematology/Oncology Progress note [phone] [phone]      Patient Care Team: Carollynn Cirri, NP as PCP - General (Internal Medicine) Timmy Forbes, MD as Consulting Physician (Oncology)  ASSESSMENT & PLAN:   Essential thrombocythemia [facility]) Labs reviewed and discussed with patient Stable platelet counts.  Continue hydroxyurea  to 1000mg  on 2 days per week, and 500mg  daily the rest of the days.   Hypocalcemia Calcium  is chronically decreased.  Stable. Recommend patient to continue calcium  supplementation.  Lytic bone lesions on xray Lucent bone lesions, differential diagnosis include cyst, multiple myeloma, metastasis from solid tumors etc. No M protein multiple myeloma panel, polyclonal suggests chronic inflammation.  normal light chain ratio, normal beta-2 glycoprotein, normal LDH. Lesions are not hypermetabolic on PET scan, suggesting non aggressive or benign process.  I will hold off additional work up  Anorectal hypermetabolic activity, possible physiological. She has hemorrhoids. Recommend her to make a follow up with GI for direct visualization.    Orders Placed This Encounter  Procedures   CBC with Differential (Cancer Center Only)    Standing Status:   Future    Expected Date:   12/24/2023    Expiration Date:   09/22/2024   CMP (Cancer Center only)    Standing Status:   Future    Expected Date:   12/24/2023    Expiration Date:   09/22/2024    Follow up  after PET All questions were answered. The patient knows to call the clinic with any problems, questions or concerns.  Timmy Forbes, MD, PhD [facility] Hematology Oncology 09/23/2023    CHIEF COMPLAINTS/REASON FOR VISIT:  Essential thrombocythemia  HISTORY OF PRESENTING ILLNESS:  Tiffany Richardson is a 78 y.o. female who was seen in consultation at the request of Baity, Rankin Buzzard, NP for evaluation of thromcbocytosis Reviewed patient's labs.  08/07/2020 labs showed elevated platelet counts at 904,000.   Normal wbc  and hemoglobin   Reviewed patient's previous labs. Thrombocytosis onset is chronic onset , duration since at least 2017 No aggravating or elevated factors. Associated symptoms or signs:  Denies weight loss, fever, chills, fatigue, night sweats.  Context:  Smoking history: Denies Family history of polycythemia.  Denies.  However his father had a history of needing frequent blood removal History of iron deficiency anemia: Denies History of DVT: Denies She also reports chronic abdominal bloating.  She has IBS.  Denies any unintentional weight loss, fever, nausea vomiting.  No recent colonoscopy in current EMR. She is widowed, husband had colon cancer.  # Jak 2 V61F mutation positive 08/21/2020 bone marrow biopsy results showed hypercellular bone marrow for age with features of myeloproliferative neoplasm.  Consistent with essential thrombocythemia, iron deficient polycythemia vera, pretty fibrotic/early primary myelofibrosis. Patient has no erythrocytosis.  No chronic leukocytosis.  Clinically most consistent with essential thrombocythemia.  # Chronic bloating and abdominal discomfort, possible due to chronic IBS symptoms.  Last colonoscopy 2013. Patient was seen by Dr. Felicita Horns in 2019 and was recommended to increase soluble fiber, MiraLAX, if not helpful trial of FODMAP diet and probiotics.  Discussed with patient that I recommend surveillance colonoscopy.  07/06/2021, CT scan showed constipation.  Otherwise no acute intra-a patient bdominal or intrapelvic abnormality. Reports alternate of constipation and diarrhea.  Not associated with any food or medication.  She had syncope in March 2025 and was admitted for evaluation.  She also had nausea vomiting which was felt to be due to cholelithiasis without cholecystitis..  She had a negative CT scan  without contrast. 06/27/2023, CT cervical spine without contrast showed no CT evidence of acute osseous abnormality.  Multiple small lucent  lesions within the vertebral bodies, correlate for any history of marrow disease such as myeloma.   INTERVAL HISTORY Tiffany Richardson is a 78 y.o. female who has above history reviewed by me today presents for follow up visit for management of essential thrombocythemia Patient has been on  hydroxyurea  to 1000mg  2 day per week, and 500mg  daily the rest of the days.  She has had PET scan done and presents to discuss results.     Review of Systems  Constitutional:  Negative for appetite change, chills, fatigue and fever.  HENT:   Negative for hearing loss and voice change.   Eyes:  Negative for eye problems.  Respiratory:  Negative for chest tightness and cough.   Cardiovascular:  Negative for chest pain.  Gastrointestinal:  Negative for abdominal distention, abdominal pain and blood in stool.  Endocrine: Negative for hot flashes.  Genitourinary:  Negative for difficulty urinating and frequency.   Musculoskeletal:  Negative for arthralgias.  Skin:  Negative for itching and rash.  Neurological:  Negative for extremity weakness.  Hematological:  Negative for adenopathy.  Psychiatric/Behavioral:  Negative for confusion.     MEDICAL HISTORY:  Past Medical History:  Diagnosis Date   Allergy    Codiene   Anemia    Arthritis    Cancer (HCC)    basal cell   Emphysema of lung (HCC)    GERD (gastroesophageal reflux disease)    Hyperlipidemia    Hypertension    Symptomatic cholelithiasis 2025    SURGICAL HISTORY: Past Surgical History:  Procedure Laterality Date   basal cell removed     Rt eye   BIOPSY  12/19/2022   Procedure: BIOPSY;  Surgeon: Quintin Buckle, DO;  Location: [facility] ENDOSCOPY;  Service: Gastroenterology;;   COLONOSCOPY     COLONOSCOPY WITH PROPOFOL  N/A 12/19/2022   Procedure: COLONOSCOPY WITH PROPOFOL ;  Surgeon: Quintin Buckle, DO;  Location: [facility] ENDOSCOPY;  Service: Gastroenterology;  Laterality: N/A;   ESOPHAGOGASTRODUODENOSCOPY (EGD) WITH PROPOFOL  N/A  12/19/2022   Procedure: ESOPHAGOGASTRODUODENOSCOPY (EGD) WITH PROPOFOL ;  Surgeon: Quintin Buckle, DO;  Location: [facility] ENDOSCOPY;  Service: Gastroenterology;  Laterality: N/A;   POLYPECTOMY  12/19/2022   Procedure: POLYPECTOMY;  Surgeon: Quintin Buckle, DO;  Location: [facility] ENDOSCOPY;  Service: Gastroenterology;;   squamous cell removed       SOCIAL HISTORY: Social History   Socioeconomic History   Marital status: Widowed    Spouse name: Not on file   Number of children: Not on file   Years of education: Not on file   Highest education level: 12th grade  Occupational History   Not on file  Tobacco Use   Smoking status: Never   Smokeless tobacco: Never  Vaping Use   Vaping status: Never Used  Substance and Sexual Activity   Alcohol use: No   Drug use: No   Sexual activity: Not Currently  Other Topics Concern   Not on file  Social History Narrative   Lives with family   Social Drivers of Health   Financial Resource Strain: Low Risk  (05/22/2023)   Overall Financial Resource Strain (CARDIA)    Difficulty of Paying Living Expenses: Not hard at all  Food Insecurity: No Food Insecurity (06/28/2023)   Hunger Vital Sign    Worried About Running Out of Food in the Last Year: Never true    Ran  Out of Food in the Last Year: Never true  Transportation Needs: No Transportation Needs (06/28/2023)   PRAPARE - Administrator, Civil Service (Medical): No    Lack of Transportation (Non-Medical): No  Physical Activity: Insufficiently Active (05/22/2023)   Exercise Vital Sign    Days of Exercise per Week: 4 days    Minutes of Exercise per Session: 30 min  Stress: No Stress Concern Present (05/22/2023)   [employer] of Occupational Health - Occupational Stress Questionnaire    Feeling of Stress : Only a little  Social Connections: Moderately Integrated (06/28/2023)   Social Connection and Isolation Panel [NHANES]    Frequency of Communication with Friends and  Family: More than three times a week    Frequency of Social Gatherings with Friends and Family: Twice a week    Attends Religious Services: More than 4 times per year    Active Member of [employer] or Organizations: Yes    Attends Banker Meetings: More than 4 times per year    Marital Status: Widowed  Intimate Partner Violence: Not At Risk (06/28/2023)   Humiliation, Afraid, Rape, and Kick questionnaire    Fear of Current or Ex-Partner: No    Emotionally Abused: No    Physically Abused: No    Sexually Abused: No    FAMILY HISTORY: Family History  Problem Relation Age of Onset   Leukemia Mother    Heart disease Father    Non-Hodgkin's lymphoma Daughter     ALLERGIES:  is allergic to codeine.  MEDICATIONS:  Current Outpatient Medications  Medication Sig Dispense Refill   aspirin  EC 81 MG tablet Take 81 mg by mouth.     atorvastatin  (LIPITOR) 40 MG tablet TAKE 1 TABLET BY MOUTH EVERY DAY 90 tablet 1   fluticasone  (FLONASE ) 50 MCG/ACT nasal spray SPRAY 2 SPRAYS INTO EACH NOSTRIL EVERY DAY 48 mL 0   hydroxyurea  (HYDREA ) 500 MG capsule Take 1 capsule (500 mg total) by mouth See admin instructions. Take 1000mg  on Wednesdays and Sundays, and take 500mg  daily for the rest of the week. 94 capsule 2   losartan  (COZAAR ) 25 MG tablet TAKE 1 TABLET (25 MG TOTAL) BY MOUTH DAILY. 90 tablet 0   metoprolol  succinate (TOPROL -XL) 50 MG 24 hr tablet TAKE 1 TABLET BY MOUTH EVERY DAY WITH OR IMMEDIATELY FOLLOWING A MEAL 90 tablet 1   sucralfate (CARAFATE) 1 g tablet Take 1 g by mouth 2 (two) times daily. Before meals     No current facility-administered medications for this visit.     PHYSICAL EXAMINATION: ECOG PERFORMANCE STATUS: 0 - Asymptomatic Vitals:   09/23/23 1105  BP: 126/76  Pulse: 99  Resp: 14  Temp: 97.6 F (36.4 C)  SpO2: 100%   Filed Weights   09/23/23 1105  Weight: 139 lb 11.2 oz (63.4 kg)    Physical Exam Constitutional:      General: She is not in acute  distress. HENT:     Head: Normocephalic.  Eyes:     General: No scleral icterus. Cardiovascular:     Rate and Rhythm: Normal rate.  Pulmonary:     Effort: Pulmonary effort is normal. No respiratory distress.  Abdominal:     General: There is no distension.  Musculoskeletal:        General: No deformity. Normal range of motion.     Cervical back: Normal range of motion.  Skin:    General: Skin is warm and dry.  Findings: No erythema or rash.  Neurological:     Mental Status: She is alert and oriented to person, place, and time. Mental status is at baseline.     Cranial Nerves: No cranial nerve deficit.     Coordination: Coordination normal.  Psychiatric:        Mood and Affect: Mood normal.      LABORATORY DATA:  I have reviewed the data as listed    Latest Ref Rng & Units 09/03/2023    1:34 PM 06/30/2023    5:12 AM 06/28/2023    5:11 AM  CBC  WBC 4.0 - 10.5 K/uL 5.0  6.5  8.7   Hemoglobin 12.0 - 15.0 g/dL 82.9  56.2  13.0   Hematocrit 36.0 - 46.0 % 37.6  33.4  33.9   Platelets 150 - 400 K/uL 398  399  439       Latest Ref Rng & Units 09/03/2023    1:35 PM 06/30/2023    5:12 AM 06/28/2023    5:11 AM  CMP  Glucose 70 - 99 mg/dL 95  865  784   BUN 8 - 23 mg/dL 15  11  13    Creatinine 0.44 - 1.00 mg/dL 6.96  2.95  2.84   Sodium 135 - 145 mmol/L 137  142  140   Potassium 3.5 - 5.1 mmol/L 4.7  4.0  4.1   Chloride 98 - 111 mmol/L 105  106  107   CO2 22 - 32 mmol/L 26  27  24    Calcium  8.9 - 10.3 mg/dL 8.6  8.7  8.3   Total Protein 6.5 - 8.1 g/dL 6.6     Total Bilirubin 0.0 - 1.2 mg/dL 0.8     Alkaline Phos 38 - 126 U/L 125     AST 15 - 41 U/L 48     ALT 0 - 44 U/L 44      Lab Results  Component Value Date   IRON 85 08/15/2020   TIBC 337 08/15/2020   FERRITIN 31 08/15/2020       RADIOGRAPHIC STUDIES: I have personally reviewed the radiological images as listed and agreed with the findings in the report. NM PET Image Initial (PI) Whole Body Result Date:  09/12/2023 CLINICAL DATA:  Initial treatment strategy for lytic lesion on x-ray. EXAM: NUCLEAR MEDICINE PET WHOLE BODY TECHNIQUE: 7.78 mCi F-18 FDG was injected intravenously. Full-ring PET imaging was performed from the head to foot after the radiotracer. CT data was obtained and used for attenuation correction and anatomic localization. Fasting blood glucose: 104 mg/dl COMPARISON:  Abdominal ultrasound 06/29/2023. Head and cervical spine CT 06/27/2023. Abdomen pelvis CT March 2023. FINDINGS: Mediastinal blood pool activity: SUV max 3.1 HEAD/NECK: There is symmetric uptake of radiotracer along the intracranial compartment. No abnormal soft tissue uptake in the neck including along lymph node change of the submandibular, posterior triangle or internal jugular region. Incidental CT findings: The parotid glands and submandibular glands are grossly preserved. Small thyroid gland. Preserved brain volume great matter differentiation. No midline shift or hydrocephalus. Right middle turbinate concha bullosa. Paranasal sinuses and mastoid air cells are clear. Significant streak artifact related to the patient's dental hardware. CHEST: There is no abnormal uptake seen above blood pool in the axillary regions, hilum or mediastinum. Minimal uptake along the left hilum with maximum SUV 4.0. Mild areas on the right as well maximum SUV of 3.6. Mild uptake along the lower esophagus. Correlate with symptoms. There is a small hiatal  hernia. No abnormal lung uptake. Incidental CT findings: Heart is nonenlarged. No pericardial effusion. Normal caliber thoracic aorta with some slight atherosclerotic calcified plaque. Bilateral apical pleural thickening. Chronic lung changes. No consolidation, pneumothorax or effusion. Mild basilar atelectasis. ABDOMEN/PELVIS: Physiologic distribution radiotracer along the parenchymal organs, bowel and renal collecting systems. There is a small area focal more focal uptake along the anorectal region  maximum SUV of 11. Please correlate with clinical findings. No abnormal nodal uptake. The spleen is nonenlarged. Splenic dimension approaches 10.3 cm. There is slight uptake seen along the anterior abdominal wall in the midline with some thickening. Please correlate for an area of scar. Area has maximum SUV of 4.8. Incidental CT findings: Small peripheral right-sided hepatic cystic focus on image 91. Similar focus image 84 in segment 7. No abnormal uptake. Cystically likely a benign cystic focus. Gallbladder is not seen. Mild fatty atrophy of the pancreas. Adrenal glands are unremarkable. No abnormal calcifications seen within either kidney nor along the course of either ureter. Preserved contour to the urinary bladder. The uterus is present. Stomach and small bowel are nondilated. Normal appendix in the right lower quadrant. Large bowel is normal course and caliber with scattered colonic stool. SKELETON: No significant abnormal uptake along the skeleton. There is some physiologic left paraspinal thoracic muscle uptake. Incidental CT findings: Scattered degenerative changes identified. EXTREMITIES: There is physiologic forearm musculature uptake. There is some uptake about the left knee but there appears to be a small effusion please correlate with any symptoms. IMPRESSION: No pathologic areas of abnormal uptake identified along the skeleton, lymph nodes are spleen. Small area of asymmetric uptake along the anorectal region. This could be physiologic but please correlate with clinical evaluation to exclude subtle lesion. Mild asymmetric uptake about the left knee with a small joint effusion and possible thickening. Please correlate with any symptoms. Slight uptake along area of thickening along the midline of the anterior abdominal wall the level of the musculature and subcutaneous tissues. Please correlate for any scar in this location. Electronically Signed   By: Adrianna Horde M.D.   On: 09/12/2023 13:52

## 2023-10-16 ENCOUNTER — Other Ambulatory Visit: Payer: Self-pay | Admitting: Internal Medicine

## 2023-10-16 DIAGNOSIS — J3089 Other allergic rhinitis: Secondary | ICD-10-CM

## 2023-10-17 ENCOUNTER — Other Ambulatory Visit: Payer: Self-pay | Admitting: Internal Medicine

## 2023-10-17 NOTE — Telephone Encounter (Signed)
 Requested Prescriptions  Pending Prescriptions Disp Refills   losartan  (COZAAR ) 25 MG tablet [Pharmacy Med Name: LOSARTAN  POTASSIUM 25 MG TAB] 90 tablet 1    Sig: TAKE 1 TABLET (25 MG TOTAL) BY MOUTH DAILY.     Cardiovascular:  Angiotensin Receptor Blockers Failed - 10/17/2023  3:38 PM      Failed - Valid encounter within last 6 months    Recent Outpatient Visits           2 months ago Calculus of gallbladder without cholecystitis without obstruction   [place] [facility] [place], Angeline ORN, NP   3 months ago Gallstones   [place] [facility] [place], [place], [place]              Passed - Cr in normal range and within 180 days    Creatinine  Date Value Ref Range Status  09/03/2023 0.94 0.44 - 1.00 mg/dL Final   Creat  Date Value Ref Range Status  04/02/2021 0.90 0.60 - 1.00 mg/dL Final         Passed - K in normal range and within 180 days    Potassium  Date Value Ref Range Status  09/03/2023 4.7 3.5 - 5.1 mmol/L Final         Passed - Patient is not pregnant      Passed - Last BP in normal range    BP Readings from Last 1 Encounters:  09/23/23 126/76

## 2023-10-17 NOTE — Telephone Encounter (Signed)
 Requested Prescriptions  Pending Prescriptions Disp Refills   fluticasone  (FLONASE ) 50 MCG/ACT nasal spray [Pharmacy Med Name: FLUTICASONE  PROP 50 MCG SPRAY] 48 mL 0    Sig: SPRAY 2 SPRAYS INTO EACH NOSTRIL EVERY DAY     Ear, Nose, and Throat: Nasal Preparations - Corticosteroids Failed - 10/17/2023 10:33 AM      Failed - Valid encounter within last 12 months    Recent Outpatient Visits           2 months ago Calculus of gallbladder without cholecystitis without obstruction   [place] [facility] [place], Angeline ORN, NP   3 months ago Gallstones   [place] [facility] [place], Angeline ORN, [place]

## 2023-11-01 ENCOUNTER — Other Ambulatory Visit: Payer: Self-pay | Admitting: Internal Medicine

## 2023-11-03 NOTE — Telephone Encounter (Signed)
 Requested Prescriptions  Pending Prescriptions Disp Refills   atorvastatin  (LIPITOR) 40 MG tablet [Pharmacy Med Name: ATORVASTATIN  40 MG TABLET] 90 tablet 0    Sig: TAKE 1 TABLET BY MOUTH EVERY DAY     Cardiovascular:  Antilipid - Statins Failed - 11/03/2023  4:06 PM      Failed - Lipid Panel in normal range within the last 12 months    Cholesterol, Total  Date Value Ref Range Status  10/11/2016 220 (H) 100 - 199 mg/dL Final   Cholesterol  Date Value Ref Range Status  09/02/2022 124 <200 mg/dL Final   LDL Cholesterol (Calc)  Date Value Ref Range Status  09/02/2022 65 mg/dL (calc) Final    Comment:    Reference range: <100 . Desirable range <100 mg/dL for primary prevention;   <70 mg/dL for patients with CHD or diabetic patients  with > or = 2 CHD risk factors. SABRA LDL-C is now calculated using the Martin-Hopkins  calculation, which is a validated novel method providing  better accuracy than the Friedewald equation in the  estimation of LDL-C.  Gladis APPLETHWAITE et al. SANDREA. 7986;689(80): 2061-2068  (http://education.[employer].com/faq/FAQ164)    HDL  Date Value Ref Range Status  09/02/2022 34 (L) > OR = 50 mg/dL Final  93/77/7981 42 >60 mg/dL Final   Triglycerides  Date Value Ref Range Status  09/02/2022 168 (H) <150 mg/dL Final         Passed - Patient is not pregnant      Passed - Valid encounter within last 12 months    Recent Outpatient Visits           3 months ago Calculus of gallbladder without cholecystitis without obstruction   [place] [facility] [place], Angeline ORN, NP   4 months ago Gallstones   [facility] [place], Angeline ORN, [place]

## 2023-11-15 ENCOUNTER — Other Ambulatory Visit: Payer: Self-pay | Admitting: Internal Medicine

## 2023-11-15 DIAGNOSIS — I10 Essential (primary) hypertension: Secondary | ICD-10-CM

## 2023-11-17 NOTE — Telephone Encounter (Signed)
 Requested Prescriptions  Pending Prescriptions Disp Refills   metoprolol  succinate (TOPROL -XL) 50 MG 24 hr tablet [Pharmacy Med Name: METOPROLOL  SUCC ER 50 MG TAB] 90 tablet 0    Sig: TAKE 1 TABLET BY MOUTH EVERY DAY WITH OR IMMEDIATELY FOLLOWING A MEAL     Cardiovascular:  Beta Blockers Passed - 11/17/2023  2:48 PM      Passed - Last BP in normal range    BP Readings from Last 1 Encounters:  09/23/23 126/76         Passed - Last Heart Rate in normal range    Pulse Readings from Last 1 Encounters:  09/23/23 99         Passed - Valid encounter within last 6 months    Recent Outpatient Visits           3 months ago Calculus of gallbladder without cholecystitis without obstruction   [place] [facility] [place], Angeline ORN, NP   4 months ago Gallstones   [facility] [place], Angeline ORN, [place]

## 2023-11-29 ENCOUNTER — Other Ambulatory Visit: Payer: Self-pay | Admitting: Internal Medicine

## 2023-12-03 NOTE — Telephone Encounter (Signed)
 Requested medication (s) are due for refill today: routing for review  Requested medication (s) are on the active medication list: no  Last refill:  n/a  Future visit scheduled: no  Notes to clinic:  Unable to refill per protocol, Rx expired. Not on current list, routing for approval.      Requested Prescriptions  Pending Prescriptions Disp Refills   omeprazole  (PRILOSEC) 40 MG capsule [Pharmacy Med Name: OMEPRAZOLE  DR 40 MG CAPSULE] 180 capsule 1    Sig: TAKE 1 CAPSULE (40 MG TOTAL) BY MOUTH IN THE MORNING AND AT BEDTIME.     Gastroenterology: Proton Pump Inhibitors Passed - 12/03/2023 10:50 AM      Passed - Valid encounter within last 12 months    Recent Outpatient Visits           4 months ago Calculus of gallbladder without cholecystitis without obstruction   [place] [facility] [place], Angeline ORN, NP   5 months ago Gallstones   [place] [facility] [place], Angeline ORN, [place]

## 2023-12-15 DIAGNOSIS — R195 Other fecal abnormalities: Secondary | ICD-10-CM | POA: Diagnosis not present

## 2023-12-15 DIAGNOSIS — R948 Abnormal results of function studies of other organs and systems: Secondary | ICD-10-CM | POA: Diagnosis not present

## 2023-12-15 DIAGNOSIS — K219 Gastro-esophageal reflux disease without esophagitis: Secondary | ICD-10-CM | POA: Diagnosis not present

## 2023-12-24 ENCOUNTER — Inpatient Hospital Stay: Attending: Oncology

## 2023-12-24 ENCOUNTER — Encounter: Payer: Self-pay | Admitting: Oncology

## 2023-12-24 ENCOUNTER — Inpatient Hospital Stay (HOSPITAL_BASED_OUTPATIENT_CLINIC_OR_DEPARTMENT_OTHER): Admitting: Oncology

## 2023-12-24 VITALS — BP 147/83 | HR 76 | Temp 97.6°F | Resp 16 | Wt 141.0 lb

## 2023-12-24 DIAGNOSIS — Z7964 Long term (current) use of myelosuppressive agent: Secondary | ICD-10-CM | POA: Insufficient documentation

## 2023-12-24 DIAGNOSIS — D473 Essential (hemorrhagic) thrombocythemia: Secondary | ICD-10-CM | POA: Diagnosis not present

## 2023-12-24 LAB — CBC WITH DIFFERENTIAL (CANCER CENTER ONLY)
Abs Immature Granulocytes: 0.02 K/uL (ref 0.00–0.07)
Basophils Absolute: 0.1 K/uL (ref 0.0–0.1)
Basophils Relative: 1 %
Eosinophils Absolute: 0.1 K/uL (ref 0.0–0.5)
Eosinophils Relative: 1 %
HCT: 36.4 % (ref 36.0–46.0)
Hemoglobin: 12.6 g/dL (ref 12.0–15.0)
Immature Granulocytes: 0 %
Lymphocytes Relative: 37 %
Lymphs Abs: 1.9 K/uL (ref 0.7–4.0)
MCH: 40.8 pg — ABNORMAL HIGH (ref 26.0–34.0)
MCHC: 34.6 g/dL (ref 30.0–36.0)
MCV: 117.8 fL — ABNORMAL HIGH (ref 80.0–100.0)
Monocytes Absolute: 0.4 K/uL (ref 0.1–1.0)
Monocytes Relative: 7 %
Neutro Abs: 2.8 K/uL (ref 1.7–7.7)
Neutrophils Relative %: 54 %
Platelet Count: 419 K/uL — ABNORMAL HIGH (ref 150–400)
RBC: 3.09 MIL/uL — ABNORMAL LOW (ref 3.87–5.11)
RDW: 13.3 % (ref 11.5–15.5)
WBC Count: 5.2 K/uL (ref 4.0–10.5)
nRBC: 0 % (ref 0.0–0.2)

## 2023-12-24 LAB — CMP (CANCER CENTER ONLY)
ALT: 18 U/L (ref 0–44)
AST: 25 U/L (ref 15–41)
Albumin: 3.6 g/dL (ref 3.5–5.0)
Alkaline Phosphatase: 126 U/L (ref 38–126)
Anion gap: 9 (ref 5–15)
BUN: 14 mg/dL (ref 8–23)
CO2: 23 mmol/L (ref 22–32)
Calcium: 8.8 mg/dL — ABNORMAL LOW (ref 8.9–10.3)
Chloride: 105 mmol/L (ref 98–111)
Creatinine: 0.89 mg/dL (ref 0.44–1.00)
GFR, Estimated: >60 mL/min (ref >60)
Glucose, Bld: 86 mg/dL (ref 70–99)
Potassium: 4 mmol/L (ref 3.5–5.1)
Sodium: 137 mmol/L (ref 135–145)
Total Bilirubin: 1 mg/dL (ref 0.0–1.2)
Total Protein: 6.5 g/dL (ref 6.5–8.1)

## 2023-12-24 NOTE — Assessment & Plan Note (Signed)
Calcium is chronically decreased.  Stable. Recommend patient to continue calcium supplementation.

## 2023-12-24 NOTE — Assessment & Plan Note (Addendum)
 Labs reviewed and discussed with patient Slightly increased platelet count.  Monitor closely. Continue hydroxyurea  to 1000mg  on 2 days per week, and 500mg  daily the rest of the days.

## 2023-12-24 NOTE — Progress Notes (Signed)
 Hematology/Oncology Progress note [phone] [phone]      Patient Care Team: Antonette Angeline ORN, NP as PCP - General (Internal Medicine) Babara Call, MD as Consulting Physician (Oncology)  ASSESSMENT & PLAN:   Essential thrombocythemia [facility]) Labs reviewed and discussed with patient Slightly increased platelet count.  Monitor closely. Continue hydroxyurea  to 1000mg  on 2 days per week, and 500mg  daily the rest of the days.   Hypocalcemia Calcium  is chronically decreased.  Stable. Recommend patient to continue calcium  supplementation.   Orders Placed This Encounter  Procedures   CMP (Cancer Center only)    Standing Status:   Future    Expected Date:   03/24/2024    Expiration Date:   06/22/2024   CBC with Differential (Cancer Center Only)    Standing Status:   Future    Expected Date:   03/24/2024    Expiration Date:   06/22/2024    Follow up 3 months All questions were answered. The patient knows to call the clinic with any problems, questions or concerns.  Call Babara, MD, PhD [facility] Hematology Oncology 12/24/2023    CHIEF COMPLAINTS/REASON FOR VISIT:  Essential thrombocythemia  HISTORY OF PRESENTING ILLNESS:  Tiffany Richardson is a 78 y.o. female who was seen in consultation at the request of Baity, Angeline ORN, NP for evaluation of thromcbocytosis Reviewed patient's labs.  08/07/2020 labs showed elevated platelet counts at 904,000.  Normal wbc  and hemoglobin   Reviewed patient's previous labs. Thrombocytosis onset is chronic onset , duration since at least 2017 No aggravating or elevated factors. Associated symptoms or signs:  Denies weight loss, fever, chills, fatigue, night sweats.  Context:  Smoking history: Denies Family history of polycythemia.  Denies.  However his father had a history of needing frequent blood removal History of iron deficiency anemia: Denies History of DVT: Denies She also reports chronic abdominal bloating.  She has IBS.   Denies any unintentional weight loss, fever, nausea vomiting.  No recent colonoscopy in current EMR. She is widowed, husband had colon cancer.  # Jak 2 V61F mutation positive 08/21/2020 bone marrow biopsy results showed hypercellular bone marrow for age with features of myeloproliferative neoplasm.  Consistent with essential thrombocythemia, iron deficient polycythemia vera, pretty fibrotic/early primary myelofibrosis. Patient has no erythrocytosis.  No chronic leukocytosis.  Clinically most consistent with essential thrombocythemia.  # Chronic bloating and abdominal discomfort, possible due to chronic IBS symptoms.  Last colonoscopy 2013. Patient was seen by Dr. Viktoria in 2019 and was recommended to increase soluble fiber, MiraLAX, if not helpful trial of FODMAP diet and probiotics.  Discussed with patient that I recommend surveillance colonoscopy.  07/06/2021, CT scan showed constipation.  Otherwise no acute intra-a patient bdominal or intrapelvic abnormality. Reports alternate of constipation and diarrhea.  Not associated with any food or medication.  She had syncope in March 2025 and was admitted for evaluation.  She also had nausea vomiting which was felt to be due to cholelithiasis without cholecystitis..  She had a negative CT scan without contrast. 06/27/2023, CT cervical spine without contrast showed no CT evidence of acute osseous abnormality.  Multiple small lucent lesions within the vertebral bodies, correlate for any history of marrow disease such as myeloma.   INTERVAL HISTORY Tiffany Richardson is a 78 y.o. female who has above history reviewed by me today presents for follow up visit for management of essential thrombocythemia Patient has been on  hydroxyurea  to 1000mg  2 day per week, and 500mg  daily the  rest of the days.  She will have sigmoidoscopy for further evaluation of anorectal abnormal activity on PET scan She has no other new complaints.+ Life stress   Review of Systems   Constitutional:  Negative for appetite change, chills, fatigue and fever.  HENT:   Negative for hearing loss and voice change.   Eyes:  Negative for eye problems.  Respiratory:  Negative for chest tightness and cough.   Cardiovascular:  Negative for chest pain.  Gastrointestinal:  Negative for abdominal distention, abdominal pain and blood in stool.  Endocrine: Negative for hot flashes.  Genitourinary:  Negative for difficulty urinating and frequency.   Musculoskeletal:  Negative for arthralgias.  Skin:  Negative for itching and rash.  Neurological:  Negative for extremity weakness.  Hematological:  Negative for adenopathy.  Psychiatric/Behavioral:  Negative for confusion.     MEDICAL HISTORY:  Past Medical History:  Diagnosis Date   Allergy    Codiene   Anemia    Arthritis    Cancer (HCC)    basal cell   Emphysema of lung (HCC)    GERD (gastroesophageal reflux disease)    Hyperlipidemia    Hypertension    Symptomatic cholelithiasis 2025    SURGICAL HISTORY: Past Surgical History:  Procedure Laterality Date   basal cell removed     Rt eye   BIOPSY  12/19/2022   Procedure: BIOPSY;  Surgeon: Onita Elspeth Sharper, DO;  Location: [facility] ENDOSCOPY;  Service: Gastroenterology;;   COLONOSCOPY     COLONOSCOPY WITH PROPOFOL  N/A 12/19/2022   Procedure: COLONOSCOPY WITH PROPOFOL ;  Surgeon: Onita Elspeth Sharper, DO;  Location: [facility] ENDOSCOPY;  Service: Gastroenterology;  Laterality: N/A;   ESOPHAGOGASTRODUODENOSCOPY (EGD) WITH PROPOFOL  N/A 12/19/2022   Procedure: ESOPHAGOGASTRODUODENOSCOPY (EGD) WITH PROPOFOL ;  Surgeon: Onita Elspeth Sharper, DO;  Location: [facility] ENDOSCOPY;  Service: Gastroenterology;  Laterality: N/A;   POLYPECTOMY  12/19/2022   Procedure: POLYPECTOMY;  Surgeon: Onita Elspeth Sharper, DO;  Location: [facility] ENDOSCOPY;  Service: Gastroenterology;;   squamous cell removed       SOCIAL HISTORY: Social History   Socioeconomic History   Marital status: Widowed     Spouse name: Not on file   Number of children: Not on file   Years of education: Not on file   Highest education level: 12th grade  Occupational History   Not on file  Tobacco Use   Smoking status: Never   Smokeless tobacco: Never  Vaping Use   Vaping status: Never Used  Substance and Sexual Activity   Alcohol use: No   Drug use: No   Sexual activity: Not Currently  Other Topics Concern   Not on file  Social History Narrative   Lives with family   Social Drivers of Health   Financial Resource Strain: Low Risk  (05/22/2023)   Overall Financial Resource Strain (CARDIA)    Difficulty of Paying Living Expenses: Not hard at all  Food Insecurity: No Food Insecurity (06/28/2023)   Hunger Vital Sign    Worried About Running Out of Food in the Last Year: Never true    Ran Out of Food in the Last Year: Never true  Transportation Needs: No Transportation Needs (06/28/2023)   PRAPARE - Administrator, Civil Service (Medical): No    Lack of Transportation (Non-Medical): No  Physical Activity: Insufficiently Active (05/22/2023)   Exercise Vital Sign    Days of Exercise per Week: 4 days    Minutes of Exercise per Session: 30 min  Stress: No Stress  Concern Present (05/22/2023)   [employer] of Occupational Health - Occupational Stress Questionnaire    Feeling of Stress : Only a little  Social Connections: Moderately Integrated (06/28/2023)   Social Connection and Isolation Panel    Frequency of Communication with Friends and Family: More than three times a week    Frequency of Social Gatherings with Friends and Family: Twice a week    Attends Religious Services: More than 4 times per year    Active Member of [employer] or Organizations: Yes    Attends Banker Meetings: More than 4 times per year    Marital Status: Widowed  Intimate Partner Violence: Not At Risk (06/28/2023)   Humiliation, Afraid, Rape, and Kick questionnaire    Fear of Current or Ex-Partner: No     Emotionally Abused: No    Physically Abused: No    Sexually Abused: No    FAMILY HISTORY: Family History  Problem Relation Age of Onset   Leukemia Mother    Heart disease Father    Non-Hodgkin's lymphoma Daughter     ALLERGIES:  is allergic to codeine.  MEDICATIONS:  Current Outpatient Medications  Medication Sig Dispense Refill   aspirin  EC 81 MG tablet Take 81 mg by mouth.     atorvastatin  (LIPITOR) 40 MG tablet TAKE 1 TABLET BY MOUTH EVERY DAY 90 tablet 0   fluticasone  (FLONASE ) 50 MCG/ACT nasal spray SPRAY 2 SPRAYS INTO EACH NOSTRIL EVERY DAY 48 mL 0   hydroxyurea  (HYDREA ) 500 MG capsule Take 1 capsule (500 mg total) by mouth See admin instructions. Take 1000mg  on Wednesdays and Sundays, and take 500mg  daily for the rest of the week. 94 capsule 2   hyoscyamine  (LEVSIN  SL) 0.125 MG SL tablet Place under the tongue every 6 (six) hours as needed.     losartan  (COZAAR ) 25 MG tablet TAKE 1 TABLET (25 MG TOTAL) BY MOUTH DAILY. 90 tablet 1   metoprolol  succinate (TOPROL -XL) 50 MG 24 hr tablet TAKE 1 TABLET BY MOUTH EVERY DAY WITH OR IMMEDIATELY FOLLOWING A MEAL 90 tablet 0   omeprazole  (PRILOSEC) 40 MG capsule TAKE 1 CAPSULE (40 MG TOTAL) BY MOUTH IN THE MORNING AND AT BEDTIME. 180 capsule 1   sucralfate (CARAFATE) 1 g tablet Take 1 g by mouth 2 (two) times daily. Before meals     No current facility-administered medications for this visit.     PHYSICAL EXAMINATION: ECOG PERFORMANCE STATUS: 0 - Asymptomatic Vitals:   12/24/23 1315 12/24/23 1325  BP: (!) 164/92 (!) 147/83  Pulse: 71 76  Resp: 16   Temp: 97.6 F (36.4 C)   SpO2: 98%    Filed Weights   12/24/23 1315  Weight: 141 lb (64 kg)    Physical Exam Constitutional:      General: She is not in acute distress. HENT:     Head: Normocephalic.  Eyes:     General: No scleral icterus. Cardiovascular:     Rate and Rhythm: Normal rate.  Pulmonary:     Effort: Pulmonary effort is normal. No respiratory distress.   Abdominal:     General: There is no distension.  Musculoskeletal:        General: No deformity. Normal range of motion.     Cervical back: Normal range of motion.  Skin:    General: Skin is warm and dry.     Findings: No erythema or rash.  Neurological:     Mental Status: She is alert and oriented to person, place,  and time. Mental status is at baseline.     Cranial Nerves: No cranial nerve deficit.     Coordination: Coordination normal.  Psychiatric:        Mood and Affect: Mood normal.      LABORATORY DATA:  I have reviewed the data as listed    Latest Ref Rng & Units 12/24/2023   12:58 PM 09/03/2023    1:34 PM 06/30/2023    5:12 AM  CBC  WBC 4.0 - 10.5 K/uL 5.2  5.0  6.5   Hemoglobin 12.0 - 15.0 g/dL 87.3  86.8  88.4   Hematocrit 36.0 - 46.0 % 36.4  37.6  33.4   Platelets 150 - 400 K/uL 419  398  399       Latest Ref Rng & Units 12/24/2023   12:58 PM 09/03/2023    1:35 PM 06/30/2023    5:12 AM  CMP  Glucose 70 - 99 mg/dL 86  95  898   BUN 8 - 23 mg/dL 14  15  11    Creatinine 0.44 - 1.00 mg/dL 9.10  9.05  9.16   Sodium 135 - 145 mmol/L 137  137  142   Potassium 3.5 - 5.1 mmol/L 4.0  4.7  4.0   Chloride 98 - 111 mmol/L 105  105  106   CO2 22 - 32 mmol/L 23  26  27    Calcium  8.9 - 10.3 mg/dL 8.8  8.6  8.7   Total Protein 6.5 - 8.1 g/dL 6.5  6.6    Total Bilirubin 0.0 - 1.2 mg/dL 1.0  0.8    Alkaline Phos 38 - 126 U/L 126  125    AST 15 - 41 U/L 25  48    ALT 0 - 44 U/L 18  44     Lab Results  Component Value Date   IRON 85 08/15/2020   TIBC 337 08/15/2020   FERRITIN 31 08/15/2020       RADIOGRAPHIC STUDIES: I have personally reviewed the radiological images as listed and agreed with the findings in the report. No results found.

## 2023-12-26 ENCOUNTER — Encounter: Payer: Self-pay | Admitting: Gastroenterology

## 2023-12-29 ENCOUNTER — Ambulatory Visit: Admitting: Anesthesiology

## 2023-12-29 ENCOUNTER — Ambulatory Visit
Admission: RE | Admit: 2023-12-29 | Discharge: 2023-12-29 | Disposition: A | Discharge status: Home / Self Care | Attending: Gastroenterology | Admitting: Gastroenterology

## 2023-12-29 ENCOUNTER — Encounter
Admission: RE | Disposition: A | Discharge status: Home / Self Care | Payer: Self-pay | Source: Home / Self Care | Attending: Gastroenterology

## 2023-12-29 ENCOUNTER — Encounter: Payer: Self-pay | Admitting: Gastroenterology

## 2023-12-29 DIAGNOSIS — Z79899 Other long term (current) drug therapy: Secondary | ICD-10-CM | POA: Insufficient documentation

## 2023-12-29 DIAGNOSIS — K219 Gastro-esophageal reflux disease without esophagitis: Secondary | ICD-10-CM | POA: Insufficient documentation

## 2023-12-29 DIAGNOSIS — J439 Emphysema, unspecified: Secondary | ICD-10-CM | POA: Insufficient documentation

## 2023-12-29 DIAGNOSIS — Z860101 Personal history of adenomatous and serrated colon polyps: Secondary | ICD-10-CM | POA: Diagnosis not present

## 2023-12-29 DIAGNOSIS — Z83719 Family history of colon polyps, unspecified: Secondary | ICD-10-CM | POA: Diagnosis not present

## 2023-12-29 DIAGNOSIS — K641 Second degree hemorrhoids: Secondary | ICD-10-CM | POA: Diagnosis not present

## 2023-12-29 DIAGNOSIS — I10 Essential (primary) hypertension: Secondary | ICD-10-CM | POA: Diagnosis not present

## 2023-12-29 DIAGNOSIS — R948 Abnormal results of function studies of other organs and systems: Secondary | ICD-10-CM | POA: Diagnosis not present

## 2023-12-29 DIAGNOSIS — R933 Abnormal findings on diagnostic imaging of other parts of digestive tract: Secondary | ICD-10-CM | POA: Diagnosis not present

## 2023-12-29 DIAGNOSIS — K649 Unspecified hemorrhoids: Secondary | ICD-10-CM | POA: Diagnosis not present

## 2023-12-29 HISTORY — PX: FLEXIBLE SIGMOIDOSCOPY: SHX5431

## 2023-12-29 SURGERY — SIGMOIDOSCOPY, FLEXIBLE
Anesthesia: General

## 2023-12-29 MED ORDER — PROPOFOL 10 MG/ML IV BOLUS
INTRAVENOUS | Status: DC | PRN
  Administered 2023-12-29: 60 mg via INTRAVENOUS

## 2023-12-29 MED ORDER — PROPOFOL 500 MG/50ML IV EMUL
INTRAVENOUS | Status: DC | PRN
  Administered 2023-12-29: 140 ug/kg/min via INTRAVENOUS

## 2023-12-29 MED ORDER — SODIUM CHLORIDE 0.9 % IV SOLN
INTRAVENOUS | Status: DC
  Administered 2023-12-29: 20 mL/h via INTRAVENOUS

## 2023-12-29 NOTE — Op Note (Signed)
 [facility] Gastroenterology Patient Name: Tiffany Richardson Procedure Date: 12/29/2023 11:31 AM MRN: [id number] Account #: [id number] Date of Birth: Sep 11, 1945 Admit Type: Outpatient Age: 78 Room: [facility] ENDO ROOM 2 Gender: Female Note Status: Finalized Instrument Name: Peds Colonoscope [id number] Procedure:             Flexible Sigmoidoscopy Indications:           Abnormal imaging/PET Scan Providers:             [name] DO, DO Medicines:             Monitored Anesthesia Care Complications:         No immediate complications. Estimated blood loss: None. Procedure:             Pre-Anesthesia Assessment:                        - Prior to the procedure, a History and Physical was                         performed, and patient medications and allergies were                         reviewed. The patient is competent. The risks and                         benefits of the procedure and the sedation options and                         risks were discussed with the patient. All questions                         were answered and informed consent was obtained.                         Patient identification and proposed procedure were                         verified by the physician, the nurse, the anesthetist                         and the technician in the endoscopy suite. Mental                         Status Examination: alert and oriented. Airway                         Examination: normal oropharyngeal airway and neck                         mobility. Respiratory Examination: clear to                         auscultation. CV Examination: RRR, no murmurs, no S3                         or S4. Prophylactic Antibiotics: The patient does not  require prophylactic antibiotics. Prior                         Anticoagulants: The patient has taken no anticoagulant                         or antiplatelet agents. ASA Grade Assessment: III - A                          patient with severe systemic disease. After reviewing                         the risks and benefits, the patient was deemed in                         satisfactory condition to undergo the procedure. The                         anesthesia plan was to use monitored anesthesia care                         (MAC). Immediately prior to administration of                         medications, the patient was re-assessed for adequacy                         to receive sedatives. The heart rate, respiratory                         rate, oxygen saturations, blood pressure, adequacy of                         pulmonary ventilation, and response to care were                         monitored throughout the procedure. The physical                         status of the patient was re-assessed after the                         procedure.                        After obtaining informed consent, the scope was passed                         under direct vision. The Colonoscope was introduced                         through the anus and advanced to the the descending                         colon. The flexible sigmoidoscopy was accomplished                         without difficulty. The patient tolerated the  procedure well. The quality of the bowel preparation                         was good. Findings:      Hemorrhoids were found on perianal exam.      The digital rectal exam was normal. Pertinent negatives include normal       sphincter tone.      Non-bleeding internal hemorrhoids were found during retroflexion and       during perianal exam. The hemorrhoids were Grade II (internal       hemorrhoids that prolapse but reduce spontaneously). Estimated blood       loss: none.      The exam was otherwise without abnormality. Impression:            - Hemorrhoids found on perianal exam.                        - Non-bleeding internal hemorrhoids.                        - The  examination was otherwise normal.                        - No specimens collected. Recommendation:        - Discharge patient to home.                        - Resume previous diet.                        - Continue present medications.                        - Return to referring physician as previously                         scheduled.                        - The findings and recommendations were discussed with                         the patient. Procedure Code(s):     --- Professional ---                        [id number], Sigmoidoscopy, flexible; diagnostic, including                         collection of specimen(s) by brushing or washing, when                         performed (separate procedure) Diagnosis Code(s):     --- Professional ---                        K64.1, Second degree hemorrhoids CPT copyright 2022 American Medical Association. All rights reserved. The codes documented in this report are preliminary and upon coder review may  be revised to meet current compliance requirements. Attending Participation:      I personally performed the entire procedure. Elspeth Jungling, DO [name] DO, DO 12/29/2023 12:00:42 PM This report has  been signed electronically. Number of Addenda: 0 Note Initiated On: 12/29/2023 11:31 AM Total Procedure Duration: 0 hours 6 minutes 30 seconds  Estimated Blood Loss:  Estimated blood loss: none.      [facility]

## 2023-12-29 NOTE — Anesthesia Preprocedure Evaluation (Signed)
 Anesthesia Evaluation  Patient identified by MRN, date of birth, ID band Patient awake    Reviewed: Allergy & Precautions, NPO status , Patient's Chart, lab work & pertinent test results  History of Anesthesia Complications Negative for: history of anesthetic complications  Airway Mallampati: III  TM Distance: >3 FB Neck ROM: full    Dental  (+) Chipped   Pulmonary COPD   Pulmonary exam normal        Cardiovascular hypertension, On Medications negative cardio ROS Normal cardiovascular exam     Neuro/Psych negative neurological ROS  negative psych ROS   GI/Hepatic Neg liver ROS,GERD  Medicated,,  Endo/Other  negative endocrine ROS    Renal/GU negative Renal ROS  negative genitourinary   Musculoskeletal   Abdominal   Peds  Hematology  (+) Blood dyscrasia, anemia   Anesthesia Other Findings Past Medical History: No date: Allergy     Comment:  Codiene No date: Anemia No date: Arthritis No date: Cancer (HCC)     Comment:  basal cell No date: Emphysema of lung (HCC) No date: GERD (gastroesophageal reflux disease) No date: Hyperlipidemia No date: Hypertension 2025: Symptomatic cholelithiasis  Past Surgical History: No date: basal cell removed     Comment:  Rt eye 12/19/2022: BIOPSY     Comment:  Procedure: BIOPSY;  Surgeon: Onita Elspeth Sharper, DO;               Location: [facility] ENDOSCOPY;  Service: Gastroenterology;; No date: COLONOSCOPY 12/19/2022: COLONOSCOPY WITH PROPOFOL ; N/A     Comment:  Procedure: COLONOSCOPY WITH PROPOFOL ;  Surgeon: Onita Elspeth Sharper, DO;  Location: [facility] ENDOSCOPY;  Service:               Gastroenterology;  Laterality: N/A; 12/19/2022: ESOPHAGOGASTRODUODENOSCOPY (EGD) WITH PROPOFOL ; N/A     Comment:  Procedure: ESOPHAGOGASTRODUODENOSCOPY (EGD) WITH               PROPOFOL ;  Surgeon: Onita Elspeth Sharper, DO;  Location:              [facility] ENDOSCOPY;  Service:  Gastroenterology;  Laterality:               N/A; 12/19/2022: POLYPECTOMY     Comment:  Procedure: POLYPECTOMY;  Surgeon: Onita Elspeth Sharper,              DO;  Location: [facility] ENDOSCOPY;  Service:               Gastroenterology;; No date: squamous cell removed   BMI    Body Mass Index: 23.38 kg/m      Reproductive/Obstetrics negative OB ROS                              Anesthesia Physical Anesthesia Plan  ASA: 3  Anesthesia Plan: General   Post-op Pain Management: Minimal or no pain anticipated   Induction: Intravenous  PONV Risk Score and Plan: 1  Airway Management Planned: Natural Airway and Nasal Cannula  Additional Equipment:   Intra-op Plan:   Post-operative Plan:   Informed Consent: I have reviewed the patients History and Physical, chart, labs and discussed the procedure including the risks, benefits and alternatives for the proposed anesthesia with the patient or authorized representative who has indicated his/her understanding and acceptance.     Dental Advisory Given  Plan Discussed with: Anesthesiologist, CRNA and Surgeon  Anesthesia Plan Comments: (Patient consented for risks of anesthesia including but not limited to:  - adverse reactions to medications - risk of airway placement if required - damage to eyes, teeth, lips or other oral mucosa - nerve damage due to positioning  - sore throat or hoarseness - Damage to heart, brain, nerves, lungs, other parts of body or loss of life  Patient voiced understanding and assent.)        Anesthesia Quick Evaluation

## 2023-12-29 NOTE — Interval H&P Note (Signed)
 History and Physical Interval Note: Preprocedure H&P from 12/29/23  was reviewed and there was no interval change after seeing and examining the patient.  Written consent was obtained from the patient after discussion of risks, benefits, and alternatives. Patient has consented to proceed with Flexible Sigmoidoscopy with possible intervention   12/29/2023 11:38 AM  Tiffany Richardson  has presented today for surgery, with the diagnosis of R94.8 (ICD-10-CM) - Abnormal PET scan of colon.  The various methods of treatment have been discussed with the patient and family. After consideration of risks, benefits and other options for treatment, the patient has consented to  Procedure(s): SIGMOIDOSCOPY, FLEXIBLE (N/A) as a surgical intervention.  The patient's history has been reviewed, patient examined, no change in status, stable for surgery.  I have reviewed the patient's chart and labs.  Questions were answered to the patient's satisfaction.     [name]

## 2023-12-29 NOTE — Transfer of Care (Signed)
 Immediate Anesthesia Transfer of Care Note  Patient: Tiffany Richardson  Procedure(s) Performed: [place] SIDE  Patient Location: PACU  Anesthesia Type:General  Level of Consciousness: awake, alert , and oriented  Airway & Oxygen Therapy: Patient Spontanous Breathing  Post-op Assessment: Report given to RN and Post -op Vital signs reviewed and stable  Post vital signs: Reviewed and stable  Last Vitals:  Vitals Value Taken Time  BP    Temp    Pulse    Resp    SpO2      Last Pain:  Vitals:   12/29/23 1028  TempSrc: Temporal  PainSc: 0-No pain         Complications: No notable events documented.

## 2023-12-29 NOTE — H&P (Signed)
 Pre-Procedure H&P   Patient ID: Tiffany Richardson is a 78 y.o. female.  Gastroenterology Provider: [name], DO  Referring Provider: Romero Antigua, PA PCP: Antonette Angeline LELON, NP  Date: 12/29/2023  HPI Tiffany Richardson is a 78 y.o. female who presents today for Colonoscopy for abnormal PET scan .  Patient underwent PET scan to evaluate for lytic bone lesions.  Increased uptake in the rectum.  Last underwent EGD and colonoscopy in August 2024 with internal hemorrhoids and 2 adenomatous polyps removed.  Normal TI.  Biopsies negative for microscopic colitis.  She has had 4 adenomatous polyps lifetime  Father with history colon polyps   Past Medical History:  Diagnosis Date   Allergy    Codiene   Anemia    Arthritis    Cancer (HCC)    basal cell   Emphysema of lung (HCC)    GERD (gastroesophageal reflux disease)    Hyperlipidemia    Hypertension    Symptomatic cholelithiasis 2025    Past Surgical History:  Procedure Laterality Date   basal cell removed     Rt eye   BIOPSY  12/19/2022   Procedure: BIOPSY;  Surgeon: Jungling Elspeth Ozell, DO;  Location: [facility] ENDOSCOPY;  Service: Gastroenterology;;   COLONOSCOPY     COLONOSCOPY WITH PROPOFOL  N/A 12/19/2022   Procedure: COLONOSCOPY WITH PROPOFOL ;  Surgeon: Jungling Elspeth Ozell, DO;  Location: [facility] ENDOSCOPY;  Service: Gastroenterology;  Laterality: N/A;   ESOPHAGOGASTRODUODENOSCOPY (EGD) WITH PROPOFOL  N/A 12/19/2022   Procedure: ESOPHAGOGASTRODUODENOSCOPY (EGD) WITH PROPOFOL ;  Surgeon: Jungling Elspeth Ozell, DO;  Location: [facility] ENDOSCOPY;  Service: Gastroenterology;  Laterality: N/A;   POLYPECTOMY  12/19/2022   Procedure: POLYPECTOMY;  Surgeon: Jungling Elspeth Ozell, DO;  Location: [facility] ENDOSCOPY;  Service: Gastroenterology;;   squamous cell removed       Family History No h/o GI disease or malignancy  Review of Systems  Constitutional:  Negative for activity change, appetite change, chills, diaphoresis, fatigue,  fever and unexpected weight change.  HENT:  Negative for trouble swallowing and voice change.   Respiratory:  Negative for shortness of breath and wheezing.   Cardiovascular:  Negative for chest pain, palpitations and leg swelling.  Gastrointestinal:  Positive for diarrhea. Negative for abdominal distention, abdominal pain, anal bleeding, blood in stool, constipation, nausea, rectal pain and vomiting.  Musculoskeletal:  Negative for arthralgias and myalgias.  Skin:  Negative for color change and pallor.  Neurological:  Negative for dizziness, syncope and weakness.  Psychiatric/Behavioral:  Negative for confusion.   All other systems reviewed and are negative.    Medications No current facility-administered medications on file prior to encounter.   Current Outpatient Medications on File Prior to Encounter  Medication Sig Dispense Refill   aspirin  EC 81 MG tablet Take 81 mg by mouth.     atorvastatin  (LIPITOR) 40 MG tablet TAKE 1 TABLET BY MOUTH EVERY DAY 90 tablet 0   fluticasone  (FLONASE ) 50 MCG/ACT nasal spray SPRAY 2 SPRAYS INTO EACH NOSTRIL EVERY DAY 48 mL 0   hydroxyurea  (HYDREA ) 500 MG capsule Take 1 capsule (500 mg total) by mouth See admin instructions. Take 1000mg  on Wednesdays and Sundays, and take 500mg  daily for the rest of the week. 94 capsule 2   losartan  (COZAAR ) 25 MG tablet TAKE 1 TABLET (25 MG TOTAL) BY MOUTH DAILY. 90 tablet 1   metoprolol  succinate (TOPROL -XL) 50 MG 24 hr tablet TAKE 1 TABLET BY MOUTH EVERY DAY WITH OR IMMEDIATELY FOLLOWING A MEAL 90 tablet  0   omeprazole  (PRILOSEC) 40 MG capsule TAKE 1 CAPSULE (40 MG TOTAL) BY MOUTH IN THE MORNING AND AT BEDTIME. 180 capsule 1    Pertinent medications related to GI and procedure were reviewed by me with the patient prior to the procedure   Current Facility-Administered Medications:    0.9 %  sodium chloride  infusion, , Intravenous, Continuous, Onita Elspeth Sharper, DO, Last Rate: 20 mL/hr at 12/29/23 1036, 20 mL/hr  at 12/29/23 1036  sodium chloride  20 mL/hr (12/29/23 1036)       Allergies  Allergen Reactions   Codeine Swelling   Allergies were reviewed by me prior to the procedure  Objective   Body mass index is 23.38 kg/m. Vitals:   12/29/23 1028  BP: 135/82  Pulse: 83  Resp: 20  Temp: (!) 96.7 F (35.9 C)  TempSrc: Temporal  SpO2: 99%  Weight: 61.8 kg  Height: 5' 4 (1.626 m)     Physical Exam Vitals and nursing note reviewed.  Constitutional:      General: She is not in acute distress.    Appearance: Normal appearance. She is not ill-appearing, toxic-appearing or diaphoretic.  HENT:     Head: Normocephalic and atraumatic.     Nose: Nose normal.     Mouth/Throat:     Mouth: Mucous membranes are moist.     Pharynx: Oropharynx is clear.  Eyes:     General: No scleral icterus.    Extraocular Movements: Extraocular movements intact.  Cardiovascular:     Rate and Rhythm: Normal rate and regular rhythm.     Heart sounds: Normal heart sounds. No murmur heard.    No friction rub. No gallop.  Pulmonary:     Effort: Pulmonary effort is normal. No respiratory distress.     Breath sounds: Normal breath sounds. No wheezing, rhonchi or rales.  Abdominal:     General: Bowel sounds are normal. There is no distension.     Palpations: Abdomen is soft.     Tenderness: There is no abdominal tenderness. There is no guarding or rebound.  Musculoskeletal:     Cervical back: Neck supple.     Right lower leg: No edema.     Left lower leg: No edema.  Skin:    General: Skin is warm and dry.     Coloration: Skin is not jaundiced or pale.  Neurological:     General: No focal deficit present.     Mental Status: She is alert and oriented to person, place, and time. Mental status is at baseline.  Psychiatric:        Mood and Affect: Mood normal.        Behavior: Behavior normal.        Thought Content: Thought content normal.        Judgment: Judgment normal.      Assessment:  Ms.  LAKIN Richardson is a 78 y.o. female  who presents today for Flexible Sigmoidoscopy for abnormal PET scan .  Plan:  Flexible Sigmoidoscopy with possible intervention today  Flexible Sigmoidoscopy with possible biopsy, control of bleeding, polypectomy, and interventions as necessary has been discussed with the patient/patient representative. Informed consent was obtained from the patient/patient representative after explaining the indication, nature, and risks of the procedure including but not limited to death, bleeding, perforation, missed neoplasm/lesions, cardiorespiratory compromise, and reaction to medications. Opportunity for questions was given and appropriate answers were provided. Patient/patient representative has verbalized understanding is amenable to undergoing the procedure.   Elspeth  Michael Quenton Recendez, DO  [facility] Gastroenterology  Portions of the record may have been created with voice recognition software. Occasional wrong-word or 'sound-a-like' substitutions may have occurred due to the inherent limitations of voice recognition software.  Read the chart carefully and recognize, using context, where substitutions may have occurred.

## 2023-12-29 NOTE — Anesthesia Postprocedure Evaluation (Signed)
 Anesthesia Post Note  Patient: Tiffany Richardson  Procedure(s) Performed: [place] SIDE  Patient location during evaluation: Endoscopy Anesthesia Type: General Level of consciousness: awake and alert Pain management: pain level controlled Vital Signs Assessment: post-procedure vital signs reviewed and stable Respiratory status: spontaneous breathing, nonlabored ventilation and respiratory function stable Cardiovascular status: blood pressure returned to baseline and stable Postop Assessment: no apparent nausea or vomiting Anesthetic complications: no   No notable events documented.   Last Vitals:  Vitals:   12/29/23 1210 12/29/23 1220  BP: 109/66 120/77  Pulse: 71 75  Resp: 20 18  Temp:    SpO2: 100% 100%    Last Pain:  Vitals:   12/29/23 1220  TempSrc:   PainSc: 0-No pain                 Camellia Merilee Louder

## 2023-12-31 DIAGNOSIS — L821 Other seborrheic keratosis: Secondary | ICD-10-CM | POA: Diagnosis not present

## 2023-12-31 DIAGNOSIS — Z85828 Personal history of other malignant neoplasm of skin: Secondary | ICD-10-CM | POA: Diagnosis not present

## 2023-12-31 DIAGNOSIS — Z08 Encounter for follow-up examination after completed treatment for malignant neoplasm: Secondary | ICD-10-CM | POA: Diagnosis not present

## 2023-12-31 DIAGNOSIS — D1801 Hemangioma of skin and subcutaneous tissue: Secondary | ICD-10-CM | POA: Diagnosis not present

## 2023-12-31 DIAGNOSIS — Z7189 Other specified counseling: Secondary | ICD-10-CM | POA: Diagnosis not present

## 2023-12-31 DIAGNOSIS — L814 Other melanin hyperpigmentation: Secondary | ICD-10-CM | POA: Diagnosis not present

## 2023-12-31 DIAGNOSIS — L570 Actinic keratosis: Secondary | ICD-10-CM | POA: Diagnosis not present

## 2023-12-31 DIAGNOSIS — D2272 Melanocytic nevi of left lower limb, including hip: Secondary | ICD-10-CM | POA: Diagnosis not present

## 2023-12-31 DIAGNOSIS — Z872 Personal history of diseases of the skin and subcutaneous tissue: Secondary | ICD-10-CM | POA: Diagnosis not present

## 2023-12-31 DIAGNOSIS — L578 Other skin changes due to chronic exposure to nonionizing radiation: Secondary | ICD-10-CM | POA: Diagnosis not present

## 2024-01-12 ENCOUNTER — Other Ambulatory Visit: Payer: Self-pay | Admitting: Internal Medicine

## 2024-01-12 DIAGNOSIS — J3089 Other allergic rhinitis: Secondary | ICD-10-CM

## 2024-01-13 NOTE — Telephone Encounter (Signed)
 Requested Prescriptions  Pending Prescriptions Disp Refills   fluticasone  (FLONASE ) 50 MCG/ACT nasal spray [Pharmacy Med Name: FLUTICASONE  PROP 50 MCG SPRAY] 48 mL 0    Sig: SPRAY 2 SPRAYS INTO EACH NOSTRIL EVERY DAY     Ear, Nose, and Throat: Nasal Preparations - Corticosteroids Passed - 01/13/2024  9:06 AM      Passed - Valid encounter within last 12 months    Recent Outpatient Visits           5 months ago Calculus of gallbladder without cholecystitis without obstruction   [place] [facility] [place], Angeline ORN, NP   6 months ago Gallstones   [facility] [place], Angeline ORN, [place]

## 2024-01-29 ENCOUNTER — Other Ambulatory Visit: Payer: Self-pay | Admitting: Internal Medicine

## 2024-01-30 NOTE — Telephone Encounter (Signed)
 Requested medications are due for refill today.  yes  Requested medications are on the active medications list.  yes  Last refill. 11/03/2023 #90 0 rf  Future visit scheduled.   no  Notes to clinic.  Labs are expired.     Requested Prescriptions  Pending Prescriptions Disp Refills   atorvastatin  (LIPITOR) 40 MG tablet [Pharmacy Med Name: ATORVASTATIN  40 MG TABLET] 90 tablet 0    Sig: TAKE 1 TABLET BY MOUTH EVERY DAY     Cardiovascular:  Antilipid - Statins Failed - 01/30/2024  2:59 PM      Failed - Lipid Panel in normal range within the last 12 months    Cholesterol, Total  Date Value Ref Range Status  10/11/2016 220 (H) 100 - 199 mg/dL Final   Cholesterol  Date Value Ref Range Status  09/02/2022 124 <200 mg/dL Final   LDL Cholesterol (Calc)  Date Value Ref Range Status  09/02/2022 65 mg/dL (calc) Final    Comment:    Reference range: <100 . Desirable range <100 mg/dL for primary prevention;   <70 mg/dL for patients with CHD or diabetic patients  with > or = 2 CHD risk factors. SABRA LDL-C is now calculated using the Martin-Hopkins  calculation, which is a validated novel method providing  better accuracy than the Friedewald equation in the  estimation of LDL-C.  Gladis APPLETHWAITE et al. SANDREA. 7986;689(80): 2061-2068  (http://education.[facility].com/faq/FAQ164)    HDL  Date Value Ref Range Status  09/02/2022 34 (L) > OR = 50 mg/dL Final  93/77/7981 42 >60 mg/dL Final   Triglycerides  Date Value Ref Range Status  09/02/2022 168 (H) <150 mg/dL Final         Passed - Patient is not pregnant      Passed - Valid encounter within last 12 months    Recent Outpatient Visits           6 months ago Calculus of gallbladder without cholecystitis without obstruction   [facility] [place], Angeline ORN, NP   7 months ago Gallstones   [place] [facility] [place], Angeline ORN, [place]

## 2024-02-13 ENCOUNTER — Other Ambulatory Visit: Payer: Self-pay | Admitting: Internal Medicine

## 2024-02-13 DIAGNOSIS — I10 Essential (primary) hypertension: Secondary | ICD-10-CM

## 2024-02-14 NOTE — Telephone Encounter (Signed)
 Requested Prescriptions  Pending Prescriptions Disp Refills   metoprolol  succinate (TOPROL -XL) 50 MG 24 hr tablet [Pharmacy Med Name: METOPROLOL  SUCC ER 50 MG TAB] 90 tablet 0    Sig: TAKE 1 TABLET BY MOUTH EVERY DAY WITH OR IMMEDIATELY FOLLOWING A MEAL     Cardiovascular:  Beta Blockers Failed - 02/14/2024  9:53 AM      Failed - Valid encounter within last 6 months    Recent Outpatient Visits           6 months ago Calculus of gallbladder without cholecystitis without obstruction   [facility] [place], Angeline ORN, NP   7 months ago Gallstones   [place] [facility] [place], [place] W, NP              Passed - Last BP in normal range    BP Readings from Last 1 Encounters:  12/29/23 120/77         Passed - Last Heart Rate in normal range    Pulse Readings from Last 1 Encounters:  12/29/23 75

## 2024-02-17 ENCOUNTER — Other Ambulatory Visit: Payer: Self-pay | Admitting: Internal Medicine

## 2024-02-19 NOTE — Telephone Encounter (Signed)
 Requested medications are due for refill today.  yes  Requested medications are on the active medications list.  yes  Last refill. 11/03/2023 #90 0 rf  Future visit scheduled.   no  Notes to clinic.  Labs are expired.    Requested Prescriptions  Pending Prescriptions Disp Refills   atorvastatin  (LIPITOR) 40 MG tablet [Pharmacy Med Name: ATORVASTATIN  40 MG TABLET] 90 tablet 0    Sig: TAKE 1 TABLET BY MOUTH EVERY DAY     Cardiovascular:  Antilipid - Statins Failed - 02/19/2024 10:16 AM      Failed - Lipid Panel in normal range within the last 12 months    Cholesterol, Total  Date Value Ref Range Status  10/11/2016 220 (H) 100 - 199 mg/dL Final   Cholesterol  Date Value Ref Range Status  09/02/2022 124 <200 mg/dL Final   LDL Cholesterol (Calc)  Date Value Ref Range Status  09/02/2022 65 mg/dL (calc) Final    Comment:    Reference range: <100 . Desirable range <100 mg/dL for primary prevention;   <70 mg/dL for patients with CHD or diabetic patients  with > or = 2 CHD risk factors. SABRA LDL-C is now calculated using the Martin-Hopkins  calculation, which is a validated novel method providing  better accuracy than the Friedewald equation in the  estimation of LDL-C.  Gladis APPLETHWAITE et al. SANDREA. 7986;689(80): 2061-2068  (http://education.[facility].com/faq/FAQ164)    HDL  Date Value Ref Range Status  09/02/2022 34 (L) > OR = 50 mg/dL Final  93/77/7981 42 >60 mg/dL Final   Triglycerides  Date Value Ref Range Status  09/02/2022 168 (H) <150 mg/dL Final         Passed - Patient is not pregnant      Passed - Valid encounter within last 12 months    Recent Outpatient Visits           7 months ago Calculus of gallbladder without cholecystitis without obstruction   [place] [facility] [place], Angeline ORN, NP   7 months ago Gallstones   [facility] [place], Angeline ORN, [place]

## 2024-03-23 ENCOUNTER — Encounter: Payer: Self-pay | Admitting: Oncology

## 2024-03-23 ENCOUNTER — Inpatient Hospital Stay: Admitting: Oncology

## 2024-03-23 ENCOUNTER — Inpatient Hospital Stay: Attending: Oncology

## 2024-03-23 ENCOUNTER — Ambulatory Visit: Payer: Self-pay | Admitting: Oncology

## 2024-03-23 VITALS — BP 142/75 | HR 73 | Temp 96.0°F | Resp 18 | Wt 141.1 lb

## 2024-03-23 DIAGNOSIS — Z79899 Other long term (current) drug therapy: Secondary | ICD-10-CM | POA: Insufficient documentation

## 2024-03-23 DIAGNOSIS — Z7964 Long term (current) use of myelosuppressive agent: Secondary | ICD-10-CM | POA: Diagnosis not present

## 2024-03-23 DIAGNOSIS — D473 Essential (hemorrhagic) thrombocythemia: Secondary | ICD-10-CM

## 2024-03-23 LAB — CBC WITH DIFFERENTIAL (CANCER CENTER ONLY)
Abs Immature Granulocytes: 0.02 K/uL (ref 0.00–0.07)
Basophils Absolute: 0.1 K/uL (ref 0.0–0.1)
Basophils Relative: 1 %
Eosinophils Absolute: 0.1 K/uL (ref 0.0–0.5)
Eosinophils Relative: 1 %
HCT: 35.6 % — ABNORMAL LOW (ref 36.0–46.0)
Hemoglobin: 12.5 g/dL (ref 12.0–15.0)
Immature Granulocytes: 0 %
Lymphocytes Relative: 36 %
Lymphs Abs: 2.1 K/uL (ref 0.7–4.0)
MCH: 41.1 pg — ABNORMAL HIGH (ref 26.0–34.0)
MCHC: 35.1 g/dL (ref 30.0–36.0)
MCV: 117.1 fL — ABNORMAL HIGH (ref 80.0–100.0)
Monocytes Absolute: 0.4 K/uL (ref 0.1–1.0)
Monocytes Relative: 7 %
Neutro Abs: 3.2 K/uL (ref 1.7–7.7)
Neutrophils Relative %: 55 %
Platelet Count: 355 K/uL (ref 150–400)
RBC: 3.04 MIL/uL — ABNORMAL LOW (ref 3.87–5.11)
RDW: 13.8 % (ref 11.5–15.5)
WBC Count: 5.9 K/uL (ref 4.0–10.5)
nRBC: 0 % (ref 0.0–0.2)

## 2024-03-23 LAB — CMP (CANCER CENTER ONLY)
ALT: 17 U/L (ref 0–44)
AST: 24 U/L (ref 15–41)
Albumin: 3.9 g/dL (ref 3.5–5.0)
Alkaline Phosphatase: 150 U/L — ABNORMAL HIGH (ref 38–126)
Anion gap: 9 (ref 5–15)
BUN: 14 mg/dL (ref 8–23)
CO2: 27 mmol/L (ref 22–32)
Calcium: 9.3 mg/dL (ref 8.9–10.3)
Chloride: 104 mmol/L (ref 98–111)
Creatinine: 0.89 mg/dL (ref 0.44–1.00)
GFR, Estimated: >60 mL/min (ref >60)
Glucose, Bld: 104 mg/dL — ABNORMAL HIGH (ref 70–99)
Potassium: 4.9 mmol/L (ref 3.5–5.1)
Sodium: 140 mmol/L (ref 135–145)
Total Bilirubin: 0.6 mg/dL (ref 0.0–1.2)
Total Protein: 6.8 g/dL (ref 6.5–8.1)

## 2024-03-23 MED ORDER — HYDROXYUREA 500 MG PO CAPS: 500.0000 mg | ORAL_CAPSULE | ORAL | 2 refills | Status: AC

## 2024-03-23 NOTE — Assessment & Plan Note (Addendum)
 Labs reviewed and discussed with patient Stable platelet counts.  Monitor closely. Continue hydroxyurea  to 1000mg  on 2 days per week, and 500mg  daily the rest of the days.

## 2024-03-23 NOTE — Progress Notes (Signed)
 Hematology/Oncology Progress note [phone] [phone]      Patient Care Team: Antonette Angeline ORN, NP as PCP - General (Internal Medicine) Babara Call, MD as Consulting Physician (Oncology)  ASSESSMENT & PLAN:   Essential thrombocythemia [facility]) Labs reviewed and discussed with patient Stable platelet counts.  Monitor closely. Continue hydroxyurea  to 1000mg  on 2 days per week, and 500mg  daily the rest of the days.   Hypocalcemia Improved calcium  level. Recommend patient to continue calcium  supplementation.   Orders Placed This Encounter  Procedures   CMP (Cancer Center only)    Standing Status:   Future    Expected Date:   07/22/2024    Expiration Date:   10/20/2024   CBC with Differential (Cancer Center Only)    Standing Status:   Future    Expected Date:   07/22/2024    Expiration Date:   10/20/2024    Follow up 4 months All questions were answered. The patient knows to call the clinic with any problems, questions or concerns.  Call Babara, MD, PhD [facility] Hematology Oncology 03/23/2024    CHIEF COMPLAINTS/REASON FOR VISIT:  Essential thrombocythemia  HISTORY OF PRESENTING ILLNESS:  Tiffany Richardson is a 78 y.o. female who was seen in consultation at the request of Baity, Angeline ORN, NP for evaluation of thromcbocytosis Reviewed patient's labs.  08/07/2020 labs showed elevated platelet counts at 904,000.  Normal wbc  and hemoglobin   Reviewed patient's previous labs. Thrombocytosis onset is chronic onset , duration since at least 2017 No aggravating or elevated factors. Associated symptoms or signs:  Denies weight loss, fever, chills, fatigue, night sweats.  Context:  Smoking history: Denies Family history of polycythemia.  Denies.  However his father had a history of needing frequent blood removal History of iron deficiency anemia: Denies History of DVT: Denies She also reports chronic abdominal bloating.  She has IBS.  Denies any unintentional weight  loss, fever, nausea vomiting.  No recent colonoscopy in current EMR. She is widowed, husband had colon cancer.  # Jak 2 V61F mutation positive 08/21/2020 bone marrow biopsy results showed hypercellular bone marrow for age with features of myeloproliferative neoplasm.  Consistent with essential thrombocythemia, iron deficient polycythemia vera, pretty fibrotic/early primary myelofibrosis. Patient has no erythrocytosis.  No chronic leukocytosis.  Clinically most consistent with essential thrombocythemia.  # Chronic bloating and abdominal discomfort, possible due to chronic IBS symptoms.  Last colonoscopy 2013. Patient was seen by Dr. Viktoria in 2019 and was recommended to increase soluble fiber, MiraLAX, if not helpful trial of FODMAP diet and probiotics.  Discussed with patient that I recommend surveillance colonoscopy.  07/06/2021, CT scan showed constipation.  Otherwise no acute intra-a patient bdominal or intrapelvic abnormality. Reports alternate of constipation and diarrhea.  Not associated with any food or medication.  She had syncope in March 2025 and was admitted for evaluation.  She also had nausea vomiting which was felt to be due to cholelithiasis without cholecystitis..  She had a negative CT scan without contrast. 06/27/2023, CT cervical spine without contrast showed no CT evidence of acute osseous abnormality.  Multiple small lucent lesions within the vertebral bodies, correlate for any history of marrow disease such as myeloma.   INTERVAL HISTORY Tiffany Richardson is a 78 y.o. female who has above history reviewed by me today presents for follow up visit for management of essential thrombocythemia Patient has been on  hydroxyurea  to 1000mg  2 day per week, and 500mg  daily the rest of the days.  12/26/2023  sigmoidoscopy for further evaluation of anorectal abnormal activity on PET scan- internal hemorrhoids.  She has no other new complaints.   Review of Systems  Constitutional:  Negative for  appetite change, chills, fatigue and fever.  HENT:   Negative for hearing loss and voice change.   Eyes:  Negative for eye problems.  Respiratory:  Negative for chest tightness and cough.   Cardiovascular:  Negative for chest pain.  Gastrointestinal:  Negative for abdominal distention, abdominal pain and blood in stool.  Endocrine: Negative for hot flashes.  Genitourinary:  Negative for difficulty urinating and frequency.   Musculoskeletal:  Negative for arthralgias.  Skin:  Negative for itching and rash.  Neurological:  Negative for extremity weakness.  Hematological:  Negative for adenopathy.  Psychiatric/Behavioral:  Negative for confusion.     MEDICAL HISTORY:  Past Medical History:  Diagnosis Date   Allergy    Codiene   Anemia    Arthritis    Cancer (HCC)    basal cell   Emphysema of lung (HCC)    GERD (gastroesophageal reflux disease)    Hyperlipidemia    Hypertension    Symptomatic cholelithiasis 2025    SURGICAL HISTORY: Past Surgical History:  Procedure Laterality Date   basal cell removed     Rt eye   BIOPSY  12/19/2022   Procedure: BIOPSY;  Surgeon: Onita Elspeth Sharper, DO;  Location: [facility] ENDOSCOPY;  Service: Gastroenterology;;   COLONOSCOPY     COLONOSCOPY WITH PROPOFOL  N/A 12/19/2022   Procedure: COLONOSCOPY WITH PROPOFOL ;  Surgeon: Onita Elspeth Sharper, DO;  Location: [facility] ENDOSCOPY;  Service: Gastroenterology;  Laterality: N/A;   ESOPHAGOGASTRODUODENOSCOPY (EGD) WITH PROPOFOL  N/A 12/19/2022   Procedure: ESOPHAGOGASTRODUODENOSCOPY (EGD) WITH PROPOFOL ;  Surgeon: Onita Elspeth Sharper, DO;  Location: [facility] ENDOSCOPY;  Service: Gastroenterology;  Laterality: N/A;   FLEXIBLE SIGMOIDOSCOPY N/A 12/29/2023   Procedure: KINGSTON SIDE;  Surgeon: Onita Elspeth Sharper, DO;  Location: [facility] ENDOSCOPY;  Service: Gastroenterology;  Laterality: N/A;   POLYPECTOMY  12/19/2022   Procedure: POLYPECTOMY;  Surgeon: Onita Elspeth Sharper, DO;  Location: [facility] ENDOSCOPY;   Service: Gastroenterology;;   squamous cell removed       SOCIAL HISTORY: Social History   Socioeconomic History   Marital status: Widowed    Spouse name: Not on file   Number of children: Not on file   Years of education: Not on file   Highest education level: 12th grade  Occupational History   Not on file  Tobacco Use   Smoking status: Never   Smokeless tobacco: Never  Vaping Use   Vaping status: Never Used  Substance and Sexual Activity   Alcohol use: No   Drug use: No   Sexual activity: Not Currently  Other Topics Concern   Not on file  Social History Narrative   Lives with family   Social Drivers of Health   Financial Resource Strain: Low Risk  (05/22/2023)   Overall Financial Resource Strain (CARDIA)    Difficulty of Paying Living Expenses: Not hard at all  Food Insecurity: No Food Insecurity (06/28/2023)   Hunger Vital Sign    Worried About Running Out of Food in the Last Year: Never true    Ran Out of Food in the Last Year: Never true  Transportation Needs: No Transportation Needs (06/28/2023)   PRAPARE - Administrator, Civil Service (Medical): No    Lack of Transportation (Non-Medical): No  Physical Activity: Insufficiently Active (05/22/2023)   Exercise Vital Sign  Days of Exercise per Week: 4 days    Minutes of Exercise per Session: 30 min  Stress: No Stress Concern Present (05/22/2023)   [employer] of Occupational Health - Occupational Stress Questionnaire    Feeling of Stress : Only a little  Social Connections: Moderately Integrated (06/28/2023)   Social Connection and Isolation Panel    Frequency of Communication with Friends and Family: More than three times a week    Frequency of Social Gatherings with Friends and Family: Twice a week    Attends Religious Services: More than 4 times per year    Active Member of [employer] or Organizations: Yes    Attends Banker Meetings: More than 4 times per year    Marital Status: Widowed   Intimate Partner Violence: Not At Risk (06/28/2023)   Humiliation, Afraid, Rape, and Kick questionnaire    Fear of Current or Ex-Partner: No    Emotionally Abused: No    Physically Abused: No    Sexually Abused: No    FAMILY HISTORY: Family History  Problem Relation Age of Onset   Leukemia Mother    Heart disease Father    Non-Hodgkin's lymphoma Daughter     ALLERGIES:  is allergic to codeine.  MEDICATIONS:  Current Outpatient Medications  Medication Sig Dispense Refill   aspirin  EC 81 MG tablet Take 81 mg by mouth.     atorvastatin  (LIPITOR) 40 MG tablet TAKE 1 TABLET BY MOUTH EVERY DAY 90 tablet 0   fluticasone  (FLONASE ) 50 MCG/ACT nasal spray SPRAY 2 SPRAYS INTO EACH NOSTRIL EVERY DAY 48 mL 0   hydroxyurea  (HYDREA ) 500 MG capsule Take 1 capsule (500 mg total) by mouth See admin instructions. Take 1000mg  on Wednesdays and Sundays, and take 500mg  daily for the rest of the week. 94 capsule 2   hyoscyamine  (LEVSIN  SL) 0.125 MG SL tablet Place under the tongue every 6 (six) hours as needed.     losartan  (COZAAR ) 25 MG tablet TAKE 1 TABLET (25 MG TOTAL) BY MOUTH DAILY. 90 tablet 1   metoprolol  succinate (TOPROL -XL) 50 MG 24 hr tablet TAKE 1 TABLET BY MOUTH EVERY DAY WITH OR IMMEDIATELY FOLLOWING A MEAL 90 tablet 0   omeprazole  (PRILOSEC) 40 MG capsule TAKE 1 CAPSULE (40 MG TOTAL) BY MOUTH IN THE MORNING AND AT BEDTIME. 180 capsule 1   No current facility-administered medications for this visit.     PHYSICAL EXAMINATION: ECOG PERFORMANCE STATUS: 0 - Asymptomatic Vitals:   03/23/24 1408 03/23/24 1414  BP: (!) 147/85 (!) 142/75  Pulse: 73   Resp: 18   Temp: (!) 96 F (35.6 C)   SpO2: 98%    Filed Weights   03/23/24 1408  Weight: 141 lb 1.6 oz (64 kg)    Physical Exam Constitutional:      General: She is not in acute distress. HENT:     Head: Normocephalic.  Eyes:     General: No scleral icterus. Cardiovascular:     Rate and Rhythm: Normal rate.  Pulmonary:      Effort: Pulmonary effort is normal. No respiratory distress.  Abdominal:     General: There is no distension.  Musculoskeletal:        General: No deformity. Normal range of motion.     Cervical back: Normal range of motion.  Skin:    General: Skin is warm and dry.     Findings: No erythema or rash.  Neurological:     Mental Status: She is alert and  oriented to person, place, and time. Mental status is at baseline.  Psychiatric:        Mood and Affect: Mood normal.      LABORATORY DATA:  I have reviewed the data as listed    Latest Ref Rng & Units 03/23/2024    1:26 PM 12/24/2023   12:58 PM 09/03/2023    1:34 PM  CBC  WBC 4.0 - 10.5 K/uL 5.9  5.2  5.0   Hemoglobin 12.0 - 15.0 g/dL 87.4  87.3  86.8   Hematocrit 36.0 - 46.0 % 35.6  36.4  37.6   Platelets 150 - 400 K/uL 355  419  398       Latest Ref Rng & Units 03/23/2024    1:26 PM 12/24/2023   12:58 PM 09/03/2023    1:35 PM  CMP  Glucose 70 - 99 mg/dL 895  86  95   BUN 8 - 23 mg/dL 14  14  15    Creatinine 0.44 - 1.00 mg/dL 9.10  9.10  9.05   Sodium 135 - 145 mmol/L 140  137  137   Potassium 3.5 - 5.1 mmol/L 4.9  4.0  4.7   Chloride 98 - 111 mmol/L 104  105  105   CO2 22 - 32 mmol/L 27  23  26    Calcium  8.9 - 10.3 mg/dL 9.3  8.8  8.6   Total Protein 6.5 - 8.1 g/dL 6.8  6.5  6.6   Total Bilirubin 0.0 - 1.2 mg/dL 0.6  1.0  0.8   Alkaline Phos 38 - 126 U/L 150  126  125   AST 15 - 41 U/L 24  25  48   ALT 0 - 44 U/L 17  18  44    Lab Results  Component Value Date   IRON 85 08/15/2020   TIBC 337 08/15/2020   FERRITIN 31 08/15/2020       RADIOGRAPHIC STUDIES: I have personally reviewed the radiological images as listed and agreed with the findings in the report. No results found.

## 2024-03-23 NOTE — Assessment & Plan Note (Addendum)
 Improved calcium  level. Recommend patient to continue calcium  supplementation.

## 2024-04-02 ENCOUNTER — Ambulatory Visit (INDEPENDENT_AMBULATORY_CARE_PROVIDER_SITE_OTHER): Admitting: Internal Medicine

## 2024-04-02 ENCOUNTER — Encounter: Payer: Self-pay | Admitting: Internal Medicine

## 2024-04-02 VITALS — BP 128/72 | Ht 64.0 in | Wt 139.8 lb

## 2024-04-02 DIAGNOSIS — H6991 Unspecified Eustachian tube disorder, right ear: Secondary | ICD-10-CM | POA: Diagnosis not present

## 2024-04-02 MED ORDER — ATORVASTATIN CALCIUM 40 MG PO TABS: 40.0000 mg | ORAL_TABLET | Freq: Every day | ORAL | 0 refills | Status: AC

## 2024-04-02 MED ORDER — PREDNISONE 10 MG PO TABS: ORAL_TABLET | ORAL | 0 refills | Status: AC

## 2024-04-02 NOTE — Patient Instructions (Signed)
 Eustachian Tube Dysfunction  Eustachian tube dysfunction refers to a condition in which a blockage develops in the narrow passage that connects the middle ear to the back of the nose (eustachian tube). The eustachian tube regulates air pressure in the middle ear by letting air move between the ear and nose. It also helps to drain fluid from the middle ear space. Eustachian tube dysfunction can affect one or both ears. When the eustachian tube does not function properly, air pressure, fluid, or both can build up in the middle ear. What are the causes? This condition occurs when the eustachian tube becomes blocked or cannot open normally. Common causes of this condition include: Ear infections. Colds and other infections that affect the nose, mouth, and throat (upper respiratory tract). Allergies. Irritation from cigarette smoke. Irritation from stomach acid coming up into the esophagus (gastroesophageal reflux). The esophagus is the part of the body that moves food from the mouth to the stomach. Sudden changes in air pressure, such as from descending in an airplane or scuba diving. Abnormal growths in the nose or throat, such as: Growths that line the nose (nasal polyps). Abnormal growth of cells (tumors). Enlarged tissue at the back of the throat (adenoids). What increases the risk? You are more likely to develop this condition if: You smoke. You are overweight. You are a child who has: Certain birth defects of the mouth, such as cleft palate. Large tonsils or adenoids. What are the signs or symptoms? Common symptoms of this condition include: A feeling of fullness in the ear. Ear pain. Clicking or popping noises in the ear. Ringing in the ear (tinnitus). Hearing loss. Loss of balance. Dizziness. Symptoms may get worse when the air pressure around you changes, such as when you travel to an area of high elevation, fly on an airplane, or go scuba diving. How is this diagnosed? This  condition may be diagnosed based on: Your symptoms. A physical exam of your ears, nose, and throat. Tests, such as those that measure: The movement of your eardrum. Your hearing (audiometry). How is this treated? Treatment depends on the cause and severity of your condition. In mild cases, you may relieve your symptoms by moving air into your ears. This is called "popping the ears." In more severe cases, or if you have symptoms of fluid in your ears, treatment may include: Medicines to relieve congestion (decongestants). Medicines that treat allergies (antihistamines). Nasal sprays or ear drops that contain medicines that reduce swelling (steroids). A procedure to drain the fluid in your eardrum. In this procedure, a small tube may be placed in the eardrum to: Drain the fluid. Restore the air in the middle ear space. A procedure to insert a balloon device through the nose to inflate the opening of the eustachian tube (balloon dilation). Follow these instructions at home: Lifestyle Do not do any of the following until your health care provider approves: Travel to high altitudes. Fly in airplanes. Work in a Estate agent or room. Scuba dive. Do not use any products that contain nicotine or tobacco. These products include cigarettes, chewing tobacco, and vaping devices, such as e-cigarettes. If you need help quitting, ask your health care provider. Keep your ears dry. Wear fitted earplugs during showering and bathing. Dry your ears completely after. General instructions Take over-the-counter and prescription medicines only as told by your health care provider. Use techniques to help pop your ears as recommended by your health care provider. These may include: Chewing gum. Yawning. Frequent, forceful swallowing.  Closing your mouth, holding your nose closed, and gently blowing as if you are trying to blow air out of your nose. Keep all follow-up visits. This is important. Contact a  health care provider if: Your symptoms do not go away after treatment. Your symptoms come back after treatment. You are unable to pop your ears. You have: A fever. Pain in your ear. Pain in your head or neck. Fluid draining from your ear. Your hearing suddenly changes. You become very dizzy. You lose your balance. Get help right away if: You have a sudden, severe increase in any of your symptoms. Summary Eustachian tube dysfunction refers to a condition in which a blockage develops in the eustachian tube. It can be caused by ear infections, allergies, inhaled irritants, or abnormal growths in the nose or throat. Symptoms may include ear pain or fullness, hearing loss, or ringing in the ears. Mild cases are treated with techniques to unblock the ears, such as yawning or chewing gum. More severe cases are treated with medicines or procedures. This information is not intended to replace advice given to you by your health care provider. Make sure you discuss any questions you have with your health care provider. Document Revised: 06/19/2020 Document Reviewed: 06/19/2020 Elsevier Patient Education  2024 [employer].

## 2024-04-02 NOTE — Progress Notes (Signed)
 Subjective:    Patient ID: Tiffany Richardson, female    DOB: 16-Aug-1945, 78 y.o.   MRN: [id number]  HPI  Discussed the use of AI scribe software for clinical note transcription with the patient, who gave verbal consent to proceed.  Tiffany Richardson is a 78 year old female who presents with right ear pain.  She has been experiencing right ear pain for approximately one week. The pain is occasional and associated with a sensation of pressure, particularly when lying down. There is no drainage or loss of hearing, although others have commented on her hearing ability, attributing it to her age.  No fever, chills, or body aches beyond what she considers typical. She experiences a scratchy throat in the mornings, which resolves after drinking fluids. She takes an allergy pill regularly, which she believes helps with her symptoms.  She recalls feeling dizzy a couple of weeks ago, which she attributed to reading on her tablet without adequate lighting, rather than vertigo.    Review of Systems   Past Medical History:  Diagnosis Date   Allergy    Codiene   Anemia    Arthritis    Cancer (HCC)    basal cell   Emphysema of lung (HCC)    GERD (gastroesophageal reflux disease)    Hyperlipidemia    Hypertension    Symptomatic cholelithiasis 2025    Current Outpatient Medications  Medication Sig Dispense Refill   aspirin  EC 81 MG tablet Take 81 mg by mouth.     atorvastatin  (LIPITOR) 40 MG tablet TAKE 1 TABLET BY MOUTH EVERY DAY 90 tablet 0   fluticasone  (FLONASE ) 50 MCG/ACT nasal spray SPRAY 2 SPRAYS INTO EACH NOSTRIL EVERY DAY 48 mL 0   hydroxyurea  (HYDREA ) 500 MG capsule Take 1 capsule (500 mg total) by mouth See admin instructions. Take 1000mg  on Wednesdays and Sundays, and take 500mg  daily for the rest of the week. 94 capsule 2   hyoscyamine  (LEVSIN  SL) 0.125 MG SL tablet Place under the tongue every 6 (six) hours as needed.     losartan  (COZAAR ) 25 MG tablet TAKE 1 TABLET (25 MG TOTAL) BY  MOUTH DAILY. 90 tablet 1   metoprolol  succinate (TOPROL -XL) 50 MG 24 hr tablet TAKE 1 TABLET BY MOUTH EVERY DAY WITH OR IMMEDIATELY FOLLOWING A MEAL 90 tablet 0   omeprazole  (PRILOSEC) 40 MG capsule TAKE 1 CAPSULE (40 MG TOTAL) BY MOUTH IN THE MORNING AND AT BEDTIME. 180 capsule 1   No current facility-administered medications for this visit.    Allergies[1]  Family History  Problem Relation Age of Onset   Leukemia Mother    Heart disease Father    Non-Hodgkin's lymphoma Daughter     Social History   Socioeconomic History   Marital status: Widowed    Spouse name: Not on file   Number of children: Not on file   Years of education: Not on file   Highest education level: GED or equivalent  Occupational History   Not on file  Tobacco Use   Smoking status: Never   Smokeless tobacco: Never  Vaping Use   Vaping status: Never Used  Substance and Sexual Activity   Alcohol use: No   Drug use: No   Sexual activity: Not Currently  Other Topics Concern   Not on file  Social History Narrative   Lives with family   Social Drivers of Health   Tobacco Use: Low Risk (03/23/2024)   Patient History    Smoking  Tobacco Use: Never    Smokeless Tobacco Use: Never    Passive Exposure: Not on file  Financial Resource Strain: Low Risk (04/01/2024)   Overall Financial Resource Strain (CARDIA)    Difficulty of Paying Living Expenses: Not hard at all  Food Insecurity: No Food Insecurity (04/01/2024)   Epic    Worried About Programme Researcher, Broadcasting/film/video in the Last Year: Never true    Ran Out of Food in the Last Year: Never true  Transportation Needs: No Transportation Needs (04/01/2024)   Epic    Lack of Transportation (Medical): No    Lack of Transportation (Non-Medical): No  Physical Activity: Insufficiently Active (04/01/2024)   Exercise Vital Sign    Days of Exercise per Week: 3 days    Minutes of Exercise per Session: 20 min  Stress: Stress Concern Present (04/01/2024)   [employer]  of Occupational Health - Occupational Stress Questionnaire    Feeling of Stress: To some extent  Social Connections: Moderately Integrated (04/01/2024)   Social Connection and Isolation Panel    Frequency of Communication with Friends and Family: More than three times a week    Frequency of Social Gatherings with Friends and Family: More than three times a week    Attends Religious Services: More than 4 times per year    Active Member of [employer] or Organizations: Yes    Attends Banker Meetings: More than 4 times per year    Marital Status: Widowed  Intimate Partner Violence: Not At Risk (06/28/2023)   Humiliation, Afraid, Rape, and Kick questionnaire    Fear of Current or Ex-Partner: No    Emotionally Abused: No    Physically Abused: No    Sexually Abused: No  Depression (PHQ2-9): Low Risk (12/24/2023)   Depression (PHQ2-9)    PHQ-2 Score: 0  Alcohol Screen: Low Risk (08/06/2021)   Alcohol Screen    Last Alcohol Screening Score (AUDIT): 0  Housing: Low Risk (04/01/2024)   Epic    Unable to Pay for Housing in the Last Year: No    Number of Times Moved in the Last Year: 0    Homeless in the Last Year: No  Utilities: Not At Risk (06/28/2023)   AHC Utilities    Threatened with loss of utilities: No  Health Literacy: Not on file     Constitutional: Denies fever, malaise, fatigue, headache or abrupt weight changes.  HEENT: Patient reports reports ear pain.  Denies eye pain, eye redness, ringing in the ears, wax buildup, runny nose, nasal congestion, bloody nose, or sore throat. Respiratory: Denies difficulty breathing, shortness of breath, cough or sputum production.   Cardiovascular: Denies chest pain, chest tightness, palpitations or swelling in the hands or feet.  Gastrointestinal: Denies abdominal pain, bloating, constipation, diarrhea or blood in the stool.  GU: Denies urgency, frequency, pain with urination, burning sensation, blood in urine, odor or  discharge. Musculoskeletal: Denies decrease in range of motion, difficulty with gait, muscle pain or joint pain and swelling.  Skin: Denies redness, rashes, lesions or ulcercations.  Neurological: Denies dizziness, difficulty with memory, difficulty with speech or problems with balance and coordination.  Psych: Denies anxiety, depression, SI/HI.  No other specific complaints in a complete review of systems (except as listed in HPI above).      Objective:   Physical Exam  BP 128/72 (BP Location: Right Arm, Patient Position: Sitting, Cuff Size: Normal)   Ht 5' 4 (1.626 m)   Wt 139 lb 12.8 oz (63.4  kg)   BMI 24.00 kg/m   Wt Readings from Last 3 Encounters:  03/23/24 141 lb 1.6 oz (64 kg)  12/29/23 136 lb 3.2 oz (61.8 kg)  12/24/23 141 lb (64 kg)    General: Appears her stated age, well developed, well nourished in NAD. Skin: Warm, dry and intact.  HEENT: Head: normal shape and size; Eyes: sclera white, no icterus, conjunctiva pink, PERRLA and EOMs intact; right ears: Tm's gray and intact, normal light reflex, + serous effusion;  Cardiovascular: Normal rate. Pulmonary/Chest: Normal effort. No respiratory distress.  Musculoskeletal:  No difficulty with gait.  Neurological: Alert and oriented. Coordination normal.    BMET    Component Value Date/Time   NA 140 03/23/2024 1326   NA 142 10/11/2016 0932   K 4.9 03/23/2024 1326   CL 104 03/23/2024 1326   CO2 27 03/23/2024 1326   GLUCOSE 104 (H) 03/23/2024 1326   BUN 14 03/23/2024 1326   BUN 12 10/11/2016 0932   CREATININE 0.89 03/23/2024 1326   CREATININE 0.90 04/02/2021 0840   CALCIUM  9.3 03/23/2024 1326   GFRNONAA >60 03/23/2024 1326   GFRNONAA 63 08/07/2020 1001   GFRAA 74 08/07/2020 1001    Lipid Panel     Component Value Date/Time   CHOL 124 09/02/2022 0810   CHOL 220 (H) 10/11/2016 0932   TRIG 168 (H) 09/02/2022 0810   HDL 34 (L) 09/02/2022 0810   HDL 42 10/11/2016 0932   CHOLHDL 3.6 09/02/2022 0810   LDLCALC  65 09/02/2022 0810    CBC    Component Value Date/Time   WBC 5.9 03/23/2024 1326   WBC 6.5 06/30/2023 0512   RBC 3.04 (L) 03/23/2024 1326   HGB 12.5 03/23/2024 1326   HGB 13.0 05/29/2015 1404   HCT 35.6 (L) 03/23/2024 1326   HCT 39.0 05/29/2015 1404   PLT 355 03/23/2024 1326   PLT 684 (H) 05/29/2015 1404   MCV 117.1 (H) 03/23/2024 1326   MCV 90 05/29/2015 1404   MCH 41.1 (H) 03/23/2024 1326   MCHC 35.1 03/23/2024 1326   RDW 13.8 03/23/2024 1326   RDW 13.6 05/29/2015 1404   LYMPHSABS 2.1 03/23/2024 1326   LYMPHSABS 3.5 (H) 05/29/2015 1404   MONOABS 0.4 03/23/2024 1326   EOSABS 0.1 03/23/2024 1326   EOSABS 0.3 05/29/2015 1404   BASOSABS 0.1 03/23/2024 1326   BASOSABS 0.1 05/29/2015 1404    Hgb A1C No results found for: HGBA1C          Assessment & Plan:  Assessment and Plan    Right eustachian tube dysfunction with otalgia Right ear pain with clear fluid behind the eardrum, indicating eustachian tube dysfunction. No infection. Likely related to allergies and sinus issues. Current antihistamine use is insufficient. - Increase Flonase  to twice daily for one week. - Prescribed oral prednisone 10 mg taper as a backup if Flonase  is ineffective. - Advised on the Valsalva maneuver for potential relief, though she finds it uncomfortable.   Schedule an appointment for your annual exam Angeline Laura, NP      [1]  Allergies Allergen Reactions   Codeine Swelling

## 2024-04-06 ENCOUNTER — Other Ambulatory Visit: Payer: Self-pay | Admitting: Internal Medicine

## 2024-04-08 ENCOUNTER — Ambulatory Visit: Payer: Self-pay | Admitting: *Deleted

## 2024-04-08 NOTE — Telephone Encounter (Signed)
 Requested by interface surescripts. Courtesy refill. Future visit 04/29/24. Requested Prescriptions  Pending Prescriptions Disp Refills   losartan  (COZAAR ) 25 MG tablet [Pharmacy Med Name: LOSARTAN  POTASSIUM 25 MG TAB] 90 tablet 0    Sig: TAKE 1 TABLET (25 MG TOTAL) BY MOUTH DAILY.     Cardiovascular:  Angiotensin Receptor Blockers Failed - 04/08/2024  2:12 PM      Failed - Valid encounter within last 6 months    Recent Outpatient Visits           6 days ago ETD (Eustachian tube dysfunction), right   [facility] [place], [place] W, NP   8 months ago Calculus of gallbladder without cholecystitis without obstruction   [facility] [place], Angeline ORN, NP   9 months ago Gallstones   [facility] [place], [place], [place]              Passed - Cr in normal range and within 180 days    Creatinine  Date Value Ref Range Status  03/23/2024 0.89 0.44 - 1.00 mg/dL Final   Creat  Date Value Ref Range Status  04/02/2021 0.90 0.60 - 1.00 mg/dL Final         Passed - K in normal range and within 180 days    Potassium  Date Value Ref Range Status  03/23/2024 4.9 3.5 - 5.1 mmol/L Final         Passed - Patient is not pregnant      Passed - Last BP in normal range    BP Readings from Last 1 Encounters:  04/02/24 128/72

## 2024-04-08 NOTE — Progress Notes (Unsigned)
 Subjective:    Patient ID: Tiffany Richardson, female    DOB: Jun 15, 1945, 78 y.o.   MRN: [id number]  HPI    Review of Systems   Past Medical History:  Diagnosis Date   Allergy    Codiene   Anemia    Arthritis    Cancer (HCC)    basal cell   Emphysema of lung (HCC)    GERD (gastroesophageal reflux disease)    Hyperlipidemia    Hypertension    Symptomatic cholelithiasis 2025    Current Outpatient Medications  Medication Sig Dispense Refill   aspirin  EC 81 MG tablet Take 81 mg by mouth.     atorvastatin  (LIPITOR) 40 MG tablet Take 1 tablet (40 mg total) by mouth daily. 90 tablet 0   fluticasone  (FLONASE ) 50 MCG/ACT nasal spray SPRAY 2 SPRAYS INTO EACH NOSTRIL EVERY DAY 48 mL 0   hydroxyurea  (HYDREA ) 500 MG capsule Take 1 capsule (500 mg total) by mouth See admin instructions. Take 1000mg  on Wednesdays and Sundays, and take 500mg  daily for the rest of the week. 94 capsule 2   hyoscyamine  (LEVSIN  SL) 0.125 MG SL tablet Place under the tongue every 6 (six) hours as needed.     losartan  (COZAAR ) 25 MG tablet TAKE 1 TABLET (25 MG TOTAL) BY MOUTH DAILY. 90 tablet 1   metoprolol  succinate (TOPROL -XL) 50 MG 24 hr tablet TAKE 1 TABLET BY MOUTH EVERY DAY WITH OR IMMEDIATELY FOLLOWING A MEAL 90 tablet 0   omeprazole  (PRILOSEC) 40 MG capsule TAKE 1 CAPSULE (40 MG TOTAL) BY MOUTH IN THE MORNING AND AT BEDTIME. 180 capsule 1   predniSONE  (DELTASONE ) 10 MG tablet Take 6 tabs on day 1, 5 tabs on day 2, 4 tabs on day 3, 3 tabs on day 4, 2 tabs on day 5, 1 tab on day 6 21 tablet 0   No current facility-administered medications for this visit.    Allergies[1]  Family History  Problem Relation Age of Onset   Leukemia Mother    Heart disease Father    Non-Hodgkin's lymphoma Daughter     Social History   Socioeconomic History   Marital status: Widowed    Spouse name: Not on file   Number of children: Not on file   Years of education: Not on file   Highest education level: GED or  equivalent  Occupational History   Not on file  Tobacco Use   Smoking status: Never   Smokeless tobacco: Never  Vaping Use   Vaping status: Never Used  Substance and Sexual Activity   Alcohol use: No   Drug use: No   Sexual activity: Not Currently  Other Topics Concern   Not on file  Social History Narrative   Lives with family   Social Drivers of Health   Tobacco Use: Low Risk (04/02/2024)   Patient History    Smoking Tobacco Use: Never    Smokeless Tobacco Use: Never    Passive Exposure: Not on file  Financial Resource Strain: Low Risk (04/01/2024)   Overall Financial Resource Strain (CARDIA)    Difficulty of Paying Living Expenses: Not hard at all  Food Insecurity: No Food Insecurity (04/01/2024)   Epic    Worried About Radiation Protection Practitioner of Food in the Last Year: Never true    Ran Out of Food in the Last Year: Never true  Transportation Needs: No Transportation Needs (04/01/2024)   Epic    Lack of Transportation (Medical): No    Lack  of Transportation (Non-Medical): No  Physical Activity: Insufficiently Active (04/01/2024)   Exercise Vital Sign    Days of Exercise per Week: 3 days    Minutes of Exercise per Session: 20 min  Stress: Stress Concern Present (04/01/2024)   [employer] of Occupational Health - Occupational Stress Questionnaire    Feeling of Stress: To some extent  Social Connections: Moderately Integrated (04/01/2024)   Social Connection and Isolation Panel    Frequency of Communication with Friends and Family: More than three times a week    Frequency of Social Gatherings with Friends and Family: More than three times a week    Attends Religious Services: More than 4 times per year    Active Member of [employer] or Organizations: Yes    Attends Banker Meetings: More than 4 times per year    Marital Status: Widowed  Intimate Partner Violence: Not At Risk (06/28/2023)   Humiliation, Afraid, Rape, and Kick questionnaire    Fear of Current or  Ex-Partner: No    Emotionally Abused: No    Physically Abused: No    Sexually Abused: No  Depression (PHQ2-9): Low Risk (04/02/2024)   Depression (PHQ2-9)    PHQ-2 Score: 0  Alcohol Screen: Low Risk (08/06/2021)   Alcohol Screen    Last Alcohol Screening Score (AUDIT): 0  Housing: Low Risk (04/01/2024)   Epic    Unable to Pay for Housing in the Last Year: No    Number of Times Moved in the Last Year: 0    Homeless in the Last Year: No  Utilities: Not At Risk (06/28/2023)   AHC Utilities    Threatened with loss of utilities: No  Health Literacy: Not on file     Constitutional: Denies fever, malaise, fatigue, headache or abrupt weight changes.  HEENT: Patient reports sore throat.  Denies eye pain, eye redness, ear pain, ringing in the ears, wax buildup, runny nose, nasal congestion, bloody nose. Respiratory: Denies difficulty breathing, shortness of breath, cough or sputum production.   Cardiovascular: Denies chest pain, chest tightness, palpitations or swelling in the hands or feet.  Gastrointestinal: Denies abdominal pain, bloating, constipation, diarrhea or blood in the stool.  GU: Denies urgency, frequency, pain with urination, burning sensation, blood in urine, odor or discharge. Musculoskeletal: Denies decrease in range of motion, difficulty with gait, muscle pain or joint pain and swelling.  Skin: Denies redness, rashes, lesions or ulcercations.  Neurological: Denies dizziness, difficulty with memory, difficulty with speech or problems with balance and coordination.  Psych: Denies anxiety, depression, SI/HI.  No other specific complaints in a complete review of systems (except as listed in HPI above).      Objective:   Physical Exam  There were no vitals taken for this visit. Wt Readings from Last 3 Encounters:  04/02/24 139 lb 12.8 oz (63.4 kg)  03/23/24 141 lb 1.6 oz (64 kg)  12/29/23 136 lb 3.2 oz (61.8 kg)    General: Appears their stated age, well developed, well  nourished in NAD. Skin: Warm, dry and intact. No rashes, lesions or ulcerations noted. HEENT: Head: normal shape and size; Eyes: sclera white, no icterus, conjunctiva pink, PERRLA and EOMs intact; Ears: Tm's gray and intact, normal light reflex; Nose: mucosa pink and moist, septum midline; Throat/Mouth: Teeth present, mucosa pink and moist, no exudate, lesions or ulcerations noted.  Neck:  Neck supple, trachea midline. No masses, lumps or thyromegaly present.  Cardiovascular: Normal rate and rhythm. S1,S2 noted.  No murmur, rubs  or gallops noted. No JVD or BLE edema. No carotid bruits noted. Pulmonary/Chest: Normal effort and positive vesicular breath sounds. No respiratory distress. No wheezes, rales or ronchi noted.  Abdomen: Soft and nontender. Normal bowel sounds. No distention or masses noted. Liver, spleen and kidneys non palpable. Musculoskeletal: Normal range of motion. No signs of joint swelling. No difficulty with gait.  Neurological: Alert and oriented. Cranial nerves II-XII grossly intact. Coordination normal.  Psychiatric: Mood and affect normal. Behavior is normal. Judgment and thought content normal.    BMET    Component Value Date/Time   NA 140 03/23/2024 1326   NA 142 10/11/2016 0932   K 4.9 03/23/2024 1326   CL 104 03/23/2024 1326   CO2 27 03/23/2024 1326   GLUCOSE 104 (H) 03/23/2024 1326   BUN 14 03/23/2024 1326   BUN 12 10/11/2016 0932   CREATININE 0.89 03/23/2024 1326   CREATININE 0.90 04/02/2021 0840   CALCIUM  9.3 03/23/2024 1326   GFRNONAA >60 03/23/2024 1326   GFRNONAA 63 08/07/2020 1001   GFRAA 74 08/07/2020 1001    Lipid Panel     Component Value Date/Time   CHOL 124 09/02/2022 0810   CHOL 220 (H) 10/11/2016 0932   TRIG 168 (H) 09/02/2022 0810   HDL 34 (L) 09/02/2022 0810   HDL 42 10/11/2016 0932   CHOLHDL 3.6 09/02/2022 0810   LDLCALC 65 09/02/2022 0810    CBC    Component Value Date/Time   WBC 5.9 03/23/2024 1326   WBC 6.5 06/30/2023 0512    RBC 3.04 (L) 03/23/2024 1326   HGB 12.5 03/23/2024 1326   HGB 13.0 05/29/2015 1404   HCT 35.6 (L) 03/23/2024 1326   HCT 39.0 05/29/2015 1404   PLT 355 03/23/2024 1326   PLT 684 (H) 05/29/2015 1404   MCV 117.1 (H) 03/23/2024 1326   MCV 90 05/29/2015 1404   MCH 41.1 (H) 03/23/2024 1326   MCHC 35.1 03/23/2024 1326   RDW 13.8 03/23/2024 1326   RDW 13.6 05/29/2015 1404   LYMPHSABS 2.1 03/23/2024 1326   LYMPHSABS 3.5 (H) 05/29/2015 1404   MONOABS 0.4 03/23/2024 1326   EOSABS 0.1 03/23/2024 1326   EOSABS 0.3 05/29/2015 1404   BASOSABS 0.1 03/23/2024 1326   BASOSABS 0.1 05/29/2015 1404    Hgb A1C No results found for: HGBA1C          Assessment & Plan:    RTC in 1 month for your annual exam Angeline Laura, NP     [1]  Allergies Allergen Reactions   Codeine Swelling

## 2024-04-08 NOTE — Telephone Encounter (Signed)
 FYI Only or Action Required?: Action required by provider: clinical question for provider, update on patient condition, and exposure to strep throat and patient requesting if medication can be prescribed or if another OV needed..  Patient was last seen in primary care on 04/02/2024 by Antonette Angeline ORN, NP.  Called Nurse Triage reporting Sore Throat.  Symptoms began several days ago.  Interventions attempted: Rest, hydration, or home remedies.  Symptoms are: rapidly worsening.  Triage Disposition: See Physician Within 24 Hours  Patient/caregiver understands and will follow disposition?: No, wishes to speak with PCP  Please advise , patient requesting call back. Found out exposed to strep throat .         Copied from CRM #[id number]. Topic: Clinical - Red Word Triage >> Apr 08, 2024  8:40 AM Victoria B wrote: [facility] that prompted transfer to Nurse Triage: throat sore, can hardly swallow Reason for Disposition  SEVERE throat pain (e.g., excruciating)  Answer Assessment - Initial Assessment Questions 1. ONSET: When did the throat start hurting? (Hours or days ago)      Last week  2. SEVERITY: How bad is the sore throat? (Scale 1-10; mild, moderate or severe)     severe 3. STREP EXPOSURE: Has there been any exposure to strep within the past week? If Yes, ask: What type of contact occurred?      Exposure to niece with strep Sunday  4.  VIRAL SYMPTOMS: Are there any symptoms of a cold, such as a runny nose, cough, hoarse voice or red eyes?      Head congestion stopped nose, sore throat , minimal cough , difficulty swallowing  5. FEVER: Do you have a fever? If Yes, ask: What is your temperature, how was it measured, and when did it start?     Na  6. PUS ON THE TONSILS: Is there pus on the tonsils in the back of your throat?     No  7. OTHER SYMPTOMS: Do you have any other symptoms? (e.g., difficulty breathing, headache, rash)     Sore throat , difficulty  swallowing head stopped up . Right ear pain better but continues with some pain  8. PREGNANCY: Is there any chance you are pregnant? When was your last menstrual period?     na  Protocols used: Sore Throat-A-AH  Reason for Disposition  SEVERE throat pain (e.g., excruciating)  Answer Assessment - Initial Assessment Questions Offered appt for tomorrow. Patient reports she was just seen 04/02/24 by PCP and since found out her niece has been dx with strep throat on Sunday and patient has been around her Friday until this week. Sx of sore throat worsening and pain with swallowing worsening but left ear pain better but still noted.  Throat red but no white patches noted. Please advise if PCP wants another OV or if medication can be prescribed due to exposure to strep.  Patient requesting call back        1. ONSET: When did the throat start hurting? (Hours or days ago)      Last week  2. SEVERITY: How bad is the sore throat? (Scale 1-10; mild, moderate or severe)     severe 3. STREP EXPOSURE: Has there been any exposure to strep within the past week? If Yes, ask: What type of contact occurred?      Exposure to niece with strep Sunday  4.  VIRAL SYMPTOMS: Are there any symptoms of a cold, such as a runny nose, cough, hoarse voice or  red eyes?      Head congestion stopped nose, sore throat , minimal cough , difficulty swallowing  5. FEVER: Do you have a fever? If Yes, ask: What is your temperature, how was it measured, and when did it start?     Na  6. PUS ON THE TONSILS: Is there pus on the tonsils in the back of your throat?     No  7. OTHER SYMPTOMS: Do you have any other symptoms? (e.g., difficulty breathing, headache, rash)     Sore throat , difficulty swallowing head stopped up . Right ear pain better but continues with some pain  8. PREGNANCY: Is there any chance you are pregnant? When was your last menstrual period?     na  Protocols used: Sore  Throat-A-AH

## 2024-04-08 NOTE — Telephone Encounter (Signed)
 Spoke to patient appointment scheduled, she will come at 2 pm tomorrow

## 2024-04-09 ENCOUNTER — Ambulatory Visit: Admitting: Internal Medicine

## 2024-04-09 ENCOUNTER — Encounter: Payer: Self-pay | Admitting: Internal Medicine

## 2024-04-09 VITALS — BP 124/78 | Ht 64.0 in | Wt 141.8 lb

## 2024-04-09 DIAGNOSIS — J020 Streptococcal pharyngitis: Secondary | ICD-10-CM

## 2024-04-09 LAB — POCT RAPID STREP A (OFFICE): Rapid Strep A Screen: POSITIVE — AB

## 2024-04-09 MED ORDER — AMOXICILLIN-POT CLAVULANATE 875-125 MG PO TABS: 1.0000 | ORAL_TABLET | Freq: Two times a day (BID) | ORAL | 0 refills | Status: AC

## 2024-04-09 NOTE — Patient Instructions (Signed)

## 2024-04-29 ENCOUNTER — Encounter: Admitting: Internal Medicine

## 2024-05-11 ENCOUNTER — Other Ambulatory Visit: Payer: Self-pay | Admitting: Internal Medicine

## 2024-05-11 DIAGNOSIS — I10 Essential (primary) hypertension: Secondary | ICD-10-CM

## 2024-05-11 NOTE — Telephone Encounter (Signed)
 Requested Prescriptions  Pending Prescriptions Disp Refills   metoprolol  succinate (TOPROL -XL) 50 MG 24 hr tablet [Pharmacy Med Name: METOPROLOL  SUCC ER 50 MG TAB] 90 tablet 0    Sig: TAKE 1 TABLET BY MOUTH EVERY DAY WITH OR IMMEDIATELY FOLLOWING A MEAL     Cardiovascular:  Beta Blockers Passed - 05/11/2024  2:20 PM      Passed - Last BP in normal range    BP Readings from Last 1 Encounters:  04/09/24 124/78         Passed - Last Heart Rate in normal range    Pulse Readings from Last 1 Encounters:  03/23/24 73         Passed - Valid encounter within last 6 months    Recent Outpatient Visits           1 month ago Strep pharyngitis   [place] [facility] [place], Angeline ORN, NP   1 month ago ETD (Eustachian tube dysfunction), right   [facility] [place], [place] W, NP   9 months ago Calculus of gallbladder without cholecystitis without obstruction    [facility] [place], Angeline ORN, NP   10 months ago Gallstones    [facility] [place], Angeline ORN, [place]

## 2024-06-10 ENCOUNTER — Encounter: Admitting: Internal Medicine

## 2024-07-27 ENCOUNTER — Inpatient Hospital Stay: Admitting: Oncology

## 2024-07-27 ENCOUNTER — Inpatient Hospital Stay
